# Patient Record
Sex: Female | Born: 1935
Health system: Southern US, Community
[De-identification: ages and names within clinical notes are randomized; demographics above are authoritative.]

## PROBLEM LIST (undated history)

## (undated) DIAGNOSIS — K589 Irritable bowel syndrome without diarrhea: Secondary | ICD-10-CM

## (undated) DIAGNOSIS — D126 Benign neoplasm of colon, unspecified: Secondary | ICD-10-CM

## (undated) DIAGNOSIS — M1990 Unspecified osteoarthritis, unspecified site: Secondary | ICD-10-CM

## (undated) DIAGNOSIS — E785 Hyperlipidemia, unspecified: Secondary | ICD-10-CM

## (undated) DIAGNOSIS — K219 Gastro-esophageal reflux disease without esophagitis: Secondary | ICD-10-CM

## (undated) DIAGNOSIS — Z860100 Personal history of colon polyps, unspecified: Secondary | ICD-10-CM

## (undated) DIAGNOSIS — K5790 Diverticulosis of intestine, part unspecified, without perforation or abscess without bleeding: Secondary | ICD-10-CM

## (undated) DIAGNOSIS — K649 Unspecified hemorrhoids: Secondary | ICD-10-CM

## (undated) DIAGNOSIS — K7689 Other specified diseases of liver: Secondary | ICD-10-CM

## (undated) DIAGNOSIS — I10 Essential (primary) hypertension: Secondary | ICD-10-CM

## (undated) HISTORY — DX: Personal history of colonic polyps: Z86.010

## (undated) HISTORY — DX: Irritable bowel syndrome, unspecified: K58.9

## (undated) HISTORY — DX: Hyperlipidemia, unspecified: E78.5

## (undated) HISTORY — PX: LUMBAR FUSION: SHX111

## (undated) HISTORY — PX: OTHER SURGICAL HISTORY: SHX169

## (undated) HISTORY — DX: Essential (primary) hypertension: I10

## (undated) HISTORY — DX: Other specified diseases of liver: K76.89

## (undated) HISTORY — DX: Benign neoplasm of colon, unspecified: D12.6

## (undated) HISTORY — DX: Gastro-esophageal reflux disease without esophagitis: K21.9

## (undated) HISTORY — DX: Unspecified hemorrhoids: K64.9

## (undated) HISTORY — DX: Personal history of colon polyps, unspecified: Z86.0100

## (undated) HISTORY — PX: TONSILLECTOMY: SUR1361

## (undated) HISTORY — PX: VAGINAL HYSTERECTOMY: SUR661

## (undated) HISTORY — DX: Diverticulosis of intestine, part unspecified, without perforation or abscess without bleeding: K57.90

## (undated) HISTORY — DX: Unspecified osteoarthritis, unspecified site: M19.90

---

## 1998-01-12 ENCOUNTER — Encounter: Admission: RE | Admit: 1998-01-12 | Discharge: 1998-02-07 | Payer: Self-pay | Admitting: Anesthesiology

## 1998-05-06 ENCOUNTER — Other Ambulatory Visit: Admission: RE | Admit: 1998-05-06 | Discharge: 1998-05-06 | Payer: Self-pay | Admitting: Obstetrics and Gynecology

## 1999-04-05 ENCOUNTER — Encounter: Payer: Self-pay | Admitting: Neurosurgery

## 1999-04-05 ENCOUNTER — Ambulatory Visit (HOSPITAL_COMMUNITY): Admission: RE | Admit: 1999-04-05 | Discharge: 1999-04-05 | Payer: Self-pay | Admitting: Neurosurgery

## 1999-06-26 ENCOUNTER — Encounter: Admission: RE | Admit: 1999-06-26 | Discharge: 1999-06-26 | Payer: Self-pay | Admitting: *Deleted

## 2000-02-23 ENCOUNTER — Encounter: Admission: RE | Admit: 2000-02-23 | Discharge: 2000-02-23 | Payer: Self-pay | Admitting: *Deleted

## 2000-05-01 ENCOUNTER — Encounter: Admission: RE | Admit: 2000-05-01 | Discharge: 2000-05-01 | Payer: Self-pay | Admitting: *Deleted

## 2000-06-01 ENCOUNTER — Encounter: Payer: Self-pay | Admitting: Neurosurgery

## 2000-06-01 ENCOUNTER — Ambulatory Visit (HOSPITAL_COMMUNITY): Admission: RE | Admit: 2000-06-01 | Discharge: 2000-06-01 | Payer: Self-pay | Admitting: Neurosurgery

## 2000-07-01 ENCOUNTER — Encounter: Payer: Self-pay | Admitting: Neurosurgery

## 2000-07-03 ENCOUNTER — Encounter: Payer: Self-pay | Admitting: Neurosurgery

## 2000-07-03 ENCOUNTER — Inpatient Hospital Stay (HOSPITAL_COMMUNITY): Admission: RE | Admit: 2000-07-03 | Discharge: 2000-07-07 | Payer: Self-pay | Admitting: Neurosurgery

## 2000-07-04 ENCOUNTER — Encounter: Payer: Self-pay | Admitting: Neurosurgery

## 2001-05-30 ENCOUNTER — Encounter (INDEPENDENT_AMBULATORY_CARE_PROVIDER_SITE_OTHER): Payer: Self-pay | Admitting: Specialist

## 2001-05-30 ENCOUNTER — Ambulatory Visit (HOSPITAL_COMMUNITY): Admission: RE | Admit: 2001-05-30 | Discharge: 2001-05-30 | Payer: Self-pay | Admitting: Gastroenterology

## 2004-07-12 ENCOUNTER — Ambulatory Visit: Payer: Self-pay | Admitting: Family Medicine

## 2004-07-19 ENCOUNTER — Ambulatory Visit: Payer: Self-pay | Admitting: Family Medicine

## 2004-12-02 ENCOUNTER — Ambulatory Visit: Payer: Self-pay | Admitting: Family Medicine

## 2004-12-18 ENCOUNTER — Ambulatory Visit: Payer: Self-pay | Admitting: Family Medicine

## 2005-01-11 ENCOUNTER — Ambulatory Visit: Payer: Self-pay | Admitting: Family Medicine

## 2005-04-23 ENCOUNTER — Ambulatory Visit: Payer: Self-pay | Admitting: Family Medicine

## 2006-06-04 ENCOUNTER — Ambulatory Visit: Payer: Self-pay | Admitting: Family Medicine

## 2006-06-04 LAB — CONVERTED CEMR LAB
Alkaline Phosphatase: 86 units/L (ref 39–117)
BUN: 16 mg/dL (ref 6–23)
Bilirubin, Direct: 0.1 mg/dL (ref 0.0–0.3)
CO2: 34 meq/L — ABNORMAL HIGH (ref 19–32)
Cholesterol: 178 mg/dL (ref 0–200)
GFR calc Af Amer: 62 mL/min
GFR calc non Af Amer: 51 mL/min
HCT: 41 % (ref 36.0–46.0)
HDL: 46.6 mg/dL (ref >39.0)
LDL Cholesterol: 98 mg/dL (ref 0–99)
MCHC: 34.1 g/dL (ref 30.0–36.0)
Platelets: 263 10*3/uL (ref 150–400)
Potassium: 3.1 meq/L — ABNORMAL LOW (ref 3.5–5.1)
RBC: 4.22 M/uL (ref 3.87–5.11)
Total CHOL/HDL Ratio: 3.8
Total Protein: 7.5 g/dL (ref 6.0–8.3)
Triglycerides: 167 mg/dL — ABNORMAL HIGH (ref 0–149)

## 2006-07-15 ENCOUNTER — Ambulatory Visit: Payer: Self-pay | Admitting: Family Medicine

## 2006-11-11 ENCOUNTER — Ambulatory Visit: Payer: Self-pay | Admitting: Family Medicine

## 2007-01-18 ENCOUNTER — Encounter: Payer: Self-pay | Admitting: Family Medicine

## 2007-01-27 DIAGNOSIS — E785 Hyperlipidemia, unspecified: Secondary | ICD-10-CM | POA: Insufficient documentation

## 2007-01-27 DIAGNOSIS — I10 Essential (primary) hypertension: Secondary | ICD-10-CM | POA: Insufficient documentation

## 2007-01-27 DIAGNOSIS — J309 Allergic rhinitis, unspecified: Secondary | ICD-10-CM | POA: Insufficient documentation

## 2007-03-19 ENCOUNTER — Telehealth: Payer: Self-pay | Admitting: Family Medicine

## 2007-04-16 ENCOUNTER — Ambulatory Visit: Payer: Self-pay | Admitting: Family Medicine

## 2007-04-16 DIAGNOSIS — K219 Gastro-esophageal reflux disease without esophagitis: Secondary | ICD-10-CM | POA: Insufficient documentation

## 2007-04-16 DIAGNOSIS — M1990 Unspecified osteoarthritis, unspecified site: Secondary | ICD-10-CM | POA: Insufficient documentation

## 2007-04-16 DIAGNOSIS — Z860100 Personal history of colon polyps, unspecified: Secondary | ICD-10-CM | POA: Insufficient documentation

## 2007-04-16 DIAGNOSIS — M545 Low back pain, unspecified: Secondary | ICD-10-CM | POA: Insufficient documentation

## 2007-04-16 DIAGNOSIS — K589 Irritable bowel syndrome without diarrhea: Secondary | ICD-10-CM | POA: Insufficient documentation

## 2007-08-27 ENCOUNTER — Encounter: Payer: Self-pay | Admitting: Family Medicine

## 2007-09-03 ENCOUNTER — Encounter: Payer: Self-pay | Admitting: Family Medicine

## 2007-11-25 ENCOUNTER — Ambulatory Visit: Payer: Self-pay | Admitting: Family Medicine

## 2007-11-26 LAB — CONVERTED CEMR LAB
Basophils Absolute: 0.1 10*3/uL (ref 0.0–0.1)
Bilirubin, Direct: 0.1 mg/dL (ref 0.0–0.3)
Calcium: 9.8 mg/dL (ref 8.4–10.5)
Cholesterol: 195 mg/dL (ref 0–200)
GFR calc Af Amer: 77 mL/min
GFR calc non Af Amer: 64 mL/min
HCT: 42.3 % (ref 36.0–46.0)
HDL: 53.3 mg/dL (ref >39.0)
Hemoglobin: 14.3 g/dL (ref 12.0–15.0)
LDL Cholesterol: 109 mg/dL — ABNORMAL HIGH (ref 0–99)
MCHC: 33.7 g/dL (ref 30.0–36.0)
Monocytes Absolute: 0.6 10*3/uL (ref 0.1–1.0)
Neutro Abs: 3.6 10*3/uL (ref 1.4–7.7)
RDW: 12.2 % (ref 11.5–14.6)
Sodium: 136 meq/L (ref 135–145)
Total Bilirubin: 0.8 mg/dL (ref 0.3–1.2)
Total Protein: 7.4 g/dL (ref 6.0–8.3)
Triglycerides: 166 mg/dL — ABNORMAL HIGH (ref 0–149)

## 2008-02-16 ENCOUNTER — Ambulatory Visit: Payer: Self-pay | Admitting: Family Medicine

## 2008-08-13 ENCOUNTER — Telehealth: Payer: Self-pay | Admitting: Family Medicine

## 2008-10-01 ENCOUNTER — Encounter: Payer: Self-pay | Admitting: Family Medicine

## 2009-01-17 ENCOUNTER — Ambulatory Visit: Payer: Self-pay | Admitting: Family Medicine

## 2009-01-17 LAB — CONVERTED CEMR LAB
Bilirubin Urine: NEGATIVE
Blood in Urine, dipstick: NEGATIVE
Glucose, Urine, Semiquant: NEGATIVE
Protein, U semiquant: NEGATIVE
pH: 7

## 2009-01-18 LAB — CONVERTED CEMR LAB
BUN: 21 mg/dL (ref 6–23)
Basophils Absolute: 0 10*3/uL (ref 0.0–0.1)
Cholesterol: 180 mg/dL (ref 0–200)
Eosinophils Absolute: 0.3 10*3/uL (ref 0.0–0.7)
GFR calc non Af Amer: 56.5 mL/min (ref >60)
Glucose, Bld: 106 mg/dL — ABNORMAL HIGH (ref 70–99)
HCT: 40.3 % (ref 36.0–46.0)
Lymphs Abs: 2 10*3/uL (ref 0.7–4.0)
MCHC: 35.3 g/dL (ref 30.0–36.0)
MCV: 95.4 fL (ref 78.0–100.0)
Monocytes Absolute: 0.4 10*3/uL (ref 0.1–1.0)
Monocytes Relative: 6.6 % (ref 3.0–12.0)
Platelets: 199 10*3/uL (ref 150.0–400.0)
Potassium: 3.7 meq/L (ref 3.5–5.1)
RDW: 12 % (ref 11.5–14.6)
TSH: 2.9 microintl units/mL (ref 0.35–5.50)
Total Bilirubin: 0.8 mg/dL (ref 0.3–1.2)
VLDL: 32 mg/dL (ref 0.0–40.0)

## 2009-02-17 ENCOUNTER — Telehealth: Payer: Self-pay | Admitting: Family Medicine

## 2009-02-23 ENCOUNTER — Telehealth: Payer: Self-pay | Admitting: Family Medicine

## 2009-10-31 ENCOUNTER — Encounter: Payer: Self-pay | Admitting: Family Medicine

## 2009-10-31 HISTORY — PX: COLONOSCOPY: SHX174

## 2009-11-11 ENCOUNTER — Encounter: Payer: Self-pay | Admitting: Family Medicine

## 2010-01-25 ENCOUNTER — Ambulatory Visit: Payer: Self-pay | Admitting: Family Medicine

## 2010-01-26 ENCOUNTER — Telehealth: Payer: Self-pay | Admitting: Family Medicine

## 2010-01-26 LAB — CONVERTED CEMR LAB
AST: 26 units/L (ref 0–37)
BUN: 18 mg/dL (ref 6–23)
Basophils Absolute: 0 10*3/uL (ref 0.0–0.1)
Calcium: 9.9 mg/dL (ref 8.4–10.5)
Cholesterol: 180 mg/dL (ref 0–200)
Eosinophils Absolute: 0.4 10*3/uL (ref 0.0–0.7)
GFR calc non Af Amer: 63.65 mL/min (ref >60)
Glucose, Bld: 92 mg/dL (ref 70–99)
HDL: 53.5 mg/dL (ref >39.00)
Lymphocytes Relative: 36 % (ref 12.0–46.0)
Lymphs Abs: 2.5 10*3/uL (ref 0.7–4.0)
MCHC: 34 g/dL (ref 30.0–36.0)
Monocytes Relative: 8.1 % (ref 3.0–12.0)
Platelets: 204 10*3/uL (ref 150.0–400.0)
RDW: 12.6 % (ref 11.5–14.6)
TSH: 3.39 microintl units/mL (ref 0.35–5.50)
Total Bilirubin: 0.6 mg/dL (ref 0.3–1.2)
Triglycerides: 189 mg/dL — ABNORMAL HIGH (ref 0.0–149.0)
VLDL: 37.8 mg/dL (ref 0.0–40.0)

## 2010-01-27 ENCOUNTER — Ambulatory Visit: Payer: Self-pay | Admitting: Family Medicine

## 2010-06-11 ENCOUNTER — Encounter: Payer: Self-pay | Admitting: Family Medicine

## 2010-06-22 NOTE — Procedures (Signed)
Summary: Colonoscopy Report/Dr. Sharrell Ku  Colonoscopy Report/Dr. Sharrell Ku   Imported By: Maryln Gottron 11/07/2009 13:10:47  _____________________________________________________________________  External Attachment:    Type:   Image     Comment:   External Document

## 2010-06-22 NOTE — Progress Notes (Signed)
Summary: needs additional test  Phone Note From Other Clinic   Caller: terri [facility]   Call For: DR Clent Ridges Summary of Call:  Converted from flag ---- ---- 01/26/2010 10:14 AM, Corky Mull wrote: Pt has an Appt on 9/23 with a Neurosurgeon.  They are needing a L-Spine MRI with and without contrast  and a L-Spine X-ray series before her appt.  If Dr. Clent Ridges approves send me an order for the MRI and I will set that up. And you can set up the X-ray.      Thanks. ---------- Initial call taken by: Pura Spice, RN,  January 26, 2010 10:32 AM  Follow-up for Phone Call        please order these for low back pain Follow-up by: Nelwyn Salisbury MD,  January 26, 2010 12:12 PM  Additional Follow-up for Phone Call Additional follow up Details #1::        referral sent to Terri Additional Follow-up by: Pura Spice, RN,  January 26, 2010 12:32 PM     Appended Document: L spine xr at [place]    Clinical Lists Changes  Orders: Added new Test order of T-Lumbar Spine Complete, 5 Views [phone]) - Signed

## 2010-06-22 NOTE — Assessment & Plan Note (Signed)
Summary: pt will come in fasting/njr   Vital Signs:  Patient profile:   77 year old female Height:      61 inches Weight:      159 pounds BMI:     30.15 O2 Sat:      95 % Temp:     97.7 degrees F Pulse rate:   58 / minute BP sitting:   130 / 82  (left arm) Cuff size:   regular  Vitals Entered By: Pura Spice, RN (January 25, 2010 8:59 AM) CC: cpx, fasting c/o tingling rt leg and left hip painful refills to [facility] Is Patient Diabetic? No   History of Present Illness: 77 yr old female for a cpx. She is doing well in general except for pain in the left lower back which radiates down the leg. No recent trauma. She had lumbar spine surgery 10 years ago, and her current pain remoinds her of what it felt like prior to her surgery. No numbness or weakness.   Allergies: 1)  ! Penicillin V Potassium (Penicillin V Potassium) 2)  ! Diphtheria-Tetanus Toxoids (Diphtheria-Tetanus Toxoids Dt) 3)  ! Pneumovax 23 (Pneumococcal Vac Polyvalent)  Past History:  Past Medical History: Reviewed history from 11/25/2007 and no changes required. Allergic rhinitis Hyperlipidemia Hypertension Colonic polyps, hx of GERD Osteoarthritis IBS  Past Surgical History: ganglion cyst removed left thumb  vaginal Hysterectomy with ovaries intact Lumbar fusion L3-4 and L4-5 per Dr. Channing Mutters 2002 Tonsillectomy Benign breast biopsy colonoscopy 10-31-09 per Dr. Kinnie Scales, benign polyp, no repeats recommended  Family History: Reviewed history from 04/16/2007 and no changes required. Family History of Arthritis Family History Diabetes 1st degree relative Family History Hypertension Family History Lung cancer Family History of Heart Disease  Social History: Reviewed history from 04/16/2007 and no changes required. Married Never Smoked Alcohol use-yes (wine) Drug use-no  Review of Systems  The patient denies anorexia, fever, weight loss, weight gain, vision loss, decreased hearing, hoarseness, chest  pain, syncope, dyspnea on exertion, peripheral edema, prolonged cough, headaches, hemoptysis, abdominal pain, melena, hematochezia, severe indigestion/heartburn, hematuria, incontinence, genital sores, muscle weakness, suspicious skin lesions, transient blindness, difficulty walking, depression, unusual weight change, abnormal bleeding, enlarged lymph nodes, angioedema, breast masses, and testicular masses.         Flu Vaccine Consent Questions     Do you have a history of severe allergic reactions to this vaccine? no    Any prior history of allergic reactions to egg and/or gelatin? no    Do you have a sensitivity to the preservative Thimersol? no    Do you have a past history of Guillan-Barre Syndrome? no    Do you currently have an acute febrile illness? no    Have you ever had a severe reaction to latex? no    Vaccine information given and explained to patient? yes    Are you currently pregnant? no    Lot Number:[id number]   Exp Date:11/17/2009   Site Given  Left Deltoid IM Pura Spice, RN  January 25, 2010 9:01 AM    Physical Exam  General:  overweight-appearing.   Head:  Normocephalic and atraumatic without obvious abnormalities. No apparent alopecia or balding. Eyes:  No corneal or conjunctival inflammation noted. EOMI. Perrla. Funduscopic exam benign, without hemorrhages, exudates or papilledema. Vision grossly normal. Ears:  External ear exam shows no significant lesions or deformities.  Otoscopic examination reveals clear canals, tympanic membranes are intact bilaterally without bulging, retraction, inflammation or discharge. Hearing is  grossly normal bilaterally. Nose:  External nasal examination shows no deformity or inflammation. Nasal mucosa are pink and moist without lesions or exudates. Mouth:  Oral mucosa and oropharynx without lesions or exudates.  Teeth in good repair. Neck:  No deformities, masses, or tenderness noted. Chest Wall:  No deformities, masses, or  tenderness noted. Breasts:  No mass, nodules, thickening, tenderness, bulging, retraction, inflamation, nipple discharge or skin changes noted.   Lungs:  Normal respiratory effort, chest expands symmetrically. Lungs are clear to auscultation, no crackles or wheezes. Heart:  Normal rate and regular rhythm. S1 and S2 normal without gallop, murmur, click, rub or other extra sounds. EKG normal Abdomen:  Bowel sounds positive,abdomen soft and non-tender without masses, organomegaly or hernias noted. Msk:  No deformity or scoliosis noted of thoracic or lumbar spine.   Pulses:  R and L carotid,radial,femoral,dorsalis pedis and posterior tibial pulses are full and equal bilaterally Extremities:  No clubbing, cyanosis, edema, or deformity noted with normal full range of motion of all joints.   Neurologic:  No cranial nerve deficits noted. Station and gait are normal. Plantar reflexes are down-going bilaterally. DTRs are symmetrical throughout. Sensory, motor and coordinative functions appear intact. Skin:  Intact without suspicious lesions or rashes Cervical Nodes:  No lymphadenopathy noted Axillary Nodes:  No palpable lymphadenopathy Inguinal Nodes:  No significant adenopathy Psych:  Cognition and judgment appear intact. Alert and cooperative with normal attention span and concentration. No apparent delusions, illusions, hallucinations   Impression & Recommendations:  Problem # 1:  BACK PAIN, LUMBAR (ICD-724.2)  Her updated medication list for this problem includes:    Aspir-low 81 Mg Tbec (Aspirin) .Marland Kitchen... 1 by mouth once daily    Vicodin 5-500 Mg Tabs (Hydrocodone-acetaminophen) .Marland Kitchen... 1 every 6 hours as needed pain    Butalbital-apap-caffeine 50-325-40 Mg Tabs (Butalbital-apap-caffeine) .Marland Kitchen... 1 every 6 hours as needed  Orders: Neurosurgeon Referral (Neurosurgeon)  Problem # 2:  IRRITABLE BOWEL SYNDROME (ICD-564.1)  Problem # 3:  OSTEOARTHRITIS (ICD-715.90)  Her updated medication list for  this problem includes:    Aspir-low 81 Mg Tbec (Aspirin) .Marland Kitchen... 1 by mouth once daily    Vicodin 5-500 Mg Tabs (Hydrocodone-acetaminophen) .Marland Kitchen... 1 every 6 hours as needed pain    Butalbital-apap-caffeine 50-325-40 Mg Tabs (Butalbital-apap-caffeine) .Marland Kitchen... 1 every 6 hours as needed  Problem # 4:  GERD (ICD-530.81)  Problem # 5:  HYPERTENSION (ICD-401.9)  Her updated medication list for this problem includes:    Isradipine 2.5 Mg Caps (Isradipine) .Marland Kitchen... Take 1 by mouth once daily    Metoprolol Tartrate 50 Mg Tabs (Metoprolol tartrate) .Marland Kitchen... 1 by mouth once daily    Diovan Hct 320-25 Mg Tabs (Valsartan-hydrochlorothiazide) .Marland Kitchen... Take 1 tablet by mouth once a day    Furosemide 20 Mg Tabs (Furosemide) ..... Once daily  Orders: EKG w/ Interpretation (93000) UA Dipstick w/o Micro (automated)  (81003) Venipuncture (16109) TLB-Lipid Panel (80061-LIPID) TLB-BMP (Basic Metabolic Panel-BMET) (80048-METABOL) TLB-CBC Platelet - w/Differential (85025-CBCD) TLB-Hepatic/Liver Function Pnl (80076-HEPATIC) TLB-TSH (Thyroid Stimulating Hormone) (84443-TSH) Specimen Handling (60454)  Problem # 6:  HYPERLIPIDEMIA (ICD-272.4)  Her updated medication list for this problem includes:    Zocor 40 Mg Tabs (Simvastatin) .Marland Kitchen... Take 1 tablet by mouth once a day  Complete Medication List: 1)  Isradipine 2.5 Mg Caps (Isradipine) .... Take 1 by mouth once daily 2)  Bl Calcium 500 500 Mg Tabs (Oyster shell) .Marland Kitchen.. 1 by mouth once daily 3)  Multivitamins Tabs (Multiple vitamin) .Marland Kitchen.. 1 by mouth once daily 4)  Metoprolol Tartrate 50 Mg Tabs (Metoprolol tartrate) .Marland Kitchen.. 1 by mouth once daily 5)  Diovan Hct 320-25 Mg Tabs (Valsartan-hydrochlorothiazide) .... Take 1 tablet by mouth once a day 6)  Zocor 40 Mg Tabs (Simvastatin) .... Take 1 tablet by mouth once a day 7)  Klor-con M20 20 Meq Tbcr (Potassium chloride crys cr) .Marland Kitchen.. 1 by mouth once daily 8)  Fish Oil Oil (Fish oil) .Marland Kitchen.. 1 by mouth once daily 9)  Aspir-low 81 Mg  Tbec (Aspirin) .Marland Kitchen.. 1 by mouth once daily 10)  Furosemide 20 Mg Tabs (Furosemide) .... Once daily 11)  Vicodin 5-500 Mg Tabs (Hydrocodone-acetaminophen) .Marland Kitchen.. 1 every 6 hours as needed pain 12)  Butalbital-apap-caffeine 50-325-40 Mg Tabs (Butalbital-apap-caffeine) .Marland Kitchen.. 1 every 6 hours as needed 13)  Allegra Otc  .... Once daily  Other Orders: Flu Vaccine 40yrs + (16109) Administration Flu vaccine - MCR (U0454)  Patient Instructions: 1)  It is important that you exercise reguarly at least 20 minutes 5 times a week. If you develop chest pain, have severe difficulty breathing, or feel very tired, stop exercising immediately and seek medical attention.  2)  You need to lose weight. Consider a lower calorie diet and regular exercise.  3)  Get fasting labs today.  4)  Refer to neurosurgery Prescriptions: BUTALBITAL-APAP-CAFFEINE 50-325-40 MG  TABS (BUTALBITAL-APAP-CAFFEINE) 1 every 6 hours as needed  #100 x 1   Entered and Authorized by:   Nelwyn Salisbury MD   Signed by:   Nelwyn Salisbury MD on 01/25/2010   Method used:   Print then Give to Patient   RxID:   [id number] VICODIN 5-500 MG  TABS (HYDROCODONE-ACETAMINOPHEN) 1 every 6 hours as needed pain  #360 x 1   Entered and Authorized by:   Nelwyn Salisbury MD   Signed by:   Nelwyn Salisbury MD on 01/25/2010   Method used:   Print then Give to Patient   RxID:   [id number] FUROSEMIDE 20 MG TABS (FUROSEMIDE) once daily  #90 x 3   Entered and Authorized by:   Nelwyn Salisbury MD   Signed by:   Nelwyn Salisbury MD on 01/25/2010   Method used:   Print then Give to Patient   RxID:   [id number] KLOR-CON M20 20 MEQ  TBCR (POTASSIUM CHLORIDE CRYS CR) 1 by mouth once daily  #90 x 3   Entered and Authorized by:   Nelwyn Salisbury MD   Signed by:   Nelwyn Salisbury MD on 01/25/2010   Method used:   Print then Give to Patient   RxID:   [id number] ZOCOR 40 MG  TABS (SIMVASTATIN) Take 1 tablet by mouth once a day  #90 x 3   Entered and Authorized by:    Nelwyn Salisbury MD   Signed by:   Nelwyn Salisbury MD on 01/25/2010   Method used:   Print then Give to Patient   RxID:   [id number] DIOVAN HCT 320-25 MG  TABS (VALSARTAN-HYDROCHLOROTHIAZIDE) Take 1 tablet by mouth once a day  #90 x 3   Entered and Authorized by:   Nelwyn Salisbury MD   Signed by:   Nelwyn Salisbury MD on 01/25/2010   Method used:   Print then Give to Patient   RxID:   [id number] METOPROLOL TARTRATE 50 MG  TABS (METOPROLOL TARTRATE) 1 by mouth once daily  #90 x 3   Entered and Authorized by:   Nelwyn Salisbury MD   Signed  by:   Nelwyn Salisbury MD on 01/25/2010   Method used:   Print then Give to Patient   RxID:   [id number] ISRADIPINE 2.5 MG  CAPS (ISRADIPINE) take 1 by mouth once daily  #90 x 3   Entered and Authorized by:   Nelwyn Salisbury MD   Signed by:   Nelwyn Salisbury MD on 01/25/2010   Method used:   Print then Give to Patient   RxID:   [id number]   Appended Document: Orders Update    Clinical Lists Changes  Observations: Added new observation of COMMENTS: Wynona Canes, CMA  January 25, 2010 12:13 PM  (01/25/2010 12:12) Added new observation of PH URINE: 7.5  (01/25/2010 12:12) Added new observation of SPEC GR URIN: 1.015  (01/25/2010 12:12) Added new observation of APPEARANCE U: Clear  (01/25/2010 12:12) Added new observation of UA COLOR: yellow  (01/25/2010 12:12) Added new observation of WBC DIPSTK U: negative  (01/25/2010 12:12) Added new observation of NITRITE URN: negative  (01/25/2010 12:12) Added new observation of UROBILINOGEN: 0.2  (01/25/2010 12:12) Added new observation of PROTEIN, URN: negative  (01/25/2010 12:12) Added new observation of BLOOD UR DIP: negative  (01/25/2010 12:12) Added new observation of KETONES URN: negative  (01/25/2010 12:12) Added new observation of BILIRUBIN UR: negative  (01/25/2010 12:12) Added new observation of GLUCOSE, URN: negative  (01/25/2010 12:12)      Laboratory Results   Urine  Tests  Date/Time Recieved: January 25, 2010 12:13 PM  Date/Time Reported: January 25, 2010 12:13 PM   Routine Urinalysis   Color: yellow Appearance: Clear Glucose: negative   (Normal Range: Negative) Bilirubin: negative   (Normal Range: Negative) Ketone: negative   (Normal Range: Negative) Spec. Gravity: 1.015   (Normal Range: 1.003-1.035) Blood: negative   (Normal Range: Negative) pH: 7.5   (Normal Range: 5.0-8.0) Protein: negative   (Normal Range: Negative) Urobilinogen: 0.2   (Normal Range: 0-1) Nitrite: negative   (Normal Range: Negative) Leukocyte Esterace: negative   (Normal Range: Negative)    Comments: Wynona Canes, CMA  January 25, 2010 12:13 PM

## 2010-10-06 NOTE — H&P (Signed)
[place]. [facility]  Patient:    Shirley Wright, Shirley Wright                    MRN: [id number] Adm. Date:  [id number] Attending:  Emeterio Reeve                         History and Physical  ADMITTING DIAGNOSIS:  Spondylosis and disk at L4-5.  SERVICE:  Neurosurgery.  HISTORY OF PRESENT ILLNESS:  This is a very nice 77 year old right-handed white lady that I have been following for a couple of years.  She has had back pain since 1998 radiating down her right leg.  We saw her in 1998, she underwent epidural steroids, did well, and saw her back in December with increasing pain in her back and her herniated disk.  She was restudied with an MRI that shows a foraminal disk at 4-5 and spondylolithesis grade 1 of a degenerative nature and she is now admitted for fusion.  MEDICAL HISTORY:  Unremarkable.  She has no significant diseases other than mild hypertension.  MEDICATIONS: 1. Multivitamin. 2. Calcium replacement. 3. Celebrex 200 mg a day. 4. Atarax 10 mg t.i.d. 5. Estradiol 0.5 mg a day. 6. Hydrochlorothiazide 12.5 mg twice a day. 7. Vicodin p.r.n.  ALLERGIES:  PENICILLIN, TETANUS, and PNEUMONIA VACCINE.  PAST SURGICAL HISTORY:  Unremarkable.  She had a tonsillectomy and a hysterectomy numerous years ago but nothing recent.  SOCIAL HISTORY:  She does not smoke or drink and she is a bookkeeper.  FAMILY HISTORY:  Mother died at 58, dad died at 35.  Dad had diabetes and mother had hypertension.  REVIEW OF SYSTEMS:  Remarkable for glasses, losing balance, sinus problems, hypercholesterolemia, leg pain, difficulty stopping her stream and occasional stress incontinence, leg weakness, back pain, joint pain, arthritis, difficulty with her memory.  PHYSICAL EXAMINATION:  HEENT:  Within normal limits.  NECK:  She has reasonable range of motion in her neck.  CHEST:  Clear.  CARDIAC:  Regular rate and rhythm with a 1/6 systolic murmur.  ABDOMEN:   Nontender, no hepatosplenomegaly.  EXTREMITIES:  Without clubbing or cyanosis.  GENITOURINARY:  Deferred.  PERIPHERAL PULSES:  Good.  NEUROLOGIC:  She is awake, alert, and oriented.  Her cranial nerves are intact.  Motor exam shows 5/5 strength throughout the upper and lower extremities.  Straight leg raise is positive.  Forward bending causes her leg pain.  There is no sensory deficit.  Reflexes are 2 at knees, absent at the ankles.  LABORATORY DATA:  MRI and plain film results have been reviewed above. Basically, she has degenerative spondylolithesis at 4-5 with a disk.  CLINICAL IMPRESSION:  Lumbar spondylosis with degenerative spondylolithesis.  PLAN:  Lumbar laminectomy, diskectomy, posterior lumbar interbody fusion at L4-5.  The risks and benefits of this approach have been discussed with her and she wishes to proceed. DD:  07/03/00 TD:  07/03/00 Job: [id number]

## 2010-10-06 NOTE — Discharge Summary (Signed)
[place]. [facility]  Patient:    Shirley Wright, Shirley Wright                    MRN: [id number] Adm. Date:  [id number] Disc. Date: [id number] Attending:  Emeterio Reeve                           Discharge Summary  ADMISSION DIAGNOSIS:  Spondylosis, L4-5.  DISCHARGE DIAGNOSIS:  Spondylosis, L4-5.  OPERATIVE PROCEDURES:  L4-5 laminectomy and diskectomy and posterior lumbar interbody fusion with Ray fusion cage.  COMPLICATIONS:  None.  DISCHARGE STATUS:  Alive and well.  HISTORY OF PRESENT ILLNESS:  This is a 77 year old, right-handed, white lady whose history and physical is recounted in the chart.  She had undergone conservative management for lumbar spondylosis to no avail.  She had instability and was admitted for a fusion.  PAST MEDICAL HISTORY:  Her medical history is unremarkable.  MEDICATIONS:  She uses Vioxx, Allegra, Estrace, calcium, Librax, and ibuprofen and talbutal on a p.r.n. basis for headache.  PAST SURGICAL HISTORY:  Tonsillectomy, hysterectomy, a thumb operation, and breast biopsy.  PHYSICAL EXAMINATION:  The general exam was unremarkable.  The neurologic exam had some pain and weakness in the hip flexures on the right and an L4 and S1 sensory deficit, although she did not have one on exam.  Reflexes were intact.  HOSPITAL COURSE:  She was admitted after ascertainment of normal laboratory values and underwent a PLIF at L4-5.  Postoperatively, she did extremely well. She was found to have a disk at L4-5 as well.  Her Foley was removed the first postoperative day.  Her nausea and vomiting, which was present one day, resolved the next.  She was up, eating and voiding normally.  Her strength is full.  Her incision is dry.  FOLLOW-UP:  Will be in the [facility] office in a week for sutures. DD:  08/30/00 TD:  08/30/00 Job: [id number]

## 2010-10-06 NOTE — Discharge Summary (Signed)
[place]. [facility]  Patient:    Shirley Wright, Shirley Wright                    MRN: [id number] Adm. Date:  [id number] Disc. Date: [id number] Attending:  Emeterio Reeve                           Discharge Summary  NO DICTATION DD:  09/18/00 TD:  09/19/00 Job: [id number]

## 2010-10-06 NOTE — Assessment & Plan Note (Signed)
[facility]                            [facility] OFFICE NOTE   Shirley Wright, Shirley Wright                    MRN:          [id number]  DATE:06/04/2006                            DOB:          [birth year]    This is a 77 year old woman here for a complete physical examination.  She has a couple of things to speak of.  First off, 3 weeks ago, a  tender knot came up on her left buttock.  It actually opened and drained  some bloody fluid for a few a days, then that stopped.  Then it seemed  to go away and no longer bothers her.  Also, over the last year, she has  had episodes where she has more swelling in her hands and feet than  usual.  She also has times when she gets dizzy and lightheaded when she  bends over, or when she stands up suddenly.  She does monitor her blood  pressure from time to time and it seems to have been doing well.  Otherwise, she had normal colonoscopy in January of 2006, she had normal  mammogram in February of 2007.  She does set up her own mammograms  yearly.  For other details of her past medical history, family history,  social history, habits, etc., refer to our last physical note dated  January 12, 2005.   ALLERGIES:  PENICILLIN, PNEUMONIA VACCINE AND TETANUS VACCINE.   CURRENT MEDICATIONS:  1. Calcium and vitamin D with multivitamins daily.  2. Metoprolol 50 mg once a day.  3. Aspirin 81 mg per day.  4. Diovan HCT 160/25 once a day.  5. Zocor 40 mg per day.  6. Lasix 20 mg per day.  7. DynaCirc 2.5 mg per day.  8. Allegra 180 mg once a day as needed.  9. Fiorinal as needed for headaches.   OBJECTIVE:  Height 5 foot 1 inch, weight 159, BP 142/82, pulse 69 and  regular.  GENERAL:  She remains at her baseline.  SKIN:  Clear except for large number of benign nevi and seborrheic  keratoses scattered over her body.  I see a small erythematous spot on  the left buttock which appears to be consistent with a resolving  sebaceous cyst.  EYES:  Clear, she wears glasses.  EARS:  Clear.  OROPHARYNX:  Clear.  NECK:  Supple without lymphadenopathy or masses.  LUNGS:  Clear.  Cardiac rate and rhythm regular without  gallops, murmurs, or rubs.  Distal pulses full. EKG is  within normal limits.  BREASTS AND AXILLAE:  Clear.  ABDOMEN:  Soft, normal bowel sounds, nontender, no masses.  RECTAL EXAM:  No masses or tenderness, stool hemoccult negative.  EXTREMITIES:  No  clubbing, cyanosis, or edema.  NEUROLOGIC EXAM:  Grossly intact.   ASSESSMENT/PLAN:  1. Complete physical.  She is fasting so will get the usual      laboratories today.  2. Hypertension, adequately controlled, but she probably has some      orthostasis, most likely from her DynaCirc.  Will stop DynaCirc and      increase Diovan HCT  to 320/25 to take once a day.  3. Allergies, stable.  4. Sebaceous cyst, apparently resolved.  5. Hyperlipidemia, will check a fasting lipid panel today.     Tera Mater. Clent Ridges, MD  Electronically Signed    SAF/MedQ  DD: 06/04/2006  DT: 06/04/2006  Job #: [id number]

## 2010-10-06 NOTE — Op Note (Signed)
[facility]  Patient:    Shirley Wright, Shirley Wright                    MRN: [id number] Proc. Date: 07/03/00 Adm. Date:  [id number] Attending:  Emeterio Reeve                           Operative Report  PREOPERATIVE DIAGNOSIS:  Spondylolisthesis at disk at L4-5.  PROCEDURE:  L4-5 laminectomy and diskectomy, posterior lumbar interbody fusion.  ANESTHESIA:  General endotracheal.  PREPARATION:  Sterile Betadine scrub and prep with alcohol wipe.  COMPLICATIONS:  None.  DESCRIPTION OF PROCEDURE:  This is a 77 year old right-handed lady with degenerative disk disease and spondylolisthesis at L4-5.  She was taken to the operating room, smoothly anesthetized and intubated and placed prone on the operating table.  Following shave, prep, and drape in the usual sterile fashion, skin was infiltrated with 1% lidocaine and 1:400,000 epinephrine. Skin was incised from the top of mid-L5 to the bottom of L3, and the laminae of L3, L4, and L5 were exposed bilaterally in the subperiosteal plane. Intraoperative x-ray showed the marker to be under L3.  The pars interarticularis, lamina, and inferior facet of L4 and the superior facet of L5 were removed bilaterally.  The L4 root and the L5 root were dissected free as they rounded their respective pedicles.  On both sides, there was significant disk material with a few remaining annular fibers in the anterior epidural space.  Diskectomy was carried out without difficulty, resulting in decompression of the anterior epidural space and relief of compression on the nerve roots.  Following this, 14 x 20 mm Ray Threaded Fusion Cages were placed bilaterally.  Intraoperative x-ray showed good placement of the cages.  They were packed with bone graft harvested from the facet joint and capped.  The wound was irrigated and hemostasis assured.  The fascia was reapproximated with 0 Vicryl in interrupted fashion, subcutaneous tissue was  reapproximated with 0 Vicryl in interrupted fashion, subcuticular tissue was reapproximated with 3-0 Vicryl in interrupted fashion.  The skin was closed with 3-0 nylon in a running locked fashion.  Betadine and Telfa dressing was applied and made occlusive with OpSite.  The patient then returned to the recovery room in good condition. DD:  07/03/00 TD:  07/03/00 Job: [id number]

## 2010-12-07 ENCOUNTER — Encounter: Payer: Self-pay | Admitting: Family Medicine

## 2010-12-29 ENCOUNTER — Telehealth: Payer: Self-pay | Admitting: Family Medicine

## 2010-12-29 NOTE — Telephone Encounter (Signed)
Pt is sick, has congestion and cough, hard time sleeping. Pt wants to have a prescription called in. Pt didn't want to come in to see the doctor.

## 2010-12-29 NOTE — Telephone Encounter (Signed)
She would need to be seen for this. I could see her Monday or else she could see the Saturday clinic

## 2011-01-01 ENCOUNTER — Ambulatory Visit (INDEPENDENT_AMBULATORY_CARE_PROVIDER_SITE_OTHER): Payer: Medicare Other

## 2011-01-01 ENCOUNTER — Inpatient Hospital Stay (INDEPENDENT_AMBULATORY_CARE_PROVIDER_SITE_OTHER)
Admission: RE | Admit: 2011-01-01 | Discharge: 2011-01-01 | Disposition: A | Discharge status: Home / Self Care | Payer: Medicare Other | Source: Ambulatory Visit | Attending: Family Medicine | Admitting: Family Medicine

## 2011-01-01 DIAGNOSIS — J189 Pneumonia, unspecified organism: Secondary | ICD-10-CM

## 2011-01-01 NOTE — Telephone Encounter (Signed)
Left voice message with below info.

## 2011-01-30 ENCOUNTER — Other Ambulatory Visit: Payer: Self-pay | Admitting: Family Medicine

## 2011-02-01 NOTE — Telephone Encounter (Signed)
Scripts approved and sent e-scribe.

## 2011-02-12 ENCOUNTER — Other Ambulatory Visit: Payer: Self-pay | Admitting: Family Medicine

## 2011-02-12 NOTE — Telephone Encounter (Signed)
Refill request for Vicodin 5-500 mg take 1 po q6hrs prn and pt last here on 06/11/10.

## 2011-02-12 NOTE — Telephone Encounter (Signed)
Pt called req refill for Hydrocodone(Vicodin) 5-500 mg 1 every 6hrs prn for pain to [facility].

## 2011-02-12 NOTE — Telephone Encounter (Signed)
Call in #[phone] with one rf 

## 2011-02-13 MED ORDER — HYDROCODONE-ACETAMINOPHEN 5-500 MG PO TABS: 1.0000 | ORAL_TABLET | Freq: Four times a day (QID) | ORAL | Status: DC | PRN

## 2011-02-13 NOTE — Telephone Encounter (Signed)
Script printed and faxed.

## 2011-02-21 ENCOUNTER — Encounter: Payer: Self-pay | Admitting: Family Medicine

## 2011-02-22 ENCOUNTER — Ambulatory Visit (INDEPENDENT_AMBULATORY_CARE_PROVIDER_SITE_OTHER): Payer: Medicare Other | Admitting: Family Medicine

## 2011-02-22 ENCOUNTER — Encounter: Payer: Self-pay | Admitting: Family Medicine

## 2011-02-22 VITALS — BP 120/80 | HR 71 | Temp 97.7°F | Ht 59.0 in | Wt 160.0 lb

## 2011-02-22 DIAGNOSIS — E785 Hyperlipidemia, unspecified: Secondary | ICD-10-CM

## 2011-02-22 DIAGNOSIS — K589 Irritable bowel syndrome without diarrhea: Secondary | ICD-10-CM

## 2011-02-22 DIAGNOSIS — M1990 Unspecified osteoarthritis, unspecified site: Secondary | ICD-10-CM

## 2011-02-22 DIAGNOSIS — Z136 Encounter for screening for cardiovascular disorders: Secondary | ICD-10-CM

## 2011-02-22 DIAGNOSIS — K219 Gastro-esophageal reflux disease without esophagitis: Secondary | ICD-10-CM

## 2011-02-22 DIAGNOSIS — Z0000 Encounter for general adult medical examination without abnormal findings: Secondary | ICD-10-CM

## 2011-02-22 DIAGNOSIS — Z23 Encounter for immunization: Secondary | ICD-10-CM

## 2011-02-22 DIAGNOSIS — I10 Essential (primary) hypertension: Secondary | ICD-10-CM

## 2011-02-22 LAB — POCT URINALYSIS DIPSTICK
Bilirubin, UA: NEGATIVE
Blood, UA: NEGATIVE
Glucose, UA: NEGATIVE
Ketones, UA: NEGATIVE
Leukocytes, UA: NEGATIVE
Nitrite, UA: NEGATIVE

## 2011-02-22 LAB — CBC WITH DIFFERENTIAL/PLATELET
Basophils Relative: 0.4 % (ref 0.0–3.0)
Eosinophils Relative: 6.1 % — ABNORMAL HIGH (ref 0.0–5.0)
Hemoglobin: 14.6 g/dL (ref 12.0–15.0)
Lymphocytes Relative: 33.9 % (ref 12.0–46.0)
Neutro Abs: 3.6 10*3/uL (ref 1.4–7.7)
Neutrophils Relative %: 52 % (ref 43.0–77.0)
RBC: 4.51 Mil/uL (ref 3.87–5.11)
WBC: 6.9 10*3/uL (ref 4.5–10.5)

## 2011-02-22 LAB — BASIC METABOLIC PANEL
Calcium: 9.7 mg/dL (ref 8.4–10.5)
GFR: 61.12 mL/min (ref >60.00)
Glucose, Bld: 97 mg/dL (ref 70–99)
Potassium: 3.3 mEq/L — ABNORMAL LOW (ref 3.5–5.1)
Sodium: 140 mEq/L (ref 135–145)

## 2011-02-22 LAB — HEPATIC FUNCTION PANEL
AST: 24 U/L (ref 0–37)
Albumin: 4.1 g/dL (ref 3.5–5.2)
Alkaline Phosphatase: 78 U/L (ref 39–117)
Total Bilirubin: 0.7 mg/dL (ref 0.3–1.2)

## 2011-02-22 LAB — LIPID PANEL
HDL: 59.3 mg/dL (ref >39.00)
LDL Cholesterol: 77 mg/dL (ref 0–99)
VLDL: 25.6 mg/dL (ref 0.0–40.0)

## 2011-02-22 NOTE — Progress Notes (Signed)
  Subjective:    Patient ID: Shirley Wright, female    DOB: 08/25/1925, 77 y.o.   MRN: [id number]  HPI 77 yr old female for a cpx. She feels fine in general and has no concerns. Her BP is stable. No chest pains or SOB. No bowel or bladder problems.    Review of Systems  Constitutional: Negative.   HENT: Negative.   Eyes: Negative.   Respiratory: Negative.   Cardiovascular: Negative.   Gastrointestinal: Negative.   Genitourinary: Negative for dysuria, urgency, frequency, hematuria, flank pain, decreased urine volume, enuresis, difficulty urinating, pelvic pain and dyspareunia.  Musculoskeletal: Negative.   Skin: Negative.   Neurological: Negative.   Hematological: Negative.   Psychiatric/Behavioral: Negative.        Objective:   Physical Exam  Constitutional: She is oriented to person, place, and time. She appears well-developed and well-nourished. No distress.  HENT:  Head: Normocephalic and atraumatic.  Right Ear: External ear normal.  Left Ear: External ear normal.  Nose: Nose normal.  Mouth/Throat: Oropharynx is clear and moist. No oropharyngeal exudate.  Eyes: Conjunctivae and EOM are normal. Pupils are equal, round, and reactive to light. No scleral icterus.  Neck: Normal range of motion. Neck supple. No JVD present. No thyromegaly present.  Cardiovascular: Normal rate, regular rhythm, normal heart sounds and intact distal pulses.  Exam reveals no gallop and no friction rub.   No murmur heard.      EKG normal   Pulmonary/Chest: Effort normal and breath sounds normal. No respiratory distress. She has no wheezes. She has no rales. She exhibits no tenderness.  Abdominal: Soft. Bowel sounds are normal. She exhibits no distension and no mass. There is no tenderness. There is no rebound and no guarding.  Musculoskeletal: Normal range of motion. She exhibits no edema and no tenderness.  Lymphadenopathy:    She has no cervical adenopathy.  Neurological: She is alert and  oriented to person, place, and time. She has normal reflexes. No cranial nerve deficit. She exhibits normal muscle tone. Coordination normal.  Skin: Skin is warm and dry. No rash noted. No erythema.  Psychiatric: She has a normal mood and affect. Her behavior is normal. Judgment and thought content normal.          Assessment & Plan:  Well exam. Get fasting labs today

## 2011-03-01 ENCOUNTER — Telehealth: Payer: Self-pay | Admitting: Family Medicine

## 2011-03-01 NOTE — Telephone Encounter (Signed)
Message copied by Baldemar Friday on Thu Mar 01, 2011  3:36 PM ------      Message from: Gershon Crane A      Created: Sun Feb 25, 2011  5:47 PM       Normal except mildly low potassium. Drink plenty of orange juice and eat bananas and greens.

## 2011-03-01 NOTE — Telephone Encounter (Signed)
Spoke with pt and gave results. 

## 2011-04-09 ENCOUNTER — Telehealth: Payer: Self-pay | Admitting: Family Medicine

## 2011-04-09 NOTE — Telephone Encounter (Signed)
Refill ventolin hfa inhaler [facility]. Thanks.

## 2011-04-11 MED ORDER — ALBUTEROL SULFATE HFA 108 (90 BASE) MCG/ACT IN AERS: 2.0000 | INHALATION_SPRAY | Freq: Four times a day (QID) | RESPIRATORY_TRACT | Status: DC | PRN

## 2011-04-11 NOTE — Telephone Encounter (Signed)
rx sent to [facility] [address].  Will call [facility] to cancel order.

## 2011-07-06 ENCOUNTER — Telehealth: Payer: Self-pay | Admitting: Family Medicine

## 2011-07-06 MED ORDER — ALBUTEROL SULFATE HFA 108 (90 BASE) MCG/ACT IN AERS: 2.0000 | INHALATION_SPRAY | Freq: Four times a day (QID) | RESPIRATORY_TRACT | Status: DC | PRN

## 2011-07-06 MED ORDER — POTASSIUM CHLORIDE CRYS ER 20 MEQ PO TBCR: 20.0000 meq | EXTENDED_RELEASE_TABLET | Freq: Every day | ORAL | Status: DC

## 2011-07-06 NOTE — Telephone Encounter (Signed)
Scripts sent e-scribe to [facility]

## 2011-08-21 ENCOUNTER — Telehealth: Payer: Self-pay | Admitting: Family Medicine

## 2011-08-21 NOTE — Telephone Encounter (Signed)
Refill request for Vicodin 5-500 mg take 1 po q6hrs prn and pt last here on 02/22/11.

## 2011-08-21 NOTE — Telephone Encounter (Signed)
Dr Clent Ridges out of office  Can RX  60 pills   .  Further refills given by Dr Clent Ridges

## 2011-08-22 MED ORDER — HYDROCODONE-ACETAMINOPHEN 5-500 MG PO TABS: 1.0000 | ORAL_TABLET | Freq: Four times a day (QID) | ORAL | Status: DC | PRN

## 2011-08-22 NOTE — Telephone Encounter (Signed)
Script called in and spoke with pt. 

## 2011-08-24 ENCOUNTER — Telehealth: Payer: Self-pay | Admitting: Family Medicine

## 2011-08-24 NOTE — Telephone Encounter (Signed)
Refill request for Vicodin 5-500 mg take 1 po q6hrs prn and a 90 day supply to [facility]. I called in # [phone] locally per Dr. Fabian Sharp and pt is aware that we will place the mail order next week, if approved.

## 2011-08-26 NOTE — Telephone Encounter (Signed)
Call in #[phone] with one rf 

## 2011-08-27 ENCOUNTER — Telehealth: Payer: Self-pay | Admitting: Family Medicine

## 2011-08-27 MED ORDER — POTASSIUM CHLORIDE CRYS ER 20 MEQ PO TBCR: 20.0000 meq | EXTENDED_RELEASE_TABLET | Freq: Every day | ORAL | Status: DC

## 2011-08-27 MED ORDER — ALBUTEROL SULFATE HFA 108 (90 BASE) MCG/ACT IN AERS: 2.0000 | INHALATION_SPRAY | Freq: Four times a day (QID) | RESPIRATORY_TRACT | Status: DC | PRN

## 2011-08-27 NOTE — Telephone Encounter (Signed)
Scripts sent e-scribe for Inhaler & Klor-Con, per pt request and send to [facility].

## 2011-08-28 MED ORDER — HYDROCODONE-ACETAMINOPHEN 5-500 MG PO TABS: 1.0000 | ORAL_TABLET | Freq: Four times a day (QID) | ORAL | Status: DC | PRN

## 2011-09-18 ENCOUNTER — Ambulatory Visit (INDEPENDENT_AMBULATORY_CARE_PROVIDER_SITE_OTHER): Payer: Medicare Other | Admitting: Family Medicine

## 2011-09-18 ENCOUNTER — Encounter: Payer: Self-pay | Admitting: Family Medicine

## 2011-09-18 VITALS — BP 120/78 | HR 62 | Temp 98.4°F | Wt 152.0 lb

## 2011-09-18 DIAGNOSIS — R1032 Left lower quadrant pain: Secondary | ICD-10-CM

## 2011-09-18 DIAGNOSIS — K5732 Diverticulitis of large intestine without perforation or abscess without bleeding: Secondary | ICD-10-CM

## 2011-09-18 LAB — POCT URINALYSIS DIPSTICK
Blood, UA: NEGATIVE
Protein, UA: NEGATIVE
Spec Grav, UA: 1.015
Urobilinogen, UA: 0.2

## 2011-09-18 MED ORDER — CIPROFLOXACIN HCL 500 MG PO TABS: 500.0000 mg | ORAL_TABLET | Freq: Two times a day (BID) | ORAL | Status: DC

## 2011-09-18 NOTE — Progress Notes (Signed)
  Subjective:    Patient ID: Shirley Wright, female    DOB: 12/07/1925, 78 y.o.   MRN: [id number]  HPI Here for 3 months of intermittent frequent stools and urgency of BMs. She may have 3 or 4 BMs in the span of one hour. They can be loose but are mostly well formed. No bloody or dark stools. Also she may have some urinary urgency during this times. No fevers. She has slight nausea but never vomits.    Review of Systems  Constitutional: Negative.   Respiratory: Negative.   Cardiovascular: Negative.   Gastrointestinal: Positive for nausea and abdominal pain. Negative for vomiting, diarrhea, constipation, blood in stool, abdominal distention and rectal pain.  Genitourinary: Positive for urgency and frequency. Negative for hematuria, flank pain and pelvic pain.       Objective:   Physical Exam  Constitutional: She appears well-developed and well-nourished.  Abdominal: Soft. Bowel sounds are normal. She exhibits no distension and no mass. There is no rebound and no guarding.       Mildly tender in the LLQ          Assessment & Plan:  Drink fluids and get plenty of fiber.

## 2011-09-18 NOTE — Progress Notes (Signed)
Addended by: Aniceto Boss A on: 09/18/2011 05:37 PM   Modules accepted: Orders

## 2011-10-10 ENCOUNTER — Telehealth: Payer: Self-pay | Admitting: Family Medicine

## 2011-10-10 MED ORDER — METOPROLOL TARTRATE 50 MG PO TABS: 50.0000 mg | ORAL_TABLET | Freq: Every day | ORAL | Status: DC

## 2011-10-10 MED ORDER — VALSARTAN-HYDROCHLOROTHIAZIDE 320-25 MG PO TABS: 1.0000 | ORAL_TABLET | Freq: Every day | ORAL | Status: DC

## 2011-10-10 MED ORDER — ISRADIPINE 2.5 MG PO CAPS: 2.5000 mg | ORAL_CAPSULE | Freq: Every day | ORAL | Status: DC

## 2011-10-10 NOTE — Telephone Encounter (Signed)
Refill request for Metoprolol, Valsart/HCTZ, Isradipine and 90 day supply to [facility]. I sent in all 3 scripts e-scribe.

## 2011-10-11 ENCOUNTER — Other Ambulatory Visit: Payer: Self-pay | Admitting: Family Medicine

## 2011-11-15 ENCOUNTER — Telehealth: Payer: Self-pay | Admitting: Family Medicine

## 2011-11-15 MED ORDER — SIMVASTATIN 40 MG PO TABS: 40.0000 mg | ORAL_TABLET | Freq: Every day | ORAL | Status: DC

## 2011-11-15 NOTE — Telephone Encounter (Signed)
Refill request for Simvastatin and I sent e-scribe to [facility].

## 2011-11-30 ENCOUNTER — Other Ambulatory Visit: Payer: Self-pay | Admitting: Family Medicine

## 2011-12-10 ENCOUNTER — Encounter: Payer: Self-pay | Admitting: Family Medicine

## 2011-12-31 ENCOUNTER — Other Ambulatory Visit: Payer: Self-pay | Admitting: Family Medicine

## 2011-12-31 NOTE — Telephone Encounter (Signed)
Last seen 09/18/11 diveticulitis Last written 11/20/11 #20 0RF Please advise

## 2012-01-03 ENCOUNTER — Telehealth: Payer: Self-pay

## 2012-01-03 NOTE — Telephone Encounter (Signed)
Opened in error

## 2012-01-10 ENCOUNTER — Telehealth: Payer: Self-pay | Admitting: Family Medicine

## 2012-01-10 NOTE — Telephone Encounter (Signed)
Caller: Alexcia/Patient; Patient Name: Shirley Wright; PCP: Nelwyn Salisbury.; Best Callback Phone Number: [phone]; Reason for call: Other       Diagnosed with Diverticulosis 3-4 months ago.  Given Cipro with 5 refills.  Has used up all her refills taking the medication 7 days at a time, office denied refills this last request.  Has been having abdominal pain, cramping on random mornings after 2-3 stools.  Was bad yesterday 8/21 and this morning 8/22 had some pain relieved after one BM then pain stopped.  If pain free currently, afebrile.  Wanting refill of antibiotic or another one called in since it is not completely getting rid of her symptoms.  Triaged in Abdominal Pain Guideline - Disposition:  See Provider Within 72 Hours due to all other situations - repeated episodes of abdominal discomfort, frequent antibiotics.  Lives at distance from office.  Appointment with Dr Abran Cantor at 1 pm tomorrow.

## 2012-01-11 ENCOUNTER — Ambulatory Visit (INDEPENDENT_AMBULATORY_CARE_PROVIDER_SITE_OTHER): Payer: Medicare Other | Admitting: Family Medicine

## 2012-01-11 ENCOUNTER — Encounter: Payer: Self-pay | Admitting: Family Medicine

## 2012-01-11 VITALS — BP 120/76 | HR 96 | Temp 98.2°F | Wt 150.0 lb

## 2012-01-11 DIAGNOSIS — R109 Unspecified abdominal pain: Secondary | ICD-10-CM

## 2012-01-11 DIAGNOSIS — R1013 Epigastric pain: Secondary | ICD-10-CM

## 2012-01-11 LAB — POCT URINALYSIS DIPSTICK
Blood, UA: NEGATIVE
Ketones, UA: NEGATIVE
Protein, UA: NEGATIVE
Spec Grav, UA: 1.01
Urobilinogen, UA: 0.2
pH, UA: 7.5

## 2012-01-11 MED ORDER — BUTALBITAL-ASPIRIN-CAFFEINE 50-325-40 MG PO TABS: 1.0000 | ORAL_TABLET | Freq: Four times a day (QID) | ORAL | Status: DC | PRN

## 2012-01-11 NOTE — Addendum Note (Signed)
Addended by: Aniceto Boss A on: 01/11/2012 02:34 PM   Modules accepted: Orders

## 2012-01-11 NOTE — Progress Notes (Signed)
  Subjective:    Patient ID: Shirley Wright, female    DOB: June 13, 1925, 78 y.o.   MRN: [id number]  HPI Here for an episode of epigastric pain last night which started 15 minutes after eating peanut butter crackers and drinking milk. The pains were sharp and quite severe, and they came is waves lasting 2-3 minutes at a time. After 20 minutes they went away. She was very nauseated but did not vomit. No fever. She had a normal BM last night. No urinary symptoms. She has been having more heartburn than usual for the past month or so. Today she feels fine.   Review of Systems  Constitutional: Negative.   Respiratory: Negative.   Cardiovascular: Negative.   Gastrointestinal: Positive for nausea and abdominal pain. Negative for vomiting, diarrhea, constipation, blood in stool, abdominal distention and rectal pain.  Genitourinary: Negative.        Objective:   Physical Exam  Constitutional: She appears well-developed and well-nourished.  Cardiovascular: Normal rate, regular rhythm, normal heart sounds and intact distal pulses.   Pulmonary/Chest: Effort normal and breath sounds normal.  Abdominal: Soft. Bowel sounds are normal. She exhibits no distension and no mass. There is no tenderness. There is no rebound and no guarding.          Assessment & Plan:  This sounds like gall bladder disease so we will set up an abdominal US soon. Advised her to avoid fatty foods, meats, and dairy products for the time being.

## 2012-01-17 ENCOUNTER — Ambulatory Visit
Admission: RE | Admit: 2012-01-17 | Discharge: 2012-01-17 | Disposition: A | Discharge status: Home / Self Care | Payer: Medicare Other | Source: Ambulatory Visit | Attending: Family Medicine | Admitting: Family Medicine

## 2012-01-17 DIAGNOSIS — R1013 Epigastric pain: Secondary | ICD-10-CM

## 2012-01-18 NOTE — Progress Notes (Signed)
Quick Note:  I spoke with pt ______ 

## 2012-01-23 ENCOUNTER — Encounter: Payer: Self-pay | Admitting: Internal Medicine

## 2012-01-29 ENCOUNTER — Encounter: Payer: Self-pay | Admitting: Internal Medicine

## 2012-01-30 ENCOUNTER — Ambulatory Visit (INDEPENDENT_AMBULATORY_CARE_PROVIDER_SITE_OTHER): Payer: Medicare Other | Admitting: Internal Medicine

## 2012-01-30 ENCOUNTER — Encounter: Payer: Self-pay | Admitting: Internal Medicine

## 2012-01-30 VITALS — BP 100/60 | HR 58 | Ht 60.0 in | Wt 147.8 lb

## 2012-01-30 DIAGNOSIS — K5790 Diverticulosis of intestine, part unspecified, without perforation or abscess without bleeding: Secondary | ICD-10-CM

## 2012-01-30 DIAGNOSIS — K5730 Diverticulosis of large intestine without perforation or abscess without bleeding: Secondary | ICD-10-CM

## 2012-01-30 DIAGNOSIS — R109 Unspecified abdominal pain: Secondary | ICD-10-CM

## 2012-01-30 DIAGNOSIS — R634 Abnormal weight loss: Secondary | ICD-10-CM

## 2012-01-30 DIAGNOSIS — R1030 Lower abdominal pain, unspecified: Secondary | ICD-10-CM

## 2012-01-30 MED ORDER — POLYETHYLENE GLYCOL 3350 17 GM/SCOOP PO POWD: 17.0000 g | Freq: Every day | ORAL | Status: AC

## 2012-01-30 NOTE — Patient Instructions (Addendum)
Your physician has requested that you go to the basement for lab work before leaving today.  You have been scheduled for a CT scan of the abdomen and pelvis at [facility] ([address]---this is in the same building as [facility]).   You are scheduled on 02/04/2012 at 11:00am. You should arrive 15 minutes prior to your appointment time for registration. Please follow the written instructions below on the day of your exam:  WARNING: IF YOU ARE ALLERGIC TO IODINE/X-RAY DYE, PLEASE NOTIFY RADIOLOGY IMMEDIATELY AT [phone]! YOU WILL BE GIVEN A 13 HOUR PREMEDICATION PREP.  1) Do not eat or drink anything after 7:00am (4 hours prior to your test) 2) You have been given 2 bottles of oral contrast to drink. The solution may taste better if refrigerated, but do NOT add ice or any other liquid to this solution. Shake well before drinking.    Drink 1 bottle of contrast @ 9:00am (2 hours prior to your exam)  Drink 1 bottle of contrast @ 10:00am (1 hour prior to your exam)  You may take any medications as prescribed with a small amount of water except for the following: Metformin, Glucophage, Glucovance, Avandamet, Riomet, Fortamet, Actoplus Met, Janumet, Glumetza or Metaglip. The above medications must be held the day of the exam AND 48 hours after the exam.  The purpose of you drinking the oral contrast is to aid in the visualization of your intestinal tract. The contrast solution may cause some diarrhea. Before your exam is started, you will be given a small amount of fluid to drink. Depending on your individual set of symptoms, you may also receive an intravenous injection of x-ray contrast/dye. Plan on being at [facility] for 30 minutes or long, depending on the type of exam you are having performed.  If you have any questions regarding your exam or if you need to reschedule, you may call the CT department at [phone] between the hours of 8:00 am and 5:00 pm,  Monday-Friday.  ________________________________________________________________________

## 2012-01-30 NOTE — Progress Notes (Signed)
Patient ID: Shirley Wright, female   DOB: [birth year], 78 y.o.   MRN: [id number]  SUBJECTIVE: HPI Shirley Wright is an 78 yo female with PMH of hypertension, hyperlipidemia, GERD, osteoarthritis, IBS, diverticulosis and possible diverticulitis, who is seen in consultation at the request of Dr. Clent Ridges for evaluation of lower abdominal pain.  The patient is alone today. The patient reports her abdominal complaints date back to February 2013. She describes issues with lower abdominal pain and more frequent bowel movement. She reports when her symptoms are bothersome she has early morning lower abdominal cramping pain along with 2-3 stools in the morning. With this she overall has "felt bad" along with fatigue and feeling tired. She reports she will wake up in attempt to have a hard stool which is often very small, and then has 2-3 additional episodes of stool eventually ending with soft stool. Along with the stool she has lower abdominal cramping pain. She reports having cycles of this over the last several months and has been treated 3-4 times with courses of ciprofloxacin. She reports his antibiotic his help every time, and as long as she is taking them she is well. She reports resolution of her lower abdominal pain and less issues passing stool. She feels her last course of antibiotics was 3 weeks ago. She reports a good appetite, but reports having a hard time eating. She's noted some mild nausea but no vomiting. She denies fevers and chills. She does report a hard stool but denies diarrhea. No rectal bleeding or melena. She does report one day of epigastric abdominal pain and for that she saw Dr. Clent Ridges. A gallbladder ultrasound was ordered but she reports this was negative. She has not had any further epigastric abdominal pain. She denies fevers and chills and no night sweats. She has lost about 15 pounds since January.  Review of Systems  As per history of present illness, otherwise negative   Past Medical  History  Diagnosis Date  . Allergic rhinitis   . Hyperlipidemia   . Hypertension   . History of colonic polyps   . GERD (gastroesophageal reflux disease)   . Osteoarthritis   . IBS (irritable bowel syndrome)     Current Outpatient Prescriptions  Medication Sig Dispense Refill  . albuterol (PROVENTIL HFA;VENTOLIN HFA) 108 (90 BASE) MCG/ACT inhaler Inhale 2 puffs into the lungs every 6 (six) hours as needed.  3 Inhaler  3  . aspirin 81 MG tablet Take 81 mg by mouth daily.        . butalbital-aspirin-caffeine (FIORINAL) 50-325-40 MG per tablet Take 1 tablet by mouth every 6 (six) hours as needed for headache.  60 tablet  5  . Calcium Carbonate (CALCIUM 500 PO) Take by mouth.        . Fexofenadine HCl (ALLEGRA PO) Take by mouth.        . furosemide (LASIX) 20 MG tablet TAKE 1 TABLET DAILY  90 tablet  3  . HYDROcodone-acetaminophen (VICODIN) 5-500 MG per tablet Take 1 tablet by mouth every 6 (six) hours as needed for pain.  360 tablet  1  . isradipine (DYNACIRC) 2.5 MG capsule Take 1 capsule (2.5 mg total) by mouth daily.  90 capsule  3  . metoprolol (LOPRESSOR) 50 MG tablet Take 1 tablet (50 mg total) by mouth daily.  90 tablet  3  . potassium chloride SA (K-DUR,KLOR-CON) 20 MEQ tablet Take 1 tablet (20 mEq total) by mouth daily.  90 tablet  3  . simvastatin (  ZOCOR) 40 MG tablet Take 1 tablet (40 mg total) by mouth at bedtime.  90 tablet  3  . valsartan-hydrochlorothiazide (DIOVAN HCT) 320-25 MG per tablet Take 1 tablet by mouth daily.  90 tablet  3  . polyethylene glycol powder (GLYCOLAX/MIRALAX) powder Take 17 g by mouth daily.  255 g  3    Allergies  Allergen Reactions  . Penicillins     REACTION: unspecified  . Pneumococcal Vaccine Polyvalent     REACTION: unspecified  . Tetanus-Diphtheria Toxoids Td     REACTION: unspecified    Family History  Problem Relation Age of Onset  . Arthritis    . Diabetes    . Hypertension    . Lung cancer    . Heart disease      History    Substance Use Topics  . Smoking status: Never Smoker   . Smokeless tobacco: Never Used  . Alcohol Use: No     once every 2-3 months    OBJECTIVE: BP 100/60  Pulse 58  Ht 5' (1.524 m)  Wt 147 lb 12.8 oz (67.042 kg)  BMI 28.87 kg/m2 Constitutional: Well-developed and well-nourished. No distress. HEENT: Normocephalic and atraumatic. Oropharynx is clear and moist. No oropharyngeal exudate. Conjunctivae are normal. No scleral icterus. Neck: Neck supple. Trachea midline. Cardiovascular: Normal rate, regular rhythm and intact distal pulses. No M/R/G Pulmonary/chest: Effort normal and breath sounds normal. No wheezing, rales or rhonchi. Abdominal: Soft, mild lower and suprapubic abdominal pain without rebound or guarding, mild distention. Bowel sounds active throughout. There are no masses palpable. No hepatosplenomegaly. Extremities: no clubbing, cyanosis, or edema Lymphadenopathy: No cervical adenopathy noted. Neurological: Alert and oriented to person place and time. Skin: Skin is warm and dry. No rashes noted. Psychiatric: Normal mood and affect. Behavior is normal.  Labs and Imaging -- CBC    Component Value Date/Time   WBC 6.9 02/22/2011 1127   RBC 4.51 02/22/2011 1127   HGB 14.6 02/22/2011 1127   HCT 43.9 02/22/2011 1127   PLT 226.0 02/22/2011 1127   MCV 97.4 02/22/2011 1127   MCHC 33.2 02/22/2011 1127   RDW 13.4 02/22/2011 1127   LYMPHSABS 2.4 02/22/2011 1127   MONOABS 0.5 02/22/2011 1127   EOSABS 0.4 02/22/2011 1127   BASOSABS 0.0 02/22/2011 1127    CMP     Component Value Date/Time   NA 140 02/22/2011 1127   K 3.3* 02/22/2011 1127   CL 100 02/22/2011 1127   CO2 31 02/22/2011 1127   GLUCOSE 97 02/22/2011 1127   BUN 27* 02/22/2011 1127   CREATININE 0.9 02/22/2011 1127   CALCIUM 9.7 02/22/2011 1127   PROT 7.5 02/22/2011 1127   ALBUMIN 4.1 02/22/2011 1127   AST 24 02/22/2011 1127   ALT 18 02/22/2011 1127   ALKPHOS 78 02/22/2011 1127   BILITOT 0.7 02/22/2011 1127   GFRNONAA 63.65  01/25/2010 0931   GFRAA 77 11/25/2007 1119   COMPLETE ABDOMINAL ULTRASOUND -- 01/18/12   Comparison:  None.   Findings:   Gallbladder:  No gallstones, gallbladder wall thickening, or pericholecystic fluid.   Common bile duct:  Upper limits of normal at 7 mm.  This is within normal limits for patient's age.   Liver:  There is a cluster of adjacent anechoic cysts within the left hepatic lobe which have through transmission are likely benign simple hepatic cysts measuring up to 2 cm.   IVC:  Appears normal.   Pancreas:  No focal abnormality seen.   Spleen:  Normal in size and echogenicity.   Right Kidney:  9.5cm in length.   No hydronephrosis or mass.   Left Kidney:  9.7cm in length.  No evidence of hydronephrosis or stones. Small 7 mm cyst appears simple.   Abdominal aorta:  No aneurysm identified.   IMPRESSION:   1.  Normal gallbladder. 2.  Common bile duct is mildly dilated which likely relates to patient's age. 3.  Probable benign hepatic cysts. 4.  No significant renal abnormality.   Colonoscopy -- performed by Dr. Kinnie Scales, 10/31/2009 -- diminutive right colon polyp excised by biopsy, extensive rectosigmoid diverticulosis, internal hemorrhoids. Visualization of the cecum with good prep and "excellent visualization"throughout.  ASSESSMENT AND PLAN: 78 yo female with PMH of hypertension, hyperlipidemia, GERD, osteoarthritis, IBS, diverticulosis and possible diverticulitis, who is seen in consultation at the request of Dr. Clent Ridges for evaluation of lower abdominal pain.  1. Lower abd pain -- the patient has had off-and-on issues with lower abdominal pain and hard stool over the last several months. She is up-to-date on polyp surveillance standpoint with her last colonoscopy being 2 years ago. She did have a very tight segment in the rectosigmoid colon with extensive diverticulosis. It is possible that she has had low-grade diverticulitis, but also possible that she is having  constipation and difficult time passing stool through this narrowed segment of diverticulosis in the rectosigmoid colon. Her weight loss is significant, without a clear explanation. It is interesting that she has responded to ciprofloxacin each time. Given her overall clinical picture, I recommended cross-sectional imaging with CT scan. We will perform CBC and CMP first. I also think that softening her stools with the laxative may help prevent lower abdominal cramping pain as it relates to her diverticulosis. I recommended MiraLAX 17 g daily, if her stools are too loose she can use this every other day. We will contact her once results of the CT are available, and I will see her in one month to reassess her symptoms.  2. Epigastric pain -- this resolved and only lasted one day. She saw Dr. Clent Ridges on this day an ultrasound was performed. She will let us know if this recurs.  Most of her symptoms remain lower, see #1  3. Hx of colon polyps -- repeat surveillance colonoscopy will be deferred given her age as recommended by Dr. Kinnie Scales.

## 2012-02-01 ENCOUNTER — Other Ambulatory Visit (INDEPENDENT_AMBULATORY_CARE_PROVIDER_SITE_OTHER): Payer: Medicare Other

## 2012-02-01 DIAGNOSIS — R634 Abnormal weight loss: Secondary | ICD-10-CM

## 2012-02-01 DIAGNOSIS — R1030 Lower abdominal pain, unspecified: Secondary | ICD-10-CM

## 2012-02-01 DIAGNOSIS — R109 Unspecified abdominal pain: Secondary | ICD-10-CM

## 2012-02-01 LAB — COMPREHENSIVE METABOLIC PANEL
Albumin: 3.3 g/dL — ABNORMAL LOW (ref 3.5–5.2)
CO2: 32 mEq/L (ref 19–32)
GFR: 69.54 mL/min (ref >60.00)
Glucose, Bld: 95 mg/dL (ref 70–99)
Sodium: 134 mEq/L — ABNORMAL LOW (ref 135–145)
Total Bilirubin: 0.5 mg/dL (ref 0.3–1.2)
Total Protein: 7 g/dL (ref 6.0–8.3)

## 2012-02-01 LAB — CBC
HCT: 42 % (ref 36.0–46.0)
Platelets: 275 10*3/uL (ref 150.0–400.0)
RBC: 4.4 Mil/uL (ref 3.87–5.11)
WBC: 10.1 10*3/uL (ref 4.5–10.5)

## 2012-02-01 LAB — LIPASE: Lipase: 29 U/L (ref 11.0–59.0)

## 2012-02-04 ENCOUNTER — Ambulatory Visit (INDEPENDENT_AMBULATORY_CARE_PROVIDER_SITE_OTHER)
Admission: RE | Admit: 2012-02-04 | Discharge: 2012-02-04 | Disposition: A | Discharge status: Home / Self Care | Payer: Medicare Other | Source: Ambulatory Visit | Attending: Internal Medicine | Admitting: Internal Medicine

## 2012-02-04 DIAGNOSIS — R109 Unspecified abdominal pain: Secondary | ICD-10-CM

## 2012-02-04 DIAGNOSIS — R1030 Lower abdominal pain, unspecified: Secondary | ICD-10-CM

## 2012-02-04 DIAGNOSIS — R634 Abnormal weight loss: Secondary | ICD-10-CM

## 2012-02-04 MED ORDER — IOHEXOL 300 MG/ML  SOLN
100.0000 mL | Freq: Once | INTRAMUSCULAR | Status: AC | PRN
  Administered 2012-02-04: 100 mL via INTRAVENOUS

## 2012-02-25 ENCOUNTER — Encounter: Payer: Self-pay | Admitting: Internal Medicine

## 2012-02-26 ENCOUNTER — Ambulatory Visit (INDEPENDENT_AMBULATORY_CARE_PROVIDER_SITE_OTHER): Payer: Medicare Other | Admitting: Internal Medicine

## 2012-02-26 ENCOUNTER — Encounter: Payer: Self-pay | Admitting: Internal Medicine

## 2012-02-26 VITALS — BP 98/52 | HR 62 | Ht 60.0 in | Wt 147.0 lb

## 2012-02-26 DIAGNOSIS — K5900 Constipation, unspecified: Secondary | ICD-10-CM

## 2012-02-26 DIAGNOSIS — H6120 Impacted cerumen, unspecified ear: Secondary | ICD-10-CM

## 2012-02-26 DIAGNOSIS — R1032 Left lower quadrant pain: Secondary | ICD-10-CM

## 2012-02-26 DIAGNOSIS — K219 Gastro-esophageal reflux disease without esophagitis: Secondary | ICD-10-CM

## 2012-02-26 MED ORDER — PANTOPRAZOLE SODIUM 40 MG PO TBEC: 40.0000 mg | DELAYED_RELEASE_TABLET | Freq: Every day | ORAL | Status: DC

## 2012-02-26 MED ORDER — POLYETHYLENE GLYCOL 3350 17 GM/SCOOP PO POWD: 17.0000 g | Freq: Every day | ORAL | Status: DC

## 2012-02-26 NOTE — Progress Notes (Signed)
Subjective:    Patient ID: Shirley Wright, female    DOB: [birth year], 78 y.o.   MRN: [id number]  HPI 78 yo female with PMH of hypertension, hyperlipidemia, GERD, osteoarthritis, IBS, diverticulosis and possible diverticulitis, who is seen in followup. She was last seen in early September 2013.  She reports that she has started using MiraLAX on a regular basis in her left lower corner abdominal pain is better.  It is not completely gone, but significantly better and is no longer waking her from sleep. She is found her bowel movements to be "easier" and she denies constipation. She does report one additional episode of epigastric abdominal pain with nausea and the feeling of "hot water" coming up into the back of her throat. She has noted a good appetite, but at times she does not feel she can be off she wants. Occasionally she feels "sick on my stomach".  No fevers or chills. No dysphagia or odynophagia. She does report that her left ear feels stopped up and has decreased hearing. She also feels head congestion and sneezing but no cough or sore throat. She has not lost further weight, weight is still 147 today  Review of Systems As per history of present illness, otherwise negative    Objective:   Physical Exam BP 98/52  Pulse 62  Ht 5' (1.524 m)  Wt 147 lb (66.679 kg)  BMI 28.71 kg/m2 Constitutional: Well-developed and well-nourished. No distress. HEENT: Normocephalic and atraumatic. Oropharynx is clear and moist. No oropharyngeal exudate. Conjunctivae are normal.  No scleral icterus. Left ear cerumen impaction, TM not visible Cardiovascular: Normal rate, regular rhythm and intact distal pulses. No M/R/G Pulmonary/chest: Effort normal and breath sounds normal. No wheezing, rales or rhonchi. Abdominal: Soft, nontender, nondistended. Bowel sounds active throughout. Extremities: no clubbing, cyanosis, trace pretibial edema Neurological: Alert and oriented to person place and time. Psychiatric:  Normal mood and affect. Behavior is normal.  Imaging from 01/30/12 CT ABDOMEN AND PELVIS WITH CONTRAST   Technique:  Multidetector CT imaging of the abdomen and pelvis was performed following the standard protocol during bolus administration of intravenous contrast.   Contrast: OMNIPAQUE IOHEXOL 300 MG/ML  SOLN   Comparison: None.   Findings: Several small cysts are seen in both right and left hepatic lobes, but no liver masses are identified.  The gallbladder is unremarkable, and there is no evidence of biliary ductal dilatation.  The spleen, pancreas, adrenal glands, and kidneys are normal in appearance.  No evidence of hydronephrosis.   Prior hysterectomy noted.  Adnexa are unremarkable in appearance. No soft tissue masses or lymphadenopathy identified within the abdomen or pelvis.   No evidence of inflammatory process or abnormal fluid collections. No evidence of bowel wall thickening or dilatation.  Mild diverticulosis is seen involve the sigmoid colon, however there is no evidence of diverticulitis.  Normal appendix is visualized.  No evidence of hernia.   IMPRESSION:   1.  No acute findings. 2.  Mild sigmoid  diverticulosis.  No radiographic evidence of diverticulitis. .       Assessment & Plan:  78 yo female with PMH of hypertension, hyperlipidemia, GERD, osteoarthritis, IBS, diverticulosis and possible diverticulitis, who is seen in followup  1.  LLQ pain/constipation/hx of diverticulosis -- the patient is up-to-date on her colorectal cancer screening with her last exam being in 2011.  I think that her left lower corner discomfort is more related to constipation, perhaps with some mild narrowing due to diverticulosis. She has improved  with MiraLAX as a laxative and I recommend she continue with this. She found her stools to be too loose with daily use, and she is using this every other day. I recommended that she continue on this interval.  She is asked to call  us if her left-sided pain worsens. She voices understanding. The CT scan performed after her last visit is reviewed and reassuring.  2.  Intermittent epigastric pain/GERD -- her upper symptoms seem acid related and she's not currently on acid suppression therapy. We discussed an upper endoscopy, but she would prefer a medication approach first.  I will start her on pantoprazole 40 mg daily. She is instructed to take this 30 minutes to one hour before her first meal of the day. I will like to see her back in about 6 weeks to be sure that her symptoms have resolved with PPI therapy. If they persist, I will rediscuss endoscopy with her.  3.  Cerumen impaction -- she plans to call her care doctor today for an outpatient appointment.

## 2012-02-26 NOTE — Patient Instructions (Addendum)
Continue taking protonix 40 mg daily. And Miralax 17 g  Everyday to every other day.  Follow up with Dr. Rhea Belton in 6 weeks.

## 2012-03-04 ENCOUNTER — Ambulatory Visit (INDEPENDENT_AMBULATORY_CARE_PROVIDER_SITE_OTHER): Payer: Medicare Other

## 2012-03-04 DIAGNOSIS — Z23 Encounter for immunization: Secondary | ICD-10-CM

## 2012-03-12 ENCOUNTER — Telehealth: Payer: Self-pay | Admitting: Internal Medicine

## 2012-03-12 ENCOUNTER — Other Ambulatory Visit: Payer: Self-pay | Admitting: Gastroenterology

## 2012-04-07 ENCOUNTER — Encounter: Payer: Self-pay | Admitting: Internal Medicine

## 2012-04-08 ENCOUNTER — Encounter: Payer: Self-pay | Admitting: Internal Medicine

## 2012-04-08 ENCOUNTER — Ambulatory Visit (INDEPENDENT_AMBULATORY_CARE_PROVIDER_SITE_OTHER): Payer: Medicare Other | Admitting: Internal Medicine

## 2012-04-08 VITALS — BP 104/62 | HR 60 | Ht 60.0 in | Wt 145.0 lb

## 2012-04-08 DIAGNOSIS — K219 Gastro-esophageal reflux disease without esophagitis: Secondary | ICD-10-CM

## 2012-04-08 DIAGNOSIS — R1032 Left lower quadrant pain: Secondary | ICD-10-CM

## 2012-04-08 DIAGNOSIS — K5900 Constipation, unspecified: Secondary | ICD-10-CM

## 2012-04-08 MED ORDER — PANTOPRAZOLE SODIUM 40 MG PO TBEC: 40.0000 mg | DELAYED_RELEASE_TABLET | Freq: Every day | ORAL | Status: DC

## 2012-04-08 MED ORDER — POLYETHYLENE GLYCOL 3350 17 GM/SCOOP PO POWD: 17.0000 g | Freq: Every day | ORAL | Status: DC

## 2012-04-08 NOTE — Patient Instructions (Addendum)
Take Miralax 17g daily, and continue taking Protonix daily   You have a follow up appointment with Dr. Rhea Belton on 05/27/2012 @ 9am

## 2012-04-08 NOTE — Progress Notes (Signed)
Subjective:    Patient ID: Shirley Wright, female    DOB: 1925-07-04, 78 y.o.   MRN: [id number]  HPI Shirley Wright is an 78 yo female with PMH of hypertension, hyperlipidemia, GERD, osteoarthritis, IBS, diverticulosis and possible diverticulitis, who is seen in followup. She is accompanied today by her daughter in last seen about 6 weeks ago. Today she reports she feels very well without specific complaint. Her epigastric abdominal pain has resolved completely with the addition of pantoprazole therapy daily. She is also not having the water brash symptoms that she described before. It sounds that she is still having issues with intermittent left lower quadrant pain and constipation.  This tends to happen about once per week. This last happened 5 days ago. During this episode she reports hard stool followed by loose stool. She reports the pain is in her left lower quadrant and feels cramping in nature and lasts approximately 45 minutes. It is relieved when she has been able to have a bowel movement. Occasionally she has nausea associated with this left lower quadrant pain. She has not had a bowel movement since this episode, nor has she had any further left lower corner pain. She denies fever and chills. Her weight has been stable. In between these episodes she reports a good appetite. Occasionally she is using Vicodin for lower back pain.  Review of Systems As per history of present illness, otherwise negative  Current Medications, Allergies, Past Medical History, Past Surgical History, Family History and Social History were reviewed in [employer] record.     Objective:   Physical Exam BP 104/62  Pulse 60  Ht 5' (1.524 m)  Wt 145 lb (65.772 kg)  BMI 28.32 kg/m2 Constitutional: Pleasant elderly female in no acute distress HEENT: Normocephalic and atraumatic. Oropharynx is clear and moist. No oropharyngeal exudate. Conjunctivae are normal.  No scleral icterus. Neck:  Neck supple. Trachea midline. Cardiovascular: Normal rate, regular rhythm and intact distal pulses.  Pulmonary/chest: Effort normal and breath sounds normal. No wheezing, rales or rhonchi. Abdominal: Soft, very mild left lower tenderness without rebound or guarding, nondistended. Bowel sounds active throughout. There are no masses palpable.  Extremities: no clubbing, cyanosis, or edema Neurological: Alert and oriented to person place and time. Skin: Skin is warm and dry. No rashes noted. Psychiatric: Normal mood and affect. Behavior is normal.  Colonoscopy -- performed by Dr. Kinnie Scales, 10/31/2009 -- diminutive right colon polyp excised by biopsy, extensive rectosigmoid diverticulosis, internal hemorrhoids. Visualization of the cecum with good prep and "excellent visualization"throughout.   CT ABDOMEN AND PELVIS WITH CONTRAST -- 02/05/2012   Technique:  Multidetector CT imaging of the abdomen and pelvis was performed following the standard protocol during bolus administration of intravenous contrast.   Contrast: OMNIPAQUE IOHEXOL 300 MG/ML  SOLN   Comparison: None.   Findings: Several small cysts are seen in both right and left hepatic lobes, but no liver masses are identified.  The gallbladder is unremarkable, and there is no evidence of biliary ductal dilatation.  The spleen, pancreas, adrenal glands, and kidneys are normal in appearance.  No evidence of hydronephrosis.   Prior hysterectomy noted.  Adnexa are unremarkable in appearance. No soft tissue masses or lymphadenopathy identified within the abdomen or pelvis.   No evidence of inflammatory process or abnormal fluid collections. No evidence of bowel wall thickening or dilatation.  Mild diverticulosis is seen involve the sigmoid colon, however there is no evidence of diverticulitis.  Normal appendix is visualized.  No evidence of hernia.   IMPRESSION:   1.  No acute findings. 2.  Mild sigmoid  diverticulosis.  No  radiographic evidence of diverticulitis. .      Assessment & Plan:   78 yo female with PMH of hypertension, hyperlipidemia, GERD, osteoarthritis, IBS, diverticulosis and possible diverticulitis, who is seen in followup.  1.  Intermittent LLQ pain/constipation -- the patient's painful episodes seem to be related to constipation, and occur after she has gone several days without bowel movement. Initially her symptoms seem to improve with MiraLAX, but after questioning she is using this sporadically and on no certain schedule. She's had several good days of late, but no bowel movement. My suspicion for diverticulitis is very low given her recent imaging and the fact that her symptoms are episodic and resolve in less than 1 day. She's also had no associated fever or chills.  She had a colonoscopy in June of 2011 which revealed extensive rectosigmoid diverticulosis.  I think that most of her issues related to constipation and possibly some rectosigmoid narrowing associated with her known diverticulosis. I would like for her to schedule MiraLAX 17 g every day to every other day to try to keep her bowels moving. I feel that her pain is coming from constipation and colonic spasm associated with this. We have also discussed how this may be exacerbated by her use of hydrocodone. I expect if she is able to have daily or every other day stools (soft but formed) her episodic pain may resolve. I will see her back in 6 weeks and ensure that her pain is better with more frequent bowel movements. She and her daughter understand and are in agreement with this plan.  2.  Dyspepsia/GERD -- improved and resolved on pantoprazole. She will continue this medication at 40 mg daily for now.

## 2012-04-14 ENCOUNTER — Ambulatory Visit (INDEPENDENT_AMBULATORY_CARE_PROVIDER_SITE_OTHER): Payer: Medicare Other | Admitting: Family Medicine

## 2012-04-14 ENCOUNTER — Encounter: Payer: Self-pay | Admitting: Family Medicine

## 2012-04-14 VITALS — BP 106/80 | HR 60 | Temp 97.9°F | Ht 61.5 in | Wt 144.0 lb

## 2012-04-14 DIAGNOSIS — K589 Irritable bowel syndrome without diarrhea: Secondary | ICD-10-CM

## 2012-04-14 DIAGNOSIS — Z0000 Encounter for general adult medical examination without abnormal findings: Secondary | ICD-10-CM

## 2012-04-14 DIAGNOSIS — M1990 Unspecified osteoarthritis, unspecified site: Secondary | ICD-10-CM

## 2012-04-14 DIAGNOSIS — M545 Low back pain, unspecified: Secondary | ICD-10-CM

## 2012-04-14 DIAGNOSIS — I10 Essential (primary) hypertension: Secondary | ICD-10-CM

## 2012-04-14 DIAGNOSIS — E785 Hyperlipidemia, unspecified: Secondary | ICD-10-CM

## 2012-04-14 LAB — CBC WITH DIFFERENTIAL/PLATELET
Basophils Relative: 0.4 % (ref 0.0–3.0)
Eosinophils Absolute: 0.3 10*3/uL (ref 0.0–0.7)
Lymphs Abs: 2.2 10*3/uL (ref 0.7–4.0)
MCHC: 31.9 g/dL (ref 30.0–36.0)
MCV: 95.3 fl (ref 78.0–100.0)
Monocytes Absolute: 0.5 10*3/uL (ref 0.1–1.0)
Neutrophils Relative %: 52.7 % (ref 43.0–77.0)
Platelets: 271 10*3/uL (ref 150.0–400.0)
RBC: 4.57 Mil/uL (ref 3.87–5.11)

## 2012-04-14 LAB — HEPATIC FUNCTION PANEL
AST: 21 U/L (ref 0–37)
Albumin: 3.5 g/dL (ref 3.5–5.2)
Alkaline Phosphatase: 88 U/L (ref 39–117)
Total Protein: 7.4 g/dL (ref 6.0–8.3)

## 2012-04-14 LAB — BASIC METABOLIC PANEL
CO2: 30 mEq/L (ref 19–32)
Glucose, Bld: 96 mg/dL (ref 70–99)
Potassium: 5 mEq/L (ref 3.5–5.1)
Sodium: 135 mEq/L (ref 135–145)

## 2012-04-14 LAB — TSH: TSH: 1.82 u[IU]/mL (ref 0.35–5.50)

## 2012-04-14 LAB — POCT URINALYSIS DIPSTICK
Bilirubin, UA: NEGATIVE
Blood, UA: NEGATIVE
Glucose, UA: NEGATIVE
Ketones, UA: NEGATIVE
Leukocytes, UA: NEGATIVE
Nitrite, UA: NEGATIVE
Protein, UA: NEGATIVE
Spec Grav, UA: 1.015
Urobilinogen, UA: 0.2
pH, UA: 7

## 2012-04-14 MED ORDER — OXYBUTYNIN CHLORIDE ER 5 MG PO TB24: 5.0000 mg | ORAL_TABLET | Freq: Every day | ORAL | Status: DC

## 2012-04-14 NOTE — Progress Notes (Signed)
  Subjective:    Patient ID: Shirley Wright, female    DOB: 1925-10-12, 78 y.o.   MRN: [id number]  HPI 78 yr old female for a cpx. She feels well except for urinary urgency and occasional incontinence. No discomfort.    Review of Systems  Constitutional: Negative.   HENT: Negative.   Eyes: Negative.   Respiratory: Negative.   Cardiovascular: Negative.   Gastrointestinal: Negative.   Genitourinary: Positive for urgency. Negative for dysuria, frequency, hematuria, flank pain, decreased urine volume, enuresis, difficulty urinating, pelvic pain and dyspareunia.  Musculoskeletal: Negative.   Skin: Negative.   Neurological: Negative.   Hematological: Negative.   Psychiatric/Behavioral: Negative.        Objective:   Physical Exam  Constitutional: She is oriented to person, place, and time. She appears well-developed and well-nourished. No distress.  HENT:  Head: Normocephalic and atraumatic.  Right Ear: External ear normal.  Left Ear: External ear normal.  Nose: Nose normal.  Mouth/Throat: Oropharynx is clear and moist. No oropharyngeal exudate.  Eyes: Conjunctivae normal and EOM are normal. Pupils are equal, round, and reactive to light. No scleral icterus.  Neck: Normal range of motion. Neck supple. No JVD present. No thyromegaly present.  Cardiovascular: Normal rate, regular rhythm, normal heart sounds and intact distal pulses.  Exam reveals no gallop and no friction rub.   No murmur heard.      EKG normal   Pulmonary/Chest: Effort normal and breath sounds normal. No respiratory distress. She has no wheezes. She has no rales. She exhibits no tenderness.  Abdominal: Soft. Bowel sounds are normal. She exhibits no distension and no mass. There is no tenderness. There is no rebound and no guarding.  Musculoskeletal: Normal range of motion. She exhibits no edema and no tenderness.  Lymphadenopathy:    She has no cervical adenopathy.  Neurological: She is alert and oriented to  person, place, and time. She has normal reflexes. No cranial nerve deficit. She exhibits normal muscle tone. Coordination normal.  Skin: Skin is warm and dry. No rash noted. No erythema.  Psychiatric: She has a normal mood and affect. Her behavior is normal. Judgment and thought content normal.          Assessment & Plan:  Well exam. Get fasting labs. Try Oxybutynin ER.

## 2012-04-15 NOTE — Progress Notes (Signed)
Quick Note:  I spoke with pt ______ 

## 2012-04-21 NOTE — Telephone Encounter (Signed)
Per stacy completed

## 2012-04-30 ENCOUNTER — Telehealth: Payer: Self-pay | Admitting: Family Medicine

## 2012-04-30 MED ORDER — ISRADIPINE 2.5 MG PO CAPS: 2.5000 mg | ORAL_CAPSULE | Freq: Every day | ORAL | Status: DC

## 2012-04-30 NOTE — Telephone Encounter (Signed)
Refill request for Isradipine 2.5 mg and I did send to [facility] ( 90 day supply )

## 2012-05-22 ENCOUNTER — Encounter: Payer: Self-pay | Admitting: Internal Medicine

## 2012-05-27 ENCOUNTER — Encounter: Payer: Self-pay | Admitting: Internal Medicine

## 2012-05-27 ENCOUNTER — Ambulatory Visit: Payer: Medicare Other | Admitting: Internal Medicine

## 2012-05-27 ENCOUNTER — Ambulatory Visit (INDEPENDENT_AMBULATORY_CARE_PROVIDER_SITE_OTHER): Payer: Medicare Other | Admitting: Internal Medicine

## 2012-05-27 VITALS — BP 124/70 | HR 60 | Ht 61.5 in | Wt 147.8 lb

## 2012-05-27 DIAGNOSIS — R55 Syncope and collapse: Secondary | ICD-10-CM

## 2012-05-27 DIAGNOSIS — R1032 Left lower quadrant pain: Secondary | ICD-10-CM

## 2012-05-27 DIAGNOSIS — K3189 Other diseases of stomach and duodenum: Secondary | ICD-10-CM

## 2012-05-27 DIAGNOSIS — R1013 Epigastric pain: Secondary | ICD-10-CM

## 2012-05-27 DIAGNOSIS — K5900 Constipation, unspecified: Secondary | ICD-10-CM

## 2012-05-27 NOTE — Progress Notes (Signed)
Subjective:    Patient ID: Shirley Wright, female    DOB: 1926-05-03, 79 y.o.   MRN: [id number]  HPI Mrs. Telfair is an 79 yo female with PMH of hypertension, hyperlipidemia, GERD, osteoarthritis, IBS, diverticulosis and possible diverticulitis, who is seen in followup.  She was last seen in late November, at which time she still having issues with intermittent left lower corner abdominal pain. The decision was made to initiate MiraLAX therapy on a more scheduled basis. Returns today stating that she is much improved from a left lower quadrant pain standpoint and is having regular bowel movements. She is taking MiraLAX 17 g every other day. When she uses it daily her stools are too loose. She is very happy with the result and has not had any other concerning symptoms such as blood in her stool or melena. No fever or chills. No upper abdominal pain or heartburn remains well controlled on her current PPI.  She does report approximately 2 weeks ago having a syncopal episode that occurred after standing from her bed. She reports she was getting up to use the bathroom.  The last she remembers is standing up sliding on her bedroom slippers, and then waking up to her husband standing over her. She denies any presyncopal symptoms including no palpitations, chest pain or dyspnea. She did later find a bruise on her right upper extremity. She has not had headaches or changes in vision.  She does not recall having previous episodes of syncope and has not had another since.  Review of Systems As per history of present illness, otherwise negative  Current Medications, Allergies, Past Medical History, Past Surgical History, Family History and Social History were reviewed in [employer] record.     Objective:   Physical Exam BP 124/70  Pulse 60  Ht 5' 1.5" (1.562 m)  Wt 147 lb 12.8 oz (67.042 kg)  BMI 27.47 kg/m2 Constitutional: Well-developed and well-nourished. No distress. HEENT:  Normocephalic and atraumatic.  No scleral icterus. Cardiovascular: Normal rate, regular rhythm and intact distal pulses. No M/R/G Pulmonary/chest: Effort normal and breath sounds normal. No wheezing, rales or rhonchi. Abdominal: Soft, nontender, nondistended. Bowel sounds active throughout.  Extremities: no clubbing, cyanosis, or edema Neurological: Alert and oriented to person place and time. Moving all 4 extremities  Skin: Skin is warm and dry. No rashes noted. Healing ecchymosis right upper extremity 3-4 cm above the elbow lateral side Psychiatric: Normal mood and affect. Behavior is normal.     Assessment & Plan:  79 yo female with PMH of hypertension, hyperlipidemia, GERD, osteoarthritis, IBS, diverticulosis and possible diverticulitis, who is seen in followup.   1. Intermittent LLQ pain/constipation -- symptoms have greatly improved since she began schedule MiraLAX therapy. This is very reassuring, and the plan for now will be to continue MiraLAX 17 g every other day. I've asked that she notify us should she develop return of her left lower corner pain or any other concerning symptoms such as fever, nausea vomiting, or any evidence of bleeding. She voices understanding    2. Dyspepsia/GERD -- improved and resolved on pantoprazole. She will continue this medication at 40 mg daily for now.  3.  Syncope -- the patient had an episode of syncope without any presyncopal or prodrome symptoms. I feel that she should have this evaluated promptly with her primary care provider or be seen by cardiology. She would like to start Dr. Clent Ridges, and I will message him to arrange followup for her.  She understands the plan, and I've asked her to notify me or Dr. Clent Ridges should she develop presyncopal symptoms, chest pain, dyspnea, palpitations, or any further episodes of syncope.  She voices understanding

## 2012-05-27 NOTE — Patient Instructions (Addendum)
Continue taking miralax every other day.  You will hear from Dr. Claris Che office regarding your passing out.  Follow up with Dr. Rhea Belton as needed

## 2012-08-06 ENCOUNTER — Telehealth: Payer: Self-pay | Admitting: Family Medicine

## 2012-08-06 MED ORDER — HYDROCODONE-ACETAMINOPHEN 5-325 MG PO TABS: 1.0000 | ORAL_TABLET | Freq: Four times a day (QID) | ORAL | Status: DC | PRN

## 2012-08-06 NOTE — Telephone Encounter (Signed)
done

## 2012-08-06 NOTE — Telephone Encounter (Signed)
Refill request for Vicodin and change dose ( 90 day supply ) to [facility].

## 2012-08-07 NOTE — Telephone Encounter (Signed)
Script was faxed.

## 2012-09-22 ENCOUNTER — Other Ambulatory Visit: Payer: Self-pay | Admitting: Internal Medicine

## 2012-10-03 ENCOUNTER — Other Ambulatory Visit: Payer: Self-pay | Admitting: Internal Medicine

## 2012-10-03 ENCOUNTER — Telehealth: Payer: Self-pay | Admitting: Family Medicine

## 2012-10-03 NOTE — Telephone Encounter (Signed)
Refill request for Klor-Con 20 meq and a 90 day supply.

## 2012-10-06 MED ORDER — POTASSIUM CHLORIDE CRYS ER 20 MEQ PO TBCR: 20.0000 meq | EXTENDED_RELEASE_TABLET | Freq: Every day | ORAL | Status: DC

## 2012-10-06 NOTE — Telephone Encounter (Signed)
Okay for one year  

## 2012-10-06 NOTE — Telephone Encounter (Signed)
I sent script e-scribe. 

## 2012-11-03 ENCOUNTER — Telehealth: Payer: Self-pay | Admitting: Family Medicine

## 2012-11-03 MED ORDER — SIMVASTATIN 40 MG PO TABS: 40.0000 mg | ORAL_TABLET | Freq: Every day | ORAL | Status: DC

## 2012-11-03 NOTE — Telephone Encounter (Signed)
Refill request for Zocor 40 mg and send to [facility], which I did send e-scribe.

## 2012-11-04 ENCOUNTER — Other Ambulatory Visit: Payer: Self-pay | Admitting: Gastroenterology

## 2012-11-04 ENCOUNTER — Telehealth: Payer: Self-pay | Admitting: Family Medicine

## 2012-11-04 MED ORDER — ESOMEPRAZOLE MAGNESIUM 40 MG PO PACK: 40.0000 mg | PACK | Freq: Every day | ORAL | Status: DC

## 2012-11-04 MED ORDER — METOPROLOL TARTRATE 50 MG PO TABS: 50.0000 mg | ORAL_TABLET | Freq: Every day | ORAL | Status: DC

## 2012-11-04 MED ORDER — VALSARTAN-HYDROCHLOROTHIAZIDE 320-25 MG PO TABS: 1.0000 | ORAL_TABLET | Freq: Every day | ORAL | Status: DC

## 2012-11-04 NOTE — Telephone Encounter (Signed)
Refill request for Diovan 320/25 mg & Metoprolol 50 mg and send to [facility]. I did send scripts e-scribe.

## 2012-11-14 ENCOUNTER — Other Ambulatory Visit: Payer: Self-pay | Admitting: Gastroenterology

## 2012-12-26 ENCOUNTER — Encounter: Payer: Self-pay | Admitting: Family Medicine

## 2013-03-13 ENCOUNTER — Ambulatory Visit (INDEPENDENT_AMBULATORY_CARE_PROVIDER_SITE_OTHER): Payer: Medicare Other

## 2013-03-13 DIAGNOSIS — Z23 Encounter for immunization: Secondary | ICD-10-CM

## 2013-03-16 ENCOUNTER — Telehealth: Payer: Self-pay | Admitting: Family Medicine

## 2013-03-16 MED ORDER — METOPROLOL TARTRATE 50 MG PO TABS: 50.0000 mg | ORAL_TABLET | Freq: Every day | ORAL | Status: DC

## 2013-03-16 MED ORDER — VALSARTAN-HYDROCHLOROTHIAZIDE 320-25 MG PO TABS: 1.0000 | ORAL_TABLET | Freq: Every day | ORAL | Status: DC

## 2013-03-16 MED ORDER — ISRADIPINE 2.5 MG PO CAPS: 2.5000 mg | ORAL_CAPSULE | Freq: Every day | ORAL | Status: DC

## 2013-03-16 MED ORDER — SIMVASTATIN 40 MG PO TABS: 40.0000 mg | ORAL_TABLET | Freq: Every day | ORAL | Status: DC

## 2013-03-16 NOTE — Telephone Encounter (Signed)
Pt has appointment in November 2014 here with Dr. Clent Ridges. I send in all 4 scripts e-scribe for a 90 day supply only.

## 2013-03-16 NOTE — Telephone Encounter (Signed)
Refill request for Valsartan/HCTZ 320-25 mg qd, Metoprolol 50 mg qd, Isradipine 2.5 mg qd, Simvastatin 40 mg qd and send a 90 day supply to [facility].

## 2013-04-07 ENCOUNTER — Encounter: Payer: Self-pay | Admitting: Family Medicine

## 2013-04-07 ENCOUNTER — Ambulatory Visit (INDEPENDENT_AMBULATORY_CARE_PROVIDER_SITE_OTHER): Payer: Medicare Other | Admitting: Family Medicine

## 2013-04-07 VITALS — BP 140/70 | HR 72 | Temp 98.1°F | Ht 60.5 in | Wt 141.0 lb

## 2013-04-07 DIAGNOSIS — I10 Essential (primary) hypertension: Secondary | ICD-10-CM

## 2013-04-07 DIAGNOSIS — Z0000 Encounter for general adult medical examination without abnormal findings: Secondary | ICD-10-CM

## 2013-04-07 LAB — POCT URINALYSIS DIPSTICK
Bilirubin, UA: NEGATIVE
Glucose, UA: NEGATIVE
Ketones, UA: NEGATIVE
Leukocytes, UA: NEGATIVE
Spec Grav, UA: 1.015

## 2013-04-07 LAB — LIPID PANEL
Total CHOL/HDL Ratio: 3
Triglycerides: 120 mg/dL (ref 0.0–149.0)

## 2013-04-07 LAB — HEPATIC FUNCTION PANEL
AST: 22 U/L (ref 0–37)
Alkaline Phosphatase: 84 U/L (ref 39–117)
Bilirubin, Direct: 0.1 mg/dL (ref 0.0–0.3)
Total Bilirubin: 0.8 mg/dL (ref 0.3–1.2)

## 2013-04-07 LAB — CBC WITH DIFFERENTIAL/PLATELET
Basophils Absolute: 0 10*3/uL (ref 0.0–0.1)
HCT: 42.9 % (ref 36.0–46.0)
Hemoglobin: 14.2 g/dL (ref 12.0–15.0)
Lymphs Abs: 2.5 10*3/uL (ref 0.7–4.0)
MCV: 93.6 fl (ref 78.0–100.0)
Monocytes Absolute: 0.7 10*3/uL (ref 0.1–1.0)
Neutro Abs: 5.6 10*3/uL (ref 1.4–7.7)
Platelets: 298 10*3/uL (ref 150.0–400.0)
RDW: 13.3 % (ref 11.5–14.6)

## 2013-04-07 LAB — BASIC METABOLIC PANEL
CO2: 31 mEq/L (ref 19–32)
Chloride: 94 mEq/L — ABNORMAL LOW (ref 96–112)
Creatinine, Ser: 0.9 mg/dL (ref 0.4–1.2)
Potassium: 4.1 mEq/L (ref 3.5–5.1)

## 2013-04-07 LAB — TSH: TSH: 2.16 u[IU]/mL (ref 0.35–5.50)

## 2013-04-07 MED ORDER — FUROSEMIDE 20 MG PO TABS: 20.0000 mg | ORAL_TABLET | Freq: Every day | ORAL | Status: DC

## 2013-04-07 NOTE — Progress Notes (Signed)
  Subjective:    Patient ID: Shirley Wright, female    DOB: [birth year], 79 y.o.   MRN: [id number]  HPI 79 yr old female for a cpx. She is doing well in general, although she does mention some swelling around the ankles for a few weeks. No hand swelling. No chest pain or SOB.    Review of Systems  Constitutional: Negative.   HENT: Negative.   Eyes: Negative.   Respiratory: Negative.   Cardiovascular: Negative.   Gastrointestinal: Negative.   Genitourinary: Negative for dysuria, urgency, frequency, hematuria, flank pain, decreased urine volume, enuresis, difficulty urinating, pelvic pain and dyspareunia.  Musculoskeletal: Negative.   Skin: Negative.   Neurological: Negative.   Psychiatric/Behavioral: Negative.        Objective:   Physical Exam  Constitutional: She is oriented to person, place, and time. She appears well-developed and well-nourished. No distress.  HENT:  Head: Normocephalic and atraumatic.  Right Ear: External ear normal.  Left Ear: External ear normal.  Nose: Nose normal.  Mouth/Throat: Oropharynx is clear and moist. No oropharyngeal exudate.  Eyes: Conjunctivae and EOM are normal. Pupils are equal, round, and reactive to light. No scleral icterus.  Neck: Normal range of motion. Neck supple. No JVD present. No thyromegaly present.  Cardiovascular: Normal rate, regular rhythm, normal heart sounds and intact distal pulses.  Exam reveals no gallop and no friction rub.   No murmur heard. EKG normal   Pulmonary/Chest: Effort normal and breath sounds normal. No respiratory distress. She has no wheezes. She has no rales. She exhibits no tenderness.  Abdominal: Soft. Bowel sounds are normal. She exhibits no distension and no mass. There is no tenderness. There is no rebound and no guarding.  Musculoskeletal: Normal range of motion. She exhibits no edema and no tenderness.  Lymphadenopathy:    She has no cervical adenopathy.  Neurological: She is alert and oriented to  person, place, and time. She has normal reflexes. No cranial nerve deficit. She exhibits normal muscle tone. Coordination normal.  Skin: Skin is warm and dry. No rash noted. No erythema.  Psychiatric: She has a normal mood and affect. Her behavior is normal. Judgment and thought content normal.          Assessment & Plan:  Well exam. Try Lasix daily. Get fasting labs

## 2013-04-07 NOTE — Progress Notes (Signed)
Pre visit review using our clinic review tool, if applicable. No additional management support is needed unless otherwise documented below in the visit note. 

## 2013-07-04 ENCOUNTER — Other Ambulatory Visit: Payer: Self-pay | Admitting: Family Medicine

## 2013-08-10 ENCOUNTER — Telehealth: Payer: Self-pay

## 2013-08-10 NOTE — Telephone Encounter (Signed)
Drew from  [facility] req rx refill on  HYDROcodone-acetaminophen (NORCO/VICODIN) 5-325 MG per tablet

## 2013-08-10 NOTE — Telephone Encounter (Signed)
Please let her know her doctor returns on Thursday and we will give him this refill request. It appears it has been over 1 year since he last prescribed this and he may wish to see her prior to refills. Can give her # 20 with no refills. 1 tab q 8 hours prn.

## 2013-08-10 NOTE — Telephone Encounter (Signed)
Rx last filled 3.19.14 #360 with 1 rf.  Pt last seen 11.18.14. Pls advise.

## 2013-08-11 ENCOUNTER — Other Ambulatory Visit: Payer: Self-pay | Admitting: Family Medicine

## 2013-08-11 ENCOUNTER — Telehealth: Payer: Self-pay | Admitting: Family Medicine

## 2013-08-11 MED ORDER — METOPROLOL TARTRATE 50 MG PO TABS: 50.0000 mg | ORAL_TABLET | Freq: Every day | ORAL | Status: DC

## 2013-08-11 MED ORDER — ISRADIPINE 2.5 MG PO CAPS: 2.5000 mg | ORAL_CAPSULE | Freq: Every day | ORAL | Status: DC

## 2013-08-11 MED ORDER — SIMVASTATIN 40 MG PO TABS: 40.0000 mg | ORAL_TABLET | Freq: Every day | ORAL | Status: DC

## 2013-08-11 NOTE — Telephone Encounter (Signed)
Called and spoke with pt and she has enough to cover her until Dr. Sarajane Jews returns Thursday.  Pt would rather wait.

## 2013-08-11 NOTE — Telephone Encounter (Signed)
Rx sent to pharmacy   

## 2013-08-11 NOTE — Addendum Note (Signed)
Addended by: Colleen Can on: 08/11/2013 04:48 PM   Modules accepted: Orders

## 2013-08-11 NOTE — Telephone Encounter (Signed)
[facility] [place], [place] is requesting re-fills on the following:   simvastatin (ZOCOR) 40 MG tablet metoprolol (LOPRESSOR) 50 MG tablet isradipine (DYNACIRC) 2.5 MG capsule

## 2013-08-11 NOTE — Addendum Note (Signed)
Addended by: Colleen Can on: 08/11/2013 03:45 PM   Modules accepted: Orders

## 2013-08-12 ENCOUNTER — Telehealth: Payer: Self-pay | Admitting: Family Medicine

## 2013-08-12 MED ORDER — VALSARTAN-HYDROCHLOROTHIAZIDE 320-25 MG PO TABS: 1.0000 | ORAL_TABLET | Freq: Every day | ORAL | Status: DC

## 2013-08-12 NOTE — Telephone Encounter (Signed)
I sent script e-scribe. 

## 2013-08-12 NOTE — Telephone Encounter (Signed)
[facility] [place], [place] is requesting a re-fill valsartan-hydrochlorothiazide (DIOVAN HCT) 320-25 MG per tablet

## 2013-08-14 MED ORDER — HYDROCODONE-ACETAMINOPHEN 5-325 MG PO TABS: 1.0000 | ORAL_TABLET | Freq: Four times a day (QID) | ORAL | Status: DC | PRN

## 2013-08-14 NOTE — Telephone Encounter (Signed)
Done for one month, she needs testing and a contract

## 2013-08-14 NOTE — Telephone Encounter (Signed)
Pt aware Rx ready for pick and that she will need to see Chanel in the lab for a UDS. Pt states will come in on Monday

## 2013-08-17 ENCOUNTER — Encounter: Payer: Self-pay | Admitting: *Deleted

## 2013-08-18 ENCOUNTER — Other Ambulatory Visit: Payer: Self-pay | Admitting: Family Medicine

## 2013-09-22 ENCOUNTER — Encounter: Payer: Self-pay | Admitting: Family Medicine

## 2013-11-10 ENCOUNTER — Telehealth: Payer: Self-pay | Admitting: Family Medicine

## 2013-11-10 NOTE — Telephone Encounter (Signed)
Can  Disp 40 # until dr fry gets back in office. Further refills via dr Sarajane Jews.

## 2013-11-10 NOTE — Telephone Encounter (Signed)
Pt needs new rx hydrocodone °

## 2013-11-11 MED ORDER — HYDROCODONE-ACETAMINOPHEN 5-325 MG PO TABS: 1.0000 | ORAL_TABLET | Freq: Four times a day (QID) | ORAL | Status: DC | PRN

## 2013-11-11 NOTE — Telephone Encounter (Signed)
Script is ready for pick up and I spoke with pt.  

## 2013-11-29 ENCOUNTER — Other Ambulatory Visit: Payer: Self-pay | Admitting: Family Medicine

## 2013-12-01 ENCOUNTER — Telehealth: Payer: Self-pay | Admitting: Family Medicine

## 2013-12-01 ENCOUNTER — Other Ambulatory Visit: Payer: Self-pay | Admitting: Family Medicine

## 2013-12-01 NOTE — Telephone Encounter (Signed)
Pt req rx on HYDROcodone-acetaminophen (NORCO/VICODIN) 5-325 MG per tablet

## 2013-12-02 MED ORDER — HYDROCODONE-ACETAMINOPHEN 5-325 MG PO TABS: 1.0000 | ORAL_TABLET | Freq: Four times a day (QID) | ORAL | Status: DC | PRN

## 2013-12-02 NOTE — Telephone Encounter (Signed)
done

## 2013-12-02 NOTE — Telephone Encounter (Signed)
Script is ready for pick up and I spoke with pt.  

## 2013-12-28 ENCOUNTER — Telehealth: Payer: Self-pay | Admitting: Family Medicine

## 2013-12-28 NOTE — Telephone Encounter (Signed)
[facility] [place], [place] is requesting re-fills on the following:  simvastatin (ZOCOR) 40 MG tablet valsartan-hydrochlorothiazide (DIOVAN HCT) 320-25 MG per tablet isradipine (DYNACIRC) 2.5 MG capsule metoprolol (LOPRESSOR) 50 MG tablet

## 2013-12-29 ENCOUNTER — Encounter: Payer: Self-pay | Admitting: Family Medicine

## 2013-12-30 MED ORDER — ISRADIPINE 2.5 MG PO CAPS: 2.5000 mg | ORAL_CAPSULE | Freq: Every day | ORAL | Status: DC

## 2013-12-30 MED ORDER — VALSARTAN-HYDROCHLOROTHIAZIDE 320-25 MG PO TABS: 1.0000 | ORAL_TABLET | Freq: Every day | ORAL | Status: DC

## 2013-12-30 MED ORDER — SIMVASTATIN 40 MG PO TABS: 40.0000 mg | ORAL_TABLET | Freq: Every day | ORAL | Status: DC

## 2013-12-30 MED ORDER — METOPROLOL TARTRATE 50 MG PO TABS: 50.0000 mg | ORAL_TABLET | Freq: Every day | ORAL | Status: DC

## 2013-12-30 NOTE — Telephone Encounter (Signed)
Rxs done. 

## 2014-01-01 MED ORDER — ISRADIPINE 2.5 MG PO CAPS: 2.5000 mg | ORAL_CAPSULE | Freq: Every day | ORAL | Status: DC

## 2014-01-01 MED ORDER — SIMVASTATIN 40 MG PO TABS
40.0000 mg | ORAL_TABLET | Freq: Every day | ORAL | Status: DC
Start: 2014-01-01 — End: 2014-04-12

## 2014-01-01 MED ORDER — VALSARTAN-HYDROCHLOROTHIAZIDE 320-25 MG PO TABS: 1.0000 | ORAL_TABLET | Freq: Every day | ORAL | Status: DC

## 2014-01-01 MED ORDER — METOPROLOL TARTRATE 50 MG PO TABS: 50.0000 mg | ORAL_TABLET | Freq: Every day | ORAL | Status: DC

## 2014-01-01 NOTE — Telephone Encounter (Signed)
The below medications were sent to [facility]. Please re-send to [facility].

## 2014-01-01 NOTE — Telephone Encounter (Signed)
I sent all 4 scripts to [facility] and spoke with pt.

## 2014-01-01 NOTE — Addendum Note (Signed)
Addended by: Aggie Hacker A on: 01/01/2014 09:44 AM   Modules accepted: Orders

## 2014-02-24 ENCOUNTER — Ambulatory Visit (INDEPENDENT_AMBULATORY_CARE_PROVIDER_SITE_OTHER): Payer: Medicare Other

## 2014-02-24 DIAGNOSIS — Z23 Encounter for immunization: Secondary | ICD-10-CM

## 2014-04-05 ENCOUNTER — Other Ambulatory Visit (INDEPENDENT_AMBULATORY_CARE_PROVIDER_SITE_OTHER): Payer: Medicare Other

## 2014-04-05 DIAGNOSIS — Z0000 Encounter for general adult medical examination without abnormal findings: Secondary | ICD-10-CM

## 2014-04-05 DIAGNOSIS — I10 Essential (primary) hypertension: Secondary | ICD-10-CM

## 2014-04-05 LAB — CBC WITH DIFFERENTIAL/PLATELET
BASOS ABS: 0 10*3/uL (ref 0.0–0.1)
Basophils Relative: 0.3 % (ref 0.0–3.0)
EOS ABS: 0.4 10*3/uL (ref 0.0–0.7)
Eosinophils Relative: 5.8 % — ABNORMAL HIGH (ref 0.0–5.0)
HEMATOCRIT: 42.3 % (ref 36.0–46.0)
Hemoglobin: 13.9 g/dL (ref 12.0–15.0)
LYMPHS ABS: 2 10*3/uL (ref 0.7–4.0)
Lymphocytes Relative: 28.8 % (ref 12.0–46.0)
MCHC: 32.9 g/dL (ref 30.0–36.0)
MCV: 92.8 fl (ref 78.0–100.0)
MONO ABS: 0.5 10*3/uL (ref 0.1–1.0)
Monocytes Relative: 7.9 % (ref 3.0–12.0)
NEUTROS PCT: 57.2 % (ref 43.0–77.0)
Neutro Abs: 4 10*3/uL (ref 1.4–7.7)
PLATELETS: 289 10*3/uL (ref 150.0–400.0)
RBC: 4.56 Mil/uL (ref 3.87–5.11)
RDW: 13.9 % (ref 11.5–15.5)
WBC: 6.9 10*3/uL (ref 4.0–10.5)

## 2014-04-05 LAB — LIPID PANEL
Cholesterol: 130 mg/dL (ref 0–200)
HDL: 37.6 mg/dL — ABNORMAL LOW (ref >39.00)
LDL Cholesterol: 68 mg/dL (ref 0–99)
NONHDL: 92.4
Total CHOL/HDL Ratio: 3
Triglycerides: 123 mg/dL (ref 0.0–149.0)
VLDL: 24.6 mg/dL (ref 0.0–40.0)

## 2014-04-05 LAB — BASIC METABOLIC PANEL
BUN: 17 mg/dL (ref 6–23)
CALCIUM: 10 mg/dL (ref 8.4–10.5)
CO2: 30 mEq/L (ref 19–32)
CREATININE: 0.9 mg/dL (ref 0.4–1.2)
Chloride: 102 mEq/L (ref 96–112)
GFR: 59.92 mL/min — ABNORMAL LOW (ref >60.00)
GLUCOSE: 96 mg/dL (ref 70–99)
Potassium: 6 mEq/L — ABNORMAL HIGH (ref 3.5–5.1)
Sodium: 140 mEq/L (ref 135–145)

## 2014-04-05 LAB — POCT URINALYSIS DIPSTICK
Bilirubin, UA: NEGATIVE
Blood, UA: NEGATIVE
Glucose, UA: NEGATIVE
Ketones, UA: NEGATIVE
Leukocytes, UA: NEGATIVE
Nitrite, UA: NEGATIVE
PH UA: 7.5
SPEC GRAV UA: 1.015
Urobilinogen, UA: 0.2

## 2014-04-05 LAB — HEPATIC FUNCTION PANEL
ALK PHOS: 85 U/L (ref 39–117)
ALT: 12 U/L (ref 0–35)
AST: 24 U/L (ref 0–37)
Albumin: 3.6 g/dL (ref 3.5–5.2)
BILIRUBIN DIRECT: 0.1 mg/dL (ref 0.0–0.3)
BILIRUBIN TOTAL: 0.6 mg/dL (ref 0.2–1.2)
Total Protein: 7.2 g/dL (ref 6.0–8.3)

## 2014-04-05 LAB — TSH: TSH: 2.99 u[IU]/mL (ref 0.35–4.50)

## 2014-04-12 ENCOUNTER — Ambulatory Visit (INDEPENDENT_AMBULATORY_CARE_PROVIDER_SITE_OTHER): Payer: Medicare Other | Admitting: Family Medicine

## 2014-04-12 ENCOUNTER — Encounter: Payer: Self-pay | Admitting: Family Medicine

## 2014-04-12 VITALS — BP 138/78 | HR 60 | Temp 97.6°F | Ht 60.5 in | Wt 141.0 lb

## 2014-04-12 DIAGNOSIS — I10 Essential (primary) hypertension: Secondary | ICD-10-CM

## 2014-04-12 DIAGNOSIS — Z0000 Encounter for general adult medical examination without abnormal findings: Secondary | ICD-10-CM

## 2014-04-12 MED ORDER — POTASSIUM CHLORIDE CRYS ER 20 MEQ PO TBCR: 20.0000 meq | EXTENDED_RELEASE_TABLET | Freq: Every day | ORAL | Status: DC

## 2014-04-12 MED ORDER — ISRADIPINE 2.5 MG PO CAPS: 2.5000 mg | ORAL_CAPSULE | Freq: Every day | ORAL | Status: DC

## 2014-04-12 MED ORDER — OXYBUTYNIN CHLORIDE ER 5 MG PO TB24: 5.0000 mg | ORAL_TABLET | Freq: Every day | ORAL | Status: DC

## 2014-04-12 MED ORDER — FUROSEMIDE 20 MG PO TABS: ORAL_TABLET | ORAL | Status: DC

## 2014-04-12 MED ORDER — ESOMEPRAZOLE MAGNESIUM 40 MG PO PACK: 40.0000 mg | PACK | Freq: Every day | ORAL | Status: DC

## 2014-04-12 MED ORDER — HYDROCODONE-ACETAMINOPHEN 5-325 MG PO TABS: 1.0000 | ORAL_TABLET | Freq: Four times a day (QID) | ORAL | Status: DC | PRN

## 2014-04-12 MED ORDER — VALSARTAN-HYDROCHLOROTHIAZIDE 320-25 MG PO TABS: 1.0000 | ORAL_TABLET | Freq: Every day | ORAL | Status: DC

## 2014-04-12 MED ORDER — SIMVASTATIN 40 MG PO TABS: 40.0000 mg | ORAL_TABLET | Freq: Every day | ORAL | Status: DC

## 2014-04-12 MED ORDER — METOPROLOL TARTRATE 50 MG PO TABS: 50.0000 mg | ORAL_TABLET | Freq: Every day | ORAL | Status: DC

## 2014-04-12 NOTE — Progress Notes (Signed)
   Subjective:    Patient ID: Shirley Wright, female    DOB: 1925/09/06, 80 y.o.   MRN: [id number]  HPI 80 yr old female for a cpx. She is doing well. She has no concerns.    Review of Systems  Constitutional: Negative.   HENT: Negative.   Eyes: Negative.   Respiratory: Negative.   Cardiovascular: Negative.   Gastrointestinal: Negative.   Genitourinary: Negative for dysuria, urgency, frequency, hematuria, flank pain, decreased urine volume, enuresis, difficulty urinating, pelvic pain and dyspareunia.  Musculoskeletal: Negative.   Skin: Negative.   Neurological: Negative.   Psychiatric/Behavioral: Negative.        Objective:   Physical Exam  Constitutional: She is oriented to person, place, and time. She appears well-developed and well-nourished. No distress.  HENT:  Head: Normocephalic and atraumatic.  Right Ear: External ear normal.  Left Ear: External ear normal.  Nose: Nose normal.  Mouth/Throat: Oropharynx is clear and moist. No oropharyngeal exudate.  Eyes: Conjunctivae and EOM are normal. Pupils are equal, round, and reactive to light. No scleral icterus.  Neck: Normal range of motion. Neck supple. No JVD present. No thyromegaly present.  Cardiovascular: Normal rate, regular rhythm, normal heart sounds and intact distal pulses.  Exam reveals no gallop and no friction rub.   No murmur heard. ekg normal   Pulmonary/Chest: Effort normal and breath sounds normal. No respiratory distress. She has no wheezes. She has no rales. She exhibits no tenderness.  Abdominal: Soft. Bowel sounds are normal. She exhibits no distension and no mass. There is no tenderness. There is no rebound and no guarding.  Musculoskeletal: Normal range of motion. She exhibits no edema or tenderness.  Lymphadenopathy:    She has no cervical adenopathy.  Neurological: She is alert and oriented to person, place, and time. She has normal reflexes. No cranial nerve deficit. She exhibits normal muscle  tone. Coordination normal.  Skin: Skin is warm and dry. No rash noted. No erythema.  Psychiatric: She has a normal mood and affect. Her behavior is normal. Judgment and thought content normal.          Assessment & Plan:  Well exam.

## 2014-04-12 NOTE — Progress Notes (Signed)
Pre visit review using our clinic review tool, if applicable. No additional management support is needed unless otherwise documented below in the visit note. 

## 2014-04-13 ENCOUNTER — Telehealth: Payer: Self-pay | Admitting: Family Medicine

## 2014-04-13 NOTE — Telephone Encounter (Signed)
emmi mailed  °

## 2014-05-17 ENCOUNTER — Other Ambulatory Visit: Payer: Self-pay | Admitting: Family Medicine

## 2014-06-04 ENCOUNTER — Encounter: Payer: Self-pay | Admitting: Gastroenterology

## 2014-06-04 ENCOUNTER — Telehealth: Payer: Self-pay | Admitting: Internal Medicine

## 2014-06-04 NOTE — Telephone Encounter (Signed)
Pts daughter states her mother is having problems with diarrhea and vomiting at night. Requesting sooner appt. Pt scheduled to see Amy Esterwood PA 06/09/14@1 :30pm. Pt aware of appt.

## 2014-06-09 ENCOUNTER — Ambulatory Visit (INDEPENDENT_AMBULATORY_CARE_PROVIDER_SITE_OTHER): Payer: Medicare Other | Admitting: Physician Assistant

## 2014-06-09 ENCOUNTER — Encounter: Payer: Self-pay | Admitting: Physician Assistant

## 2014-06-09 ENCOUNTER — Other Ambulatory Visit (INDEPENDENT_AMBULATORY_CARE_PROVIDER_SITE_OTHER): Payer: Medicare Other

## 2014-06-09 VITALS — BP 148/68 | HR 60 | Ht 60.05 in | Wt 143.0 lb

## 2014-06-09 DIAGNOSIS — G8929 Other chronic pain: Secondary | ICD-10-CM

## 2014-06-09 DIAGNOSIS — R110 Nausea: Secondary | ICD-10-CM

## 2014-06-09 DIAGNOSIS — R1032 Left lower quadrant pain: Secondary | ICD-10-CM

## 2014-06-09 DIAGNOSIS — R197 Diarrhea, unspecified: Secondary | ICD-10-CM

## 2014-06-09 DIAGNOSIS — R1031 Right lower quadrant pain: Secondary | ICD-10-CM

## 2014-06-09 LAB — CBC WITH DIFFERENTIAL/PLATELET
BASOS ABS: 0 10*3/uL (ref 0.0–0.1)
Basophils Relative: 0.3 % (ref 0.0–3.0)
Eosinophils Absolute: 0.3 10*3/uL (ref 0.0–0.7)
Eosinophils Relative: 4.1 % (ref 0.0–5.0)
HEMATOCRIT: 39.5 % (ref 36.0–46.0)
HEMOGLOBIN: 13.2 g/dL (ref 12.0–15.0)
LYMPHS PCT: 21.6 % (ref 12.0–46.0)
Lymphs Abs: 1.6 10*3/uL (ref 0.7–4.0)
MCHC: 33.5 g/dL (ref 30.0–36.0)
MCV: 90.8 fl (ref 78.0–100.0)
MONOS PCT: 7.8 % (ref 3.0–12.0)
Monocytes Absolute: 0.6 10*3/uL (ref 0.1–1.0)
Neutro Abs: 5 10*3/uL (ref 1.4–7.7)
Neutrophils Relative %: 66.2 % (ref 43.0–77.0)
PLATELETS: 287 10*3/uL (ref 150.0–400.0)
RBC: 4.35 Mil/uL (ref 3.87–5.11)
RDW: 13.4 % (ref 11.5–15.5)
WBC: 7.6 10*3/uL (ref 4.0–10.5)

## 2014-06-09 LAB — COMPREHENSIVE METABOLIC PANEL
ALT: 10 U/L (ref 0–35)
AST: 17 U/L (ref 0–37)
Albumin: 3.6 g/dL (ref 3.5–5.2)
Alkaline Phosphatase: 83 U/L (ref 39–117)
BUN: 19 mg/dL (ref 6–23)
CALCIUM: 9.4 mg/dL (ref 8.4–10.5)
CHLORIDE: 97 meq/L (ref 96–112)
CO2: 33 meq/L — AB (ref 19–32)
Creatinine, Ser: 0.82 mg/dL (ref 0.40–1.20)
GFR: 70.12 mL/min (ref >60.00)
GLUCOSE: 112 mg/dL — AB (ref 70–99)
Potassium: 3.8 mEq/L (ref 3.5–5.1)
Sodium: 132 mEq/L — ABNORMAL LOW (ref 135–145)
Total Bilirubin: 0.4 mg/dL (ref 0.2–1.2)
Total Protein: 6.9 g/dL (ref 6.0–8.3)

## 2014-06-09 LAB — SEDIMENTATION RATE: Sed Rate: 36 mm/hr — ABNORMAL HIGH (ref 0–22)

## 2014-06-09 LAB — C-REACTIVE PROTEIN: CRP: 1.1 mg/dL (ref 0.5–20.0)

## 2014-06-09 MED ORDER — GLYCOPYRROLATE 2 MG PO TABS: ORAL_TABLET | ORAL | Status: DC

## 2014-06-09 MED ORDER — ONDANSETRON 4 MG PO TBDP: ORAL_TABLET | ORAL | Status: DC

## 2014-06-09 NOTE — Patient Instructions (Addendum)
We sent prescriptions to [facility]. 1. Zofran 4 mg 2. Robinul Forte 2 mg  We have given you a sample of Align- a proboitic. Take 1 capsule daily. You can get this at [facility].  We made you an appointment with Dr. Zenovia Jarred on 08-02-2014 at 2:45 AM.   Avoid artificial sweetners.   You have been scheduled for a CT scan of the abdomen and pelvis at [facility] ([address]---this is in the same building as [facility]).   You are scheduled on Wednesday 06-16-2014 at 11:30 am. You should arrive at 11:15 am  prior to your appointment time for registration. Please follow the written instructions below on the day of your exam:  WARNING: IF YOU ARE ALLERGIC TO IODINE/X-RAY DYE, PLEASE NOTIFY RADIOLOGY IMMEDIATELY AT [phone]! YOU WILL BE GIVEN A 13 HOUR PREMEDICATION PREP.  1) Do not eat or drink anything after 9:30 am  (4 hours prior to your test) 2) You have been given 2 bottles of oral contrast to drink. The solution may taste  better if refrigerated, but do NOT add ice or any other liquid to this solution. Shake  well before drinking.    Drink 1 bottle of contrast @ 9:30 am  (2 hours prior to your exam)  Drink 1 bottle of contrast @ 10:30 am  (1 hour prior to your exam)  You may take any medications as prescribed with a small amount of water except for the following: Metformin, Glucophage, Glucovance, Avandamet, Riomet, Fortamet, Actoplus Met, Janumet, Glumetza or Metaglip. The above medications must be held the day of the exam AND 48 hours after the exam.  The purpose of you drinking the oral contrast is to aid in the visualization of your intestinal tract. The contrast solution may cause some diarrhea. Before your exam is started, you will be given a small amount of fluid to drink. Depending on your individual set of symptoms, you may also receive an intravenous injection of x-ray contrast/dye. Plan on being at [facility] for 30 minutes or  long, depending on the type of exam you are having performed.  If you have any questions regarding your exam or if you need to reschedule, you may call the CT department at [phone] between the hours of 8:00 am and 5:00 pm, Monday-Friday.  ________________________________________________________________________

## 2014-06-09 NOTE — Progress Notes (Addendum)
Patient ID: Shirley Wright, female   DOB: Jun 16, 1925, 81 y.o.   MRN: [id number]   Subjective:    Patient ID: Shirley Wright, female    DOB: 1925-07-09, 81 y.o.   MRN: [id number]  HPI Shirley Wright is a pleasant 81 year old white female known to Dr.Pyrtle. She has history of hypertension, hyperlipidemia, GERD, diverticulosis, and IBS. She status post remote hysterectomy and had a lumbar laminectomy. She was last seen in 2013 and generally has been seen for problems with constipation. She last had colonoscopy in June 2011 was noted to have extensive rectosigmoid diverticulosis at that time. Patient comes in now with complaints of episodic diarrhea. He says she's been having problems over the past year. She says she will go for 2 or 3 days and have fairly normal bowel movements and then have a day of frequent loose stools. She has not noticed any blood. She does have some discomfort across her lower abdomen with these episodes. Over the past month or so she has had 3 "attacks" of nausea followed by eventual vomiting and diarrhea. These episodes have all occurred in the evenings. This is been more severe than her frequent episodes of loose stools. Is not on any new medications has not had any recent antibiotics, generally does not use any artificial sweeteners, appetite has been fine and weight has been stable. She's not aware of any specific triggers ,or  food triggers.  Review of Systems Pertinent positive and negative review of systems were noted in the above HPI section.  All other review of systems was otherwise negative.  Outpatient Encounter Prescriptions as of 06/09/2014  Medication Sig  . aspirin 81 MG tablet Take 81 mg by mouth daily.    Marland Kitchen esomeprazole (NEXIUM) 40 MG packet Take 40 mg by mouth daily before breakfast.  . HYDROcodone-acetaminophen (NORCO/VICODIN) 5-325 MG per tablet Take 1 tablet by mouth every 6 (six) hours as needed for moderate pain.  Marland Kitchen isradipine (DYNACIRC) 2.5 MG capsule Take 1  capsule (2.5 mg total) by mouth daily.  . metoprolol (LOPRESSOR) 50 MG tablet Take 1 tablet (50 mg total) by mouth daily.  Marland Kitchen oxybutynin (DITROPAN-XL) 5 MG 24 hr tablet Take 1 tablet (5 mg total) by mouth daily.  . polyethylene glycol powder (GLYCOLAX/MIRALAX) powder Take 17 g by mouth daily.  . potassium chloride SA (KLOR-CON M20) 20 MEQ tablet Take 1 tablet (20 mEq total) by mouth daily.  . simvastatin (ZOCOR) 40 MG tablet Take 1 tablet (40 mg total) by mouth at bedtime.  . valsartan-hydrochlorothiazide (DIOVAN HCT) 320-25 MG per tablet Take 1 tablet by mouth daily.  Marland Kitchen glycopyrrolate (ROBINUL-FORTE) 2 MG tablet Take 1 tab every morning.  . ondansetron (ZOFRAN ODT) 4 MG disintegrating tablet Place on the tongue to dissolve every 6 hours as needed for nausea.  . [DISCONTINUED] furosemide (LASIX) 20 MG tablet TAKE 1 TABLET (20 MG TOTAL) BY MOUTH DAILY.   Allergies  Allergen Reactions  . Penicillins     REACTION: unspecified  . Pneumococcal Vaccine Polyvalent     REACTION: unspecified  . Tetanus-Diphtheria Toxoids Td     REACTION: unspecified   Patient Active Problem List   Diagnosis Date Noted  . GERD 04/16/2007  . IRRITABLE BOWEL SYNDROME 04/16/2007  . OSTEOARTHRITIS 04/16/2007  . BACK PAIN, LUMBAR 04/16/2007  . COLONIC POLYPS, HX OF 04/16/2007  . HYPERLIPIDEMIA 01/27/2007  . HYPERTENSION 01/27/2007  . ALLERGIC RHINITIS 01/27/2007   History   Social History  . Marital Status: Married  Spouse Name: N/A    Number of Children: N/A  . Years of Education: N/A   Occupational History  . Not on file.   Social History Main Topics  . Smoking status: Never Smoker   . Smokeless tobacco: Never Used  . Alcohol Use: No     Comment: once every 2-3 months  . Drug Use: No  . Sexual Activity: Not on file   Other Topics Concern  . Not on file   Social History Narrative    Shirley Wright's family history includes Arthritis in an other family member; Diabetes in an other family  member; Heart disease in an other family member; Hypertension in an other family member; Lung cancer in an other family member.      Objective:    Filed Vitals:   06/09/14 1343  BP: 148/68  Pulse: 60    Physical Exam well developed elderly white female in no acute distress accompanied by her daughter blood pressure 148/68 pulse 60 height 5 foot weight 143. HEENT; nontraumatic normocephalic EOMI PERRLA sclera anicteric, Neck ;supple no JVD, Cardiovascular; regular rate and rhythm with S1-S2 no murmur or gallop, Pulmonary ;clear bilaterally, Abdomen; soft no focal tenderness no palpable mass or hepatosplenomegaly bowel sounds are active, Rectal; exam not done, Extremities; no clubbing cyanosis or edema skin warm and dry, Psych; mood and affect appropriate       Assessment & Plan:   #1 81 yo female with hx of IBS generally constipation predominant in the past now with frequent episodes of diarrhea occurring a couple of times per week, and 3 distinct episodes of nausea/vomiting followed by diarrhea. Etiology is not clear though suspect this is functional in nature. #2 extensive rectosigmoid diverticulosis #3 GERD #4 hypertension #5 hyperlipidemia  Plan; add a daily probiotic-patient was given samples of align Trial of Robinul Forte 2 mg every morning to see if an anti-spasmodic will avert these episodes Trial of Zofran 4 mg ODT at onset of nausea Schedule for CT scan of the abdomen and pelvis CBC, CMET ESR Bland diet no artificial sweeteners Follow-up with Dr.Pyrtle in 2-3 weeks or or myself sooner if needed.   Shirley Whiteman S Merick Kelleher PA-C 06/09/2014  Addendum: Reviewed and agree with initial management. Jerene Bears, MD

## 2014-06-16 ENCOUNTER — Inpatient Hospital Stay: Admission: RE | Admit: 2014-06-16 | Payer: Medicare Other | Source: Ambulatory Visit

## 2014-06-24 ENCOUNTER — Ambulatory Visit (INDEPENDENT_AMBULATORY_CARE_PROVIDER_SITE_OTHER)
Admission: RE | Admit: 2014-06-24 | Discharge: 2014-06-24 | Disposition: A | Discharge status: Home / Self Care | Payer: Medicare Other | Source: Ambulatory Visit | Attending: Physician Assistant | Admitting: Physician Assistant

## 2014-06-24 DIAGNOSIS — R1031 Right lower quadrant pain: Secondary | ICD-10-CM

## 2014-06-24 DIAGNOSIS — R1032 Left lower quadrant pain: Secondary | ICD-10-CM

## 2014-06-24 DIAGNOSIS — G8929 Other chronic pain: Secondary | ICD-10-CM

## 2014-06-24 DIAGNOSIS — R110 Nausea: Secondary | ICD-10-CM

## 2014-06-24 DIAGNOSIS — R197 Diarrhea, unspecified: Secondary | ICD-10-CM

## 2014-06-24 MED ORDER — IOHEXOL 300 MG/ML  SOLN
100.0000 mL | Freq: Once | INTRAMUSCULAR | Status: AC | PRN
  Administered 2014-06-24: 100 mL via INTRAVENOUS

## 2014-06-25 ENCOUNTER — Other Ambulatory Visit: Payer: Self-pay

## 2014-06-25 MED ORDER — MOVIPREP 100 G PO SOLR: ORAL | Status: DC

## 2014-07-14 ENCOUNTER — Encounter: Payer: Self-pay | Admitting: *Deleted

## 2014-08-02 ENCOUNTER — Ambulatory Visit (INDEPENDENT_AMBULATORY_CARE_PROVIDER_SITE_OTHER): Payer: Medicare Other | Admitting: Internal Medicine

## 2014-08-02 ENCOUNTER — Encounter: Payer: Self-pay | Admitting: Internal Medicine

## 2014-08-02 VITALS — BP 126/58 | HR 64 | Ht 60.0 in | Wt 137.2 lb

## 2014-08-02 DIAGNOSIS — R1032 Left lower quadrant pain: Secondary | ICD-10-CM

## 2014-08-02 DIAGNOSIS — K5900 Constipation, unspecified: Secondary | ICD-10-CM

## 2014-08-02 DIAGNOSIS — K5730 Diverticulosis of large intestine without perforation or abscess without bleeding: Secondary | ICD-10-CM

## 2014-08-02 DIAGNOSIS — R1031 Right lower quadrant pain: Secondary | ICD-10-CM

## 2014-08-02 MED ORDER — ONDANSETRON 4 MG PO TBDP: ORAL_TABLET | ORAL | Status: DC

## 2014-08-02 NOTE — Progress Notes (Signed)
Subjective:    Patient ID: Shirley Wright, female    DOB: [birth year], 81 y.o.   MRN: [id number]  HPI Shirley Wright is an 81 yo female with PMH of colon polyps, left-sided diverticulosis, IBS, GERD, hypertension and hyperlipidemia, lumbar disc disease, who is seen for follow-up. She saw Nicoletta Ba, PA-C on 06/09/2014 to evaluate episodic diarrhea associated with nausea vomiting along with constipation. Before bowel movements she was having considerable cramping in the lower abdomen particularly on the left. After this visit it was expected that her symptoms were functional in nature worsened by extensive rectosigmoid diverticulosis. Zofran was given to be used as needed for nausea, Robinul forte was given as an anti-spasmodic, and CT scan was ordered of the abdomen and pelvis.  Today overall she reports she is feeling better. She has used Zofran on an as-needed basis and this has completely alleviated the nausea. She has used approximate 10 tablets in one month. She's had no further vomiting. She feels like the constipation is better and she is having one to 3 bowel movements a day. Stools are somewhat thin in nature but she denies blood and melena. She does continue to have lower abdominal cramping preceding bowel movement. She is not using MiraLAX as often as before. She tried Robinul Forte but had an extremely dry mouth and even though it helped the lower cramping pain she could not tolerate the dry mouth. She reports a good appetite without early satiety. She feels she is eating normally. Weight today is down 6 pounds from January which surprises both her and her daughter  CT scan of the abdomen and pelvis with contrast was performed on 06/24/2014 and was extensively reviewed with the patient today. It showed considerable sigmoid diverticulosis without active diverticulitis. There was prominence of stool throughout the colon proximal to the sigmoid favoring constipation. There was wall  thickening in the sigmoid felt secondary to advanced diverticulosis. There was chronic prominence of the central mesenteric lymph nodes favored to be chronic and reactive. Degenerative arthropathy of the hips right greater than left with postoperative findings in the lumbar spine. Several benign appearing hepatic cysts which were stable. Nodularity in the left adrenal gland probably tiny adenoma. --After this test Amy Esterwood, PA-C recommended MiraLAX purge for constipation but the patient did not do this.  Review of Systems As per history of present illness, otherwise negative  Current Medications, Allergies, Past Medical History, Past Surgical History, Family History and Social History were reviewed in [facility] record.     Objective:   Physical Exam BP 126/58 mmHg  Pulse 64  Ht 5' (1.524 m)  Wt 137 lb 4 oz (62.256 kg)  BMI 26.80 kg/m2 Constitutional: Well-developed and well-nourished. No distress. HEENT: Normocephalic and atraumatic. Oropharynx is clear and moist. No oropharyngeal exudate. Conjunctivae are normal.  No scleral icterus. Neck: Neck supple. Trachea midline. Cardiovascular: Normal rate, regular rhythm and intact distal pulses. No M/R/G Pulmonary/chest: Effort normal and breath sounds normal. No wheezing, rales or rhonchi. Abdominal: Soft, nontender, nondistended. Bowel sounds active throughout. There are no masses palpable. No hepatosplenomegaly. Extremities: no clubbing, cyanosis, or edema Lymphadenopathy: No cervical adenopathy noted. Neurological: Alert and oriented to person place and time. Skin: Skin is warm and dry. No rashes noted. Psychiatric: Normal mood and affect. Behavior is normal.  CT abd/pelvis -- see hpi  CBC    Component Value Date/Time   WBC 7.6 06/09/2014 1451   RBC 4.35 06/09/2014 1451   HGB 13.2 06/09/2014  1451   HCT 39.5 06/09/2014 1451   PLT 287.0 06/09/2014 1451   MCV 90.8 06/09/2014 1451   MCHC 33.5 06/09/2014  1451   RDW 13.4 06/09/2014 1451   LYMPHSABS 1.6 06/09/2014 1451   MONOABS 0.6 06/09/2014 1451   EOSABS 0.3 06/09/2014 1451   BASOSABS 0.0 06/09/2014 1451    CMP     Component Value Date/Time   NA 132* 06/09/2014 1451   K 3.8 06/09/2014 1451   CL 97 06/09/2014 1451   CO2 33* 06/09/2014 1451   GLUCOSE 112* 06/09/2014 1451   BUN 19 06/09/2014 1451   CREATININE 0.82 06/09/2014 1451   CALCIUM 9.4 06/09/2014 1451   PROT 6.9 06/09/2014 1451   ALBUMIN 3.6 06/09/2014 1451   AST 17 06/09/2014 1451   ALT 10 06/09/2014 1451   ALKPHOS 83 06/09/2014 1451   BILITOT 0.4 06/09/2014 1451   GFRNONAA 63.65 01/25/2010 0931   GFRAA 77 11/25/2007 1119    Erythrocyte Sedimentation Rate     Component Value Date/Time   ESRSEDRATE 36* 06/09/2014 1451   Colonoscopy reviewed perform a Dr. Earlean Shawl 2011 -- severe rectosigmoid diverticulosis which was very hard traverse with the colonoscope though after change in patient position the scope was advanced to the cecum. Adenoma removed from the ascending colon, otherwise normal exam    Assessment & Plan:  81 yo female with PMH of colon polyps, left-sided diverticulosis, IBS, GERD, hypertension and hyperlipidemia, lumbar disc disease, who is seen for follow-up. She saw Nicoletta Ba, PA-C on 06/09/2014 to evaluate episodic diarrhea associated with nausea vomiting along with constipation.  1. Lower abd pain/constipation/extensive recto-sigmoid diverticulosis -- after reviewing the CT it is my impression that she likely has constipation leading to colonic spasm as the colon is trying to empty. It is likely hard for her to pass hard stool through the extensive rectosigmoid diverticulosis. To this regard I have recommended that she begin MiraLAX 17 g on a daily and regular basis in an attempt to prevent constipation and lower abdominal cramping. The nausea, vomiting and diarrhea episodes were likely secondary to overflow diarrhea and severe abdominal cramping in the  setting of constipation and diverticulosis. These episodes have resolved. We have discussed that I cannot exclude a mass lesion in the left colon, but this is felt to be unlikely given prior colonoscopy findings and imaging. She understands that repeat colonoscopy is the only way to exclude a mass lesion, but she does not want to pursue repeat colonoscopy which is understandable. I will see her back in 3 months time, sooner if necessary. She can continue Zofran on an as-needed basis for nausea. If cramping pain does not improve with more regular bowel movements using MiraLAX, I asked that she notify me. She and her daughter voice understanding. Of note there is no evidence for anemia with CBC at last office visit, this is reassuring.

## 2014-08-02 NOTE — Patient Instructions (Signed)
Please purchase the following medications over the counter and take as directed: Miralax 17 grams (1 capful) dissolved in at least 8 ounces water/juice daily  We have sent the following medications to your pharmacy for you to pick up at your convenience: Zofran  Please follow up with Dr Hilarie Fredrickson in 3 months.

## 2014-08-03 ENCOUNTER — Other Ambulatory Visit: Payer: Self-pay | Admitting: Physician Assistant

## 2014-08-03 ENCOUNTER — Telehealth: Payer: Self-pay | Admitting: *Deleted

## 2014-08-03 NOTE — Telephone Encounter (Signed)
When this patient saw Nicoletta Ba PA in our office in January 2016 ,she was prescribed the Robinul Forte.  The patient saw Dr. Hilarie Fredrickson on 08-02-2014.  She told him the Robinul Forte helped but gives her a dry mouth.  I asked if she wants me to have this prescription filled for her and she said yes. She said she will rinse her mouth with the Biotin mouthwash that helps with that.

## 2014-09-01 ENCOUNTER — Telehealth: Payer: Self-pay | Admitting: Family Medicine

## 2014-09-01 NOTE — Telephone Encounter (Signed)
PA for Oxybutynin ER was denied. Patient must try and fail 2 of the formulary alternatives:  Tolterodine SR capsule, Toviaz, Vesicare, Myrbetriq, Oxybutynin Chloride immediate release 5 mg tablet.

## 2014-09-01 NOTE — Telephone Encounter (Signed)
Switch to Vesicare 5 mg to use daily, call in one year supply

## 2014-09-02 MED ORDER — SOLIFENACIN SUCCINATE 5 MG PO TABS: 5.0000 mg | ORAL_TABLET | Freq: Every day | ORAL | Status: DC

## 2014-09-02 NOTE — Telephone Encounter (Signed)
I spoke with pt, sent script e-scribe and updated pt's medication list.

## 2014-09-07 ENCOUNTER — Telehealth: Payer: Self-pay | Admitting: Family Medicine

## 2014-09-07 ENCOUNTER — Other Ambulatory Visit: Payer: Self-pay | Admitting: Family Medicine

## 2014-09-07 NOTE — Telephone Encounter (Signed)
Pt request refill  simvastatin (ZOCOR) 40 MG table  [facility]

## 2014-09-07 NOTE — Telephone Encounter (Signed)
Pt received  Notice her plan will no longer cover these 3 meds. May get tier exception.   isradipine (DYNACIRC) 2.5 MG capsule  valsartan-hydrochlorothiazide (DIOVAN HCT) 320-25 MG per tablet   oxybutynin (DITROPAN-XL) 5 MG 24 hr tablet

## 2014-09-08 NOTE — Telephone Encounter (Signed)
PA's submitted for isradipine and diovan.  Oxybutynin was already changed to Vesicare.

## 2014-09-09 MED ORDER — SIMVASTATIN 40 MG PO TABS: 40.0000 mg | ORAL_TABLET | Freq: Every day | ORAL | Status: DC

## 2014-09-09 NOTE — Telephone Encounter (Signed)
I sent script e-scribe. 

## 2014-09-09 NOTE — Telephone Encounter (Signed)
Isradipine 2.5MG  capsules, PA was approved.

## 2014-09-09 NOTE — Telephone Encounter (Signed)
PA for isradipine was Approved.   PA for valsartan-hctz was denied.  Patient's plan will cover irbesartan hctz 150-12.5 or 300-12.5, telmisartan hctz 40-12.5,80-12.5, 80-25, or prescribe valsartan and hctz seperate.

## 2014-09-10 NOTE — Telephone Encounter (Signed)
Separate the Valsartan 320 mg and the HCTZ 25 mg to take one of each daily. Give a one year supply please

## 2014-09-13 MED ORDER — VALSARTAN 320 MG PO TABS: 320.0000 mg | ORAL_TABLET | Freq: Every day | ORAL | Status: DC

## 2014-09-13 MED ORDER — HYDROCHLOROTHIAZIDE 25 MG PO TABS: 25.0000 mg | ORAL_TABLET | Freq: Every day | ORAL | Status: DC

## 2014-09-13 NOTE — Telephone Encounter (Signed)
Left a message for pt to return call.  Prescriptions for medication sent to pharmacy.

## 2014-09-14 NOTE — Telephone Encounter (Signed)
I spoke with pt and she will pick up new scripts.

## 2014-09-26 ENCOUNTER — Other Ambulatory Visit: Payer: Self-pay | Admitting: Physician Assistant

## 2014-10-05 ENCOUNTER — Other Ambulatory Visit: Payer: Self-pay | Admitting: Family Medicine

## 2014-11-07 ENCOUNTER — Other Ambulatory Visit: Payer: Self-pay | Admitting: Physician Assistant

## 2014-11-16 ENCOUNTER — Telehealth: Payer: Self-pay | Admitting: Family Medicine

## 2014-11-16 NOTE — Telephone Encounter (Signed)
Pt request refill of the following: HYDROcodone-acetaminophen (NORCO/VICODIN) 5-325 MG per tablet ° ° °Phamacy: °

## 2014-11-17 MED ORDER — HYDROCODONE-ACETAMINOPHEN 5-325 MG PO TABS: 1.0000 | ORAL_TABLET | Freq: Four times a day (QID) | ORAL | Status: DC | PRN

## 2014-11-17 NOTE — Telephone Encounter (Signed)
Script is ready for pick up and I spoke with pt.  

## 2014-11-17 NOTE — Telephone Encounter (Signed)
done

## 2014-12-30 LAB — HM MAMMOGRAPHY

## 2015-01-03 ENCOUNTER — Encounter: Payer: Self-pay | Admitting: Family Medicine

## 2015-01-14 ENCOUNTER — Other Ambulatory Visit: Payer: Self-pay | Admitting: *Deleted

## 2015-01-14 MED ORDER — SIMVASTATIN 40 MG PO TABS: 40.0000 mg | ORAL_TABLET | Freq: Every day | ORAL | Status: DC

## 2015-01-14 NOTE — Telephone Encounter (Signed)
Simvastatin refilled. 

## 2015-01-17 ENCOUNTER — Other Ambulatory Visit: Payer: Self-pay | Admitting: Physician Assistant

## 2015-02-24 ENCOUNTER — Ambulatory Visit (INDEPENDENT_AMBULATORY_CARE_PROVIDER_SITE_OTHER): Payer: Medicare Other

## 2015-02-24 DIAGNOSIS — Z23 Encounter for immunization: Secondary | ICD-10-CM

## 2015-03-07 ENCOUNTER — Other Ambulatory Visit: Payer: Self-pay | Admitting: Physician Assistant

## 2015-03-23 ENCOUNTER — Other Ambulatory Visit: Payer: Self-pay | Admitting: Family Medicine

## 2015-03-23 MED ORDER — ISRADIPINE 2.5 MG PO CAPS: 2.5000 mg | ORAL_CAPSULE | Freq: Every day | ORAL | Status: DC

## 2015-04-12 ENCOUNTER — Other Ambulatory Visit (INDEPENDENT_AMBULATORY_CARE_PROVIDER_SITE_OTHER): Payer: Medicare Other

## 2015-04-12 ENCOUNTER — Other Ambulatory Visit: Payer: Medicare Other

## 2015-04-12 DIAGNOSIS — E785 Hyperlipidemia, unspecified: Secondary | ICD-10-CM | POA: Diagnosis not present

## 2015-04-12 DIAGNOSIS — R7989 Other specified abnormal findings of blood chemistry: Secondary | ICD-10-CM | POA: Diagnosis not present

## 2015-04-12 DIAGNOSIS — Z0000 Encounter for general adult medical examination without abnormal findings: Secondary | ICD-10-CM | POA: Diagnosis not present

## 2015-04-12 LAB — BASIC METABOLIC PANEL
BUN: 24 mg/dL — AB (ref 6–23)
CHLORIDE: 102 meq/L (ref 96–112)
CO2: 32 meq/L (ref 19–32)
CREATININE: 0.93 mg/dL (ref 0.40–1.20)
Calcium: 9.3 mg/dL (ref 8.4–10.5)
GFR: 60.53 mL/min (ref >60.00)
GLUCOSE: 94 mg/dL (ref 70–99)
POTASSIUM: 4.5 meq/L (ref 3.5–5.1)
Sodium: 140 mEq/L (ref 135–145)

## 2015-04-12 LAB — HEPATIC FUNCTION PANEL
ALT: 10 U/L (ref 0–35)
AST: 17 U/L (ref 0–37)
Albumin: 3.6 g/dL (ref 3.5–5.2)
Alkaline Phosphatase: 68 U/L (ref 39–117)
BILIRUBIN DIRECT: 0.1 mg/dL (ref 0.0–0.3)
BILIRUBIN TOTAL: 0.5 mg/dL (ref 0.2–1.2)
Total Protein: 6.7 g/dL (ref 6.0–8.3)

## 2015-04-12 LAB — CBC WITH DIFFERENTIAL/PLATELET
BASOS PCT: 0.4 % (ref 0.0–3.0)
Basophils Absolute: 0 10*3/uL (ref 0.0–0.1)
EOS ABS: 0.2 10*3/uL (ref 0.0–0.7)
EOS PCT: 3.1 % (ref 0.0–5.0)
HCT: 40.5 % (ref 36.0–46.0)
HEMOGLOBIN: 13.5 g/dL (ref 12.0–15.0)
LYMPHS ABS: 2.4 10*3/uL (ref 0.7–4.0)
Lymphocytes Relative: 29.3 % (ref 12.0–46.0)
MCHC: 33.3 g/dL (ref 30.0–36.0)
MCV: 95.6 fl (ref 78.0–100.0)
MONO ABS: 0.7 10*3/uL (ref 0.1–1.0)
Monocytes Relative: 8 % (ref 3.0–12.0)
NEUTROS ABS: 4.8 10*3/uL (ref 1.4–7.7)
Neutrophils Relative %: 59.2 % (ref 43.0–77.0)
PLATELETS: 217 10*3/uL (ref 150.0–400.0)
RBC: 4.24 Mil/uL (ref 3.87–5.11)
RDW: 13.6 % (ref 11.5–15.5)
WBC: 8.1 10*3/uL (ref 4.0–10.5)

## 2015-04-12 LAB — TSH: TSH: 3.33 u[IU]/mL (ref 0.35–4.50)

## 2015-04-12 LAB — LIPID PANEL
Cholesterol: 134 mg/dL (ref 0–200)
HDL: 37.9 mg/dL — AB (ref >39.00)
NonHDL: 96.32
Total CHOL/HDL Ratio: 4
Triglycerides: 205 mg/dL — ABNORMAL HIGH (ref 0.0–149.0)
VLDL: 41 mg/dL — ABNORMAL HIGH (ref 0.0–40.0)

## 2015-04-12 LAB — POCT URINALYSIS DIPSTICK
Bilirubin, UA: NEGATIVE
Blood, UA: NEGATIVE
Glucose, UA: NEGATIVE
KETONES UA: NEGATIVE
LEUKOCYTES UA: NEGATIVE
Nitrite, UA: NEGATIVE
PH UA: 7
PROTEIN UA: NEGATIVE
SPEC GRAV UA: 1.015
UROBILINOGEN UA: 0.2

## 2015-04-12 LAB — LDL CHOLESTEROL, DIRECT: LDL DIRECT: 62 mg/dL

## 2015-04-15 ENCOUNTER — Other Ambulatory Visit: Payer: Self-pay | Admitting: Family Medicine

## 2015-04-18 ENCOUNTER — Encounter: Payer: Self-pay | Admitting: Family Medicine

## 2015-04-18 ENCOUNTER — Ambulatory Visit (INDEPENDENT_AMBULATORY_CARE_PROVIDER_SITE_OTHER): Payer: Medicare Other | Admitting: Family Medicine

## 2015-04-18 VITALS — BP 120/68 | HR 53 | Temp 98.1°F | Ht 60.0 in | Wt 139.0 lb

## 2015-04-18 DIAGNOSIS — Z0000 Encounter for general adult medical examination without abnormal findings: Secondary | ICD-10-CM

## 2015-04-18 MED ORDER — POTASSIUM CHLORIDE CRYS ER 20 MEQ PO TBCR: 20.0000 meq | EXTENDED_RELEASE_TABLET | Freq: Every day | ORAL | Status: DC

## 2015-04-18 MED ORDER — VALSARTAN 320 MG PO TABS: 320.0000 mg | ORAL_TABLET | Freq: Every day | ORAL | Status: DC

## 2015-04-18 MED ORDER — ESOMEPRAZOLE MAGNESIUM 40 MG PO PACK: 40.0000 mg | PACK | Freq: Every day | ORAL | Status: DC

## 2015-04-18 MED ORDER — SIMVASTATIN 40 MG PO TABS: 40.0000 mg | ORAL_TABLET | Freq: Every day | ORAL | Status: DC

## 2015-04-18 MED ORDER — ISRADIPINE 2.5 MG PO CAPS: 2.5000 mg | ORAL_CAPSULE | Freq: Every day | ORAL | Status: DC

## 2015-04-18 MED ORDER — SOLIFENACIN SUCCINATE 5 MG PO TABS: 5.0000 mg | ORAL_TABLET | Freq: Every day | ORAL | Status: DC

## 2015-04-18 MED ORDER — METOPROLOL TARTRATE 50 MG PO TABS: ORAL_TABLET | ORAL | Status: DC

## 2015-04-18 NOTE — Progress Notes (Signed)
Pre visit review using our clinic review tool, if applicable. No additional management support is needed unless otherwise documented below in the visit note. 

## 2015-04-18 NOTE — Progress Notes (Signed)
   Subjective:    Patient ID: Shirley Wright, female    DOB: 1925-07-02, 81 y.o.   MRN: [id number]  HPI 81 yr old female for a cpx. She feels well.    Review of Systems  Constitutional: Negative.   HENT: Negative.   Eyes: Negative.   Respiratory: Negative.   Cardiovascular: Negative.   Gastrointestinal: Negative.   Genitourinary: Negative for dysuria, urgency, frequency, hematuria, flank pain, decreased urine volume, enuresis, difficulty urinating, pelvic pain and dyspareunia.  Musculoskeletal: Negative.   Skin: Negative.   Neurological: Negative.   Psychiatric/Behavioral: Negative.        Objective:   Physical Exam  Constitutional: She is oriented to person, place, and time. She appears well-developed and well-nourished. No distress.  HENT:  Head: Normocephalic and atraumatic.  Right Ear: External ear normal.  Left Ear: External ear normal.  Nose: Nose normal.  Mouth/Throat: Oropharynx is clear and moist. No oropharyngeal exudate.  Eyes: Conjunctivae and EOM are normal. Pupils are equal, round, and reactive to light. No scleral icterus.  Neck: Normal range of motion. Neck supple. No JVD present. No thyromegaly present.  Cardiovascular: Normal rate, regular rhythm, normal heart sounds and intact distal pulses.  Exam reveals no gallop and no friction rub.   No murmur heard. EKG normal   Pulmonary/Chest: Effort normal and breath sounds normal. No respiratory distress. She has no wheezes. She has no rales. She exhibits no tenderness.  Abdominal: Soft. Bowel sounds are normal. She exhibits no distension and no mass. There is no tenderness. There is no rebound and no guarding.  Musculoskeletal: Normal range of motion. She exhibits no edema or tenderness.  Lymphadenopathy:    She has no cervical adenopathy.  Neurological: She is alert and oriented to person, place, and time. She has normal reflexes. No cranial nerve deficit. She exhibits normal muscle tone. Coordination normal.    Skin: Skin is warm and dry. No rash noted. No erythema.  Psychiatric: She has a normal mood and affect. Her behavior is normal. Judgment and thought content normal.          Assessment & Plan:  Well exam/. We discussed diet and exercise. Stop the HCTZ.

## 2015-05-03 ENCOUNTER — Telehealth: Payer: Self-pay | Admitting: Family Medicine

## 2015-05-03 MED ORDER — HYDROCODONE-ACETAMINOPHEN 5-325 MG PO TABS: 1.0000 | ORAL_TABLET | Freq: Four times a day (QID) | ORAL | Status: DC | PRN

## 2015-05-03 NOTE — Telephone Encounter (Signed)
done

## 2015-05-03 NOTE — Telephone Encounter (Addendum)
Patient would like a refill on her Hydrocodone prescription.  Please send the prescription to [facility] on [address].

## 2015-05-04 NOTE — Telephone Encounter (Signed)
Script is ready for pick up and I spoke with pt.  

## 2015-07-08 ENCOUNTER — Other Ambulatory Visit: Payer: Self-pay | Admitting: Family Medicine

## 2015-08-18 ENCOUNTER — Other Ambulatory Visit: Payer: Self-pay | Admitting: Family Medicine

## 2015-09-13 ENCOUNTER — Other Ambulatory Visit: Payer: Self-pay | Admitting: *Deleted

## 2015-09-13 NOTE — Telephone Encounter (Signed)
Can we refill this? 

## 2015-09-13 NOTE — Telephone Encounter (Signed)
[facility] [phone] Refill Request glycopyrrolate (ROBINUL) 2 MG tablet

## 2015-09-13 NOTE — Telephone Encounter (Signed)
Call in #[phone] with 11 rf 

## 2015-09-14 MED ORDER — GLYCOPYRROLATE 2 MG PO TABS: ORAL_TABLET | ORAL | Status: DC

## 2015-09-14 NOTE — Telephone Encounter (Signed)
Rx has been sent  

## 2015-09-26 ENCOUNTER — Other Ambulatory Visit: Payer: Self-pay | Admitting: Family Medicine

## 2015-12-08 ENCOUNTER — Other Ambulatory Visit: Payer: Self-pay | Admitting: General Practice

## 2015-12-08 MED ORDER — HYDROCHLOROTHIAZIDE 25 MG PO TABS: ORAL_TABLET | ORAL | Status: DC

## 2016-02-06 ENCOUNTER — Telehealth: Payer: Self-pay | Admitting: Family Medicine

## 2016-02-06 MED ORDER — HYDROCODONE-ACETAMINOPHEN 5-325 MG PO TABS
1.0000 | ORAL_TABLET | Freq: Four times a day (QID) | ORAL | 0 refills | Status: DC | PRN
Start: 2016-02-06 — End: 2016-11-12

## 2016-02-06 NOTE — Telephone Encounter (Signed)
Pt needs new rx hydrocodone °

## 2016-02-06 NOTE — Telephone Encounter (Signed)
Done

## 2016-02-07 NOTE — Telephone Encounter (Signed)
Script is ready for pick up here at front office, tried to reach pt and no answer.  

## 2016-02-08 ENCOUNTER — Telehealth: Payer: Self-pay | Admitting: Family Medicine

## 2016-02-08 ENCOUNTER — Encounter: Payer: Self-pay | Admitting: Family Medicine

## 2016-02-08 DIAGNOSIS — Z1231 Encounter for screening mammogram for malignant neoplasm of breast: Secondary | ICD-10-CM | POA: Diagnosis not present

## 2016-02-08 NOTE — Telephone Encounter (Signed)
Script is ready for pick up and I spoke with pt.  

## 2016-02-08 NOTE — Telephone Encounter (Signed)
° ° ° ° ° °  Pt request refill of the following: ° °HYDROcodone-acetaminophen (NORCO/VICODIN) 5-325 MG tablet ° ° °Phamacy: °

## 2016-03-06 ENCOUNTER — Other Ambulatory Visit: Payer: Self-pay

## 2016-03-06 MED ORDER — SOLIFENACIN SUCCINATE 5 MG PO TABS: 5.0000 mg | ORAL_TABLET | Freq: Every day | ORAL | 0 refills | Status: DC

## 2016-03-29 ENCOUNTER — Other Ambulatory Visit: Payer: Self-pay | Admitting: Family Medicine

## 2016-04-02 ENCOUNTER — Other Ambulatory Visit: Payer: Self-pay | Admitting: Family Medicine

## 2016-04-05 ENCOUNTER — Other Ambulatory Visit: Payer: Self-pay | Admitting: Family Medicine

## 2016-04-11 ENCOUNTER — Other Ambulatory Visit (INDEPENDENT_AMBULATORY_CARE_PROVIDER_SITE_OTHER): Payer: Medicare Other

## 2016-04-11 DIAGNOSIS — Z0000 Encounter for general adult medical examination without abnormal findings: Secondary | ICD-10-CM

## 2016-04-11 LAB — BASIC METABOLIC PANEL
BUN: 18 mg/dL (ref 6–23)
CALCIUM: 9.5 mg/dL (ref 8.4–10.5)
CO2: 29 meq/L (ref 19–32)
CREATININE: 0.81 mg/dL (ref 0.40–1.20)
Chloride: 98 mEq/L (ref 96–112)
GFR: 70.82 mL/min (ref >60.00)
GLUCOSE: 90 mg/dL (ref 70–99)
Potassium: 3.6 mEq/L (ref 3.5–5.1)
SODIUM: 135 meq/L (ref 135–145)

## 2016-04-11 LAB — POC URINALSYSI DIPSTICK (AUTOMATED)
BILIRUBIN UA: NEGATIVE
Blood, UA: NEGATIVE
Glucose, UA: NEGATIVE
KETONES UA: NEGATIVE
LEUKOCYTES UA: NEGATIVE
Nitrite, UA: NEGATIVE
Protein, UA: NEGATIVE
Spec Grav, UA: 1.015
Urobilinogen, UA: 1
pH, UA: 7

## 2016-04-11 LAB — CBC WITH DIFFERENTIAL/PLATELET
BASOS ABS: 0 10*3/uL (ref 0.0–0.1)
Basophils Relative: 0.2 % (ref 0.0–3.0)
Eosinophils Absolute: 0.1 10*3/uL (ref 0.0–0.7)
Eosinophils Relative: 2.1 % (ref 0.0–5.0)
HCT: 41.6 % (ref 36.0–46.0)
Hemoglobin: 14.1 g/dL (ref 12.0–15.0)
LYMPHS ABS: 1.9 10*3/uL (ref 0.7–4.0)
Lymphocytes Relative: 28.8 % (ref 12.0–46.0)
MCHC: 33.8 g/dL (ref 30.0–36.0)
MCV: 93 fl (ref 78.0–100.0)
MONOS PCT: 7.8 % (ref 3.0–12.0)
Monocytes Absolute: 0.5 10*3/uL (ref 0.1–1.0)
NEUTROS ABS: 4.1 10*3/uL (ref 1.4–7.7)
NEUTROS PCT: 61.1 % (ref 43.0–77.0)
PLATELETS: 271 10*3/uL (ref 150.0–400.0)
RBC: 4.48 Mil/uL (ref 3.87–5.11)
RDW: 13.4 % (ref 11.5–15.5)
WBC: 6.6 10*3/uL (ref 4.0–10.5)

## 2016-04-11 LAB — HEPATIC FUNCTION PANEL
ALBUMIN: 3.7 g/dL (ref 3.5–5.2)
ALT: 10 U/L (ref 0–35)
AST: 16 U/L (ref 0–37)
Alkaline Phosphatase: 70 U/L (ref 39–117)
BILIRUBIN DIRECT: 0.1 mg/dL (ref 0.0–0.3)
TOTAL PROTEIN: 7 g/dL (ref 6.0–8.3)
Total Bilirubin: 0.6 mg/dL (ref 0.2–1.2)

## 2016-04-11 LAB — LIPID PANEL
CHOLESTEROL: 132 mg/dL (ref 0–200)
HDL: 49.8 mg/dL (ref >39.00)
LDL Cholesterol: 62 mg/dL (ref 0–99)
NonHDL: 82.33
TRIGLYCERIDES: 104 mg/dL (ref 0.0–149.0)
Total CHOL/HDL Ratio: 3
VLDL: 20.8 mg/dL (ref 0.0–40.0)

## 2016-04-11 LAB — HEMOGLOBIN A1C: Hgb A1c MFr Bld: 5.4 % (ref 4.6–6.5)

## 2016-04-11 LAB — TSH: TSH: 2.94 u[IU]/mL (ref 0.35–4.50)

## 2016-04-18 ENCOUNTER — Ambulatory Visit (INDEPENDENT_AMBULATORY_CARE_PROVIDER_SITE_OTHER): Payer: Medicare Other | Admitting: Family Medicine

## 2016-04-18 ENCOUNTER — Encounter: Payer: Self-pay | Admitting: Family Medicine

## 2016-04-18 VITALS — BP 124/60 | HR 52 | Temp 97.5°F | Ht 61.0 in | Wt 140.6 lb

## 2016-04-18 DIAGNOSIS — Z0000 Encounter for general adult medical examination without abnormal findings: Secondary | ICD-10-CM | POA: Diagnosis not present

## 2016-04-18 DIAGNOSIS — Z23 Encounter for immunization: Secondary | ICD-10-CM

## 2016-04-18 MED ORDER — VALSARTAN 320 MG PO TABS: 320.0000 mg | ORAL_TABLET | Freq: Every day | ORAL | 3 refills | Status: DC

## 2016-04-18 MED ORDER — METOPROLOL TARTRATE 50 MG PO TABS: ORAL_TABLET | ORAL | 3 refills | Status: DC

## 2016-04-18 MED ORDER — SIMVASTATIN 40 MG PO TABS: 40.0000 mg | ORAL_TABLET | Freq: Every day | ORAL | 3 refills | Status: DC

## 2016-04-18 MED ORDER — HYDROCHLOROTHIAZIDE 25 MG PO TABS: ORAL_TABLET | ORAL | 3 refills | Status: DC

## 2016-04-18 MED ORDER — ISRADIPINE 2.5 MG PO CAPS: 2.5000 mg | ORAL_CAPSULE | Freq: Every day | ORAL | 3 refills | Status: DC

## 2016-04-18 MED ORDER — POTASSIUM CHLORIDE CRYS ER 20 MEQ PO TBCR: 20.0000 meq | EXTENDED_RELEASE_TABLET | Freq: Every day | ORAL | 3 refills | Status: DC

## 2016-04-18 NOTE — Progress Notes (Signed)
   Subjective:    Patient ID: Shirley Wright, female    DOB: Jun 17, 1925, 82 y.o.   MRN: [id number]  HPI 82 yr old female with her daughter for a well exam. She is doing well and has no complaints today. She lost her husband during the past year and she she feels sad at times, but she feels like she has dealt with this in a positive way. She lives by herself but the family is in contact with her daily.    Review of Systems  Constitutional: Negative.   HENT: Negative.   Eyes: Negative.   Respiratory: Negative.   Cardiovascular: Negative.   Gastrointestinal: Negative.   Genitourinary: Negative for decreased urine volume, difficulty urinating, dyspareunia, dysuria, enuresis, flank pain, frequency, hematuria, pelvic pain and urgency.  Musculoskeletal: Negative.   Skin: Negative.   Neurological: Negative.   Psychiatric/Behavioral: Negative.        Objective:   Physical Exam  Constitutional: She is oriented to person, place, and time. She appears well-developed and well-nourished. No distress.  HENT:  Head: Normocephalic and atraumatic.  Right Ear: External ear normal.  Left Ear: External ear normal.  Nose: Nose normal.  Mouth/Throat: Oropharynx is clear and moist. No oropharyngeal exudate.  Eyes: Conjunctivae and EOM are normal. Pupils are equal, round, and reactive to light. No scleral icterus.  Neck: Normal range of motion. Neck supple. No JVD present. No thyromegaly present.  Cardiovascular: Normal rate, regular rhythm, normal heart sounds and intact distal pulses.  Exam reveals no gallop and no friction rub.   No murmur heard. Pulmonary/Chest: Effort normal and breath sounds normal. No respiratory distress. She has no wheezes. She has no rales. She exhibits no tenderness.  Abdominal: Soft. Bowel sounds are normal. She exhibits no distension and no mass. There is no tenderness. There is no rebound and no guarding.  Musculoskeletal: Normal range of motion. She exhibits no edema or  tenderness.  Lymphadenopathy:    She has no cervical adenopathy.  Neurological: She is alert and oriented to person, place, and time. She has normal reflexes. No cranial nerve deficit. She exhibits normal muscle tone. Coordination normal.  Skin: Skin is warm and dry. No rash noted. No erythema.  Psychiatric: She has a normal mood and affect. Her behavior is normal. Judgment and thought content normal.          Assessment & Plan:  Well exam. We discussed diet and exercise.  Laurey Morale, MD

## 2016-04-18 NOTE — Progress Notes (Signed)
Pre visit review using our clinic review tool, if applicable. No additional management support is needed unless otherwise documented below in the visit note. 

## 2016-04-26 ENCOUNTER — Ambulatory Visit: Payer: Medicare Other

## 2016-05-09 ENCOUNTER — Telehealth: Payer: Self-pay

## 2016-05-09 NOTE — Telephone Encounter (Signed)
[employer] states this is denies.  Pt needs to try one of 2 alternate drugs. [employer] is faxing over info now.

## 2016-05-09 NOTE — Telephone Encounter (Signed)
Received PA request from [facility] for Isradipine 2.5 mg capsules. PA submitted & is pending. Key: [id number]

## 2016-05-10 MED ORDER — AMLODIPINE BESYLATE 5 MG PO TABS: 5.0000 mg | ORAL_TABLET | Freq: Every day | ORAL | 3 refills | Status: DC

## 2016-05-10 NOTE — Telephone Encounter (Signed)
Stop Isradipine and switch to Amlodipine 5 mg daily. Call in one year supply

## 2016-05-10 NOTE — Telephone Encounter (Signed)
Alternatives are:  Amlodipine Nifedipine ER Verapamil

## 2016-05-10 NOTE — Telephone Encounter (Signed)
Rx sent 

## 2016-05-25 ENCOUNTER — Ambulatory Visit: Payer: Medicare Other | Admitting: Family Medicine

## 2016-07-13 ENCOUNTER — Encounter: Payer: Self-pay | Admitting: Family Medicine

## 2016-07-13 ENCOUNTER — Ambulatory Visit (INDEPENDENT_AMBULATORY_CARE_PROVIDER_SITE_OTHER): Payer: Medicare Other | Admitting: Family Medicine

## 2016-07-13 ENCOUNTER — Telehealth: Payer: Self-pay | Admitting: Family Medicine

## 2016-07-13 VITALS — BP 120/68 | HR 62 | Temp 97.6°F | Ht 61.0 in | Wt 142.9 lb

## 2016-07-13 DIAGNOSIS — R600 Localized edema: Secondary | ICD-10-CM

## 2016-07-13 MED ORDER — FUROSEMIDE 20 MG PO TABS: ORAL_TABLET | ORAL | 0 refills | Status: DC

## 2016-07-13 NOTE — Progress Notes (Signed)
HPI:  Acute visit for ankle edema: -bilat for 1-2 weeks -on ROC issues in th past, looks like was on lasix in the past for a number of years -no CP, SOB, DOE, erythema or pain in legs  ROS: See pertinent positives and negatives per HPI.  Past Medical History:  Diagnosis Date  . Adenomatous colon polyp    tubular  . Allergic rhinitis   . Diverticulosis   . GERD (gastroesophageal reflux disease)   . Hemorrhoids   . Hepatic cyst   . History of colonic polyps   . Hyperlipidemia   . Hypertension   . IBS (irritable bowel syndrome)   . Osteoarthritis     Past Surgical History:  Procedure Laterality Date  . benign breast biopsy    . COLONOSCOPY  06 50 11   Dr Earlean Shawl,  benign polyp, diverticulosis, no repeats recommended  . ganglion cyst removed     left thumba  . LUMBAR FUSION     L3-4 and L4-5 per Dr. Carloyn Manner 2002  . TONSILLECTOMY    . VAGINAL HYSTERECTOMY     with ovaries intact    Family History  Problem Relation Age of Onset  . Arthritis    . Diabetes    . Hypertension    . Lung cancer    . Heart disease      Social History   Social History  . Marital status: Married    Spouse name: N/A  . Number of children: N/A  . Years of education: N/A   Social History Main Topics  . Smoking status: Never Smoker  . Smokeless tobacco: Never Used  . Alcohol use No     Comment: once every 2-3 months  . Drug use: No  . Sexual activity: Not Asked   Other Topics Concern  . None   Social History Narrative  . None     Current Outpatient Prescriptions:  .  amLODipine (NORVASC) 5 MG tablet, Take 1 tablet (5 mg total) by mouth daily., Disp: 90 tablet, Rfl: 3 .  hydrochlorothiazide (HYDRODIURIL) 25 MG tablet, TAKE 1 TABLET (25 MG TOTAL) BY MOUTH DAILY., Disp: 90 tablet, Rfl: 3 .  HYDROcodone-acetaminophen (NORCO/VICODIN) 5-325 MG tablet, Take 1 tablet by mouth every 6 (six) hours as needed for moderate pain., Disp: 360 tablet, Rfl: 0 .  metoprolol (LOPRESSOR) 50 MG  tablet, TAKE 1 BY MOUTH DAILY, Disp: 90 tablet, Rfl: 3 .  polyethylene glycol powder (GLYCOLAX/MIRALAX) powder, Take 17 g by mouth daily., Disp: 255 g, Rfl: 3 .  potassium chloride SA (KLOR-CON M20) 20 MEQ tablet, Take 1 tablet (20 mEq total) by mouth daily., Disp: 90 tablet, Rfl: 3 .  simvastatin (ZOCOR) 40 MG tablet, Take 1 tablet (40 mg total) by mouth at bedtime., Disp: 90 tablet, Rfl: 3 .  valsartan (DIOVAN) 320 MG tablet, Take 1 tablet (320 mg total) by mouth daily., Disp: 90 tablet, Rfl: 3 .  furosemide (LASIX) 20 MG tablet, Take 20mg  once daily for 3 days as needed for swelling., Disp: 20 tablet, Rfl: 0  EXAM:  Vitals:   07/13/16 1530  BP: 120/68  Pulse: 62  Temp: 97.6 F (36.4 C)    Body mass index is 27 kg/m.  GENERAL: vitals reviewed and listed above, alert, oriented, appears well hydrated and in no acute distress  HEENT: atraumatic, conjunttiva clear, no obvious abnormalities on inspection of external nose and ears  NECK: no obvious masses on inspection, no JVD  LUNGS: clear to auscultation bilaterally, no  wheezes, rales or rhonchi, good air movement  CV: HRRR, 1+ edema bilat ankles and lower 1/3 lowe legs, no redness, rash or TTP, no DV TTP  MS: moves all extremities without noticeable abnormality  PSYCH: pleasant and cooperative, no obvious depression or anxiety  ASSESSMENT AND PLAN:  Discussed the following assessment and plan:  Lower extremity edema  -on roc looks like has had this complaint in the past and was on lasix per med list for several years -likely venous insufficiency -opted to tx with elevation, compression and if needed lasix instead of hctz for 3-7 days prn - from talking with the daughter lasix may have been contributing urinary frequency? -Patient advised to return or notify a doctor immediately if symptoms worsen or persist or new concerns arise.  Patient Instructions  BEFORE YOU LEAVE: -follow up: 1 month with PCP  Compression sock to  knees  Elevated legs for 30 minutes 1-2 times daily  Can use Lasix 20mg  once daily for 3 days (instead of the hydrochlorthiazide)  Seek care sooner if worsening or new concerns.    Colin Benton R., DO

## 2016-07-13 NOTE — Progress Notes (Signed)
Pre visit review using our clinic review tool, if applicable. No additional management support is needed unless otherwise documented below in the visit note. 

## 2016-07-13 NOTE — Patient Instructions (Signed)
BEFORE YOU LEAVE: -follow up: 1 month with PCP  Compression sock to knees  Elevated legs for 30 minutes 1-2 times daily  Can use Lasix 20mg  once daily for 3 days (instead of the hydrochlorthiazide)  Seek care sooner if worsening or new concerns.

## 2016-07-13 NOTE — Addendum Note (Signed)
Addended by: Agnes Lawrence on: 07/13/2016 04:11 PM   Modules accepted: Orders

## 2016-07-13 NOTE — Telephone Encounter (Signed)
Noted  

## 2016-07-13 NOTE — Telephone Encounter (Signed)
[facility]  Patient Name: Shirley Wright  DOB: [birth year]    Initial Comment caller states her mother has leg swelling   Nurse Assessment  Nurse: Harlow Mares, RN, Suanne Marker Date/Time (Eastern Time): 07/13/2016 1:02:34 PM  Confirm and document reason for call. If symptomatic, describe symptoms. ---caller states her mother has leg swelling, both lower legs from the knees to the ankles.  Does the patient have any new or worsening symptoms? ---Yes  Will a triage be completed? ---Yes  Related visit to physician within the last 2 weeks? ---No  Does the PT have any chronic conditions? (i.e. diabetes, asthma, etc.) ---Yes  List chronic conditions. ---HTN high chol,  Is this a behavioral health or substance abuse call? ---No     Guidelines    Guideline Title Affirmed Question Affirmed Notes  Leg Swelling and Edema [1] MODERATE leg swelling (e.g., swelling extends up to knees) AND [2] new onset or worsening    Final Disposition User   See Physician within 24 Hours Harlow Mares, RN, Suanne Marker    Comments  Appt scheduled for today at the [place] location with Dr. Maudie Mercury at 3:30p.   Referrals  REFERRED TO PCP OFFICE   Disagree/Comply: Comply

## 2016-07-16 ENCOUNTER — Ambulatory Visit: Payer: Medicare Other | Admitting: Family Medicine

## 2016-08-07 ENCOUNTER — Ambulatory Visit (INDEPENDENT_AMBULATORY_CARE_PROVIDER_SITE_OTHER): Payer: Medicare Other | Admitting: Family Medicine

## 2016-08-07 ENCOUNTER — Encounter: Payer: Self-pay | Admitting: Family Medicine

## 2016-08-07 VITALS — BP 102/58 | HR 64 | Temp 97.5°F | Ht 61.0 in | Wt 139.4 lb

## 2016-08-07 DIAGNOSIS — H6122 Impacted cerumen, left ear: Secondary | ICD-10-CM | POA: Diagnosis not present

## 2016-08-07 DIAGNOSIS — I10 Essential (primary) hypertension: Secondary | ICD-10-CM | POA: Diagnosis not present

## 2016-08-07 DIAGNOSIS — B029 Zoster without complications: Secondary | ICD-10-CM

## 2016-08-07 MED ORDER — VALACYCLOVIR HCL 1 G PO TABS: 1000.0000 mg | ORAL_TABLET | Freq: Three times a day (TID) | ORAL | 0 refills | Status: DC

## 2016-08-07 NOTE — Progress Notes (Signed)
HPI:  Acute visit for Rash: -X2-3 days. Painful.  -L back and breast only in dermatomal pattern. -no fevers, malaise, rash elsewhere  Discomfort L ear: -mild -hx mild allergies  HTN: -ran out of hctz some time ago and wonders if needs it -no swelling issues since prior visit   ROS: See pertinent positives and negatives per HPI.  Past Medical History:  Diagnosis Date  . Adenomatous colon polyp    tubular  . Allergic rhinitis   . Diverticulosis   . GERD (gastroesophageal reflux disease)   . Hemorrhoids   . Hepatic cyst   . History of colonic polyps   . Hyperlipidemia   . Hypertension   . IBS (irritable bowel syndrome)   . Osteoarthritis     Past Surgical History:  Procedure Laterality Date  . benign breast biopsy    . COLONOSCOPY  06 12 11   Dr Earlean Shawl,  benign polyp, diverticulosis, no repeats recommended  . ganglion cyst removed     left thumba  . LUMBAR FUSION     L3-4 and L4-5 per Dr. Carloyn Manner 2002  . TONSILLECTOMY    . VAGINAL HYSTERECTOMY     with ovaries intact    Family History  Problem Relation Age of Onset  . Arthritis    . Diabetes    . Hypertension    . Lung cancer    . Heart disease      Social History   Social History  . Marital status: Married    Spouse name: N/A  . Number of children: N/A  . Years of education: N/A   Social History Main Topics  . Smoking status: Never Smoker  . Smokeless tobacco: Never Used  . Alcohol use No     Comment: once every 2-3 months  . Drug use: No  . Sexual activity: Not Asked   Other Topics Concern  . None   Social History Narrative  . None     Current Outpatient Prescriptions:  .  amLODipine (NORVASC) 5 MG tablet, Take 1 tablet (5 mg total) by mouth daily., Disp: 90 tablet, Rfl: 3 .  furosemide (LASIX) 20 MG tablet, Take 20mg  once daily for 3 days as needed for swelling., Disp: 20 tablet, Rfl: 0 .  hydrochlorothiazide (HYDRODIURIL) 25 MG tablet, TAKE 1 TABLET (25 MG TOTAL) BY MOUTH DAILY.,  Disp: 90 tablet, Rfl: 3 .  HYDROcodone-acetaminophen (NORCO/VICODIN) 5-325 MG tablet, Take 1 tablet by mouth every 6 (six) hours as needed for moderate pain., Disp: 360 tablet, Rfl: 0 .  metoprolol (LOPRESSOR) 50 MG tablet, TAKE 1 BY MOUTH DAILY, Disp: 90 tablet, Rfl: 3 .  polyethylene glycol powder (GLYCOLAX/MIRALAX) powder, Take 17 g by mouth daily., Disp: 255 g, Rfl: 3 .  potassium chloride SA (KLOR-CON M20) 20 MEQ tablet, Take 1 tablet (20 mEq total) by mouth daily., Disp: 90 tablet, Rfl: 3 .  simvastatin (ZOCOR) 40 MG tablet, Take 1 tablet (40 mg total) by mouth at bedtime., Disp: 90 tablet, Rfl: 3 .  valsartan (DIOVAN) 320 MG tablet, Take 1 tablet (320 mg total) by mouth daily., Disp: 90 tablet, Rfl: 3 .  valACYclovir (VALTREX) 1000 MG tablet, Take 1 tablet (1,000 mg total) by mouth 3 (three) times daily., Disp: 21 tablet, Rfl: 0  EXAM:  Vitals:   08/07/16 1426  BP: (!) 102/58  Pulse: 64  Temp: 97.5 F (36.4 C)    Body mass index is 26.34 kg/m.  GENERAL: vitals reviewed and listed above, alert, oriented, appears  well hydrated and in no acute distress  HEENT: atraumatic, conjunttiva clear, no obvious abnormalities on inspection of external nose and ears, cerumen impaction L ear canal - normal appearance canal and TM after removal wax.  NECK: no obvious masses on inspection  LUNGS: clear to auscultation bilaterally, no wheezes, rales or rhonchi, good air movement  CV: HRRR, no peripheral edema  SKIN: papulovesicular rash on L upper back and breast in dermatomal pattern  MS: moves all extremities without noticeable abnormality  PSYCH: pleasant and cooperative, no obvious depression or anxiety  ASSESSMENT AND PLAN:  Discussed the following assessment and plan:  Herpes zoster without complication -valtrex -has pain med at home -discussed course, potential complications, side effect medication, return precuations  Essential hypertension -bp lower end today, hold hctz  for now and follow up with PCP in 1 month  Impacted cerumen of left ear -cerumen removed with soft currette - pt tol well -normal exam after removal wax  -Patient advised to return or notify a doctor immediately if symptoms worsen or persist or new concerns arise.  Patient Instructions  BEFORE YOU LEAVE: -follow up: 1 month with PCP  Start the valtrex.  Hold of on the hydrochlorothiazide for now.   Shingles Shingles is an infection that causes a painful skin rash and fluid-filled blisters. Shingles is caused by the same virus that causes chickenpox. Shingles only develops in people who:  Have had chickenpox.  Have gotten the chickenpox vaccine. (This is rare.) The first symptoms of shingles may be itching, tingling, or pain in an area on your skin. A rash will follow in a few days or weeks. The rash is usually on one side of the body in a bandlike or beltlike pattern. Over time, the rash turns into fluid-filled blisters that break open, scab over, and dry up. Medicines may:  Help you manage pain.  Help you recover more quickly.  Help to prevent long-term problems. Follow these instructions at home: Medicines   Take medicines only as told by your doctor.  Apply an anti-itch or numbing cream to the affected area as told by your doctor. Blister and Rash Care   Take a cool bath or put cool compresses on the area of the rash or blisters as told by your doctor. This may help with pain and itching.  Keep your rash covered with a loose bandage (dressing). Wear loose-fitting clothing.  Keep your rash and blisters clean with mild soap and cool water or as told by your doctor.  Check your rash every day for signs of infection. These include redness, swelling, and pain that lasts or gets worse.  Do not pick your blisters.  Do not scratch your rash. General instructions   Rest as told by your doctor.  Keep all follow-up visits as told by your doctor. This is  important.  Until your blisters scab over, your infection can cause chickenpox in people who have never had it or been vaccinated against it. To prevent this from happening, avoid touching other people or being around other people, especially:  Babies.  Pregnant women.  Children who have eczema.  Elderly people who have transplants.  People who have chronic illnesses, such as leukemia or AIDS. Contact a doctor if:  Your pain does not get better with medicine.  Your pain does not get better after the rash heals.  Your rash looks infected. Signs of infection include:  Redness.  Swelling.  Pain that lasts or gets worse. Get help right away  if:  The rash is on your face or nose.  You have pain in your face, pain around your eye area, or loss of feeling on one side of your face.  You have ear pain or you have ringing in your ear.  You have loss of taste.  Your condition gets worse. This information is not intended to replace advice given to you by your health care provider. Make sure you discuss any questions you have with your health care provider. Document Released: 10/24/2007 Document Revised: 01/01/2016 Document Reviewed: 02/16/2014 Elsevier Interactive Patient Education  2017 [place] DO

## 2016-08-07 NOTE — Progress Notes (Signed)
Pre visit review using our clinic review tool, if applicable. No additional management support is needed unless otherwise documented below in the visit note. 

## 2016-08-07 NOTE — Patient Instructions (Signed)
BEFORE YOU LEAVE: -follow up: 1 month with PCP  Start the valtrex.  Hold of on the hydrochlorothiazide for now.   Shingles Shingles is an infection that causes a painful skin rash and fluid-filled blisters. Shingles is caused by the same virus that causes chickenpox. Shingles only develops in people who:  Have had chickenpox.  Have gotten the chickenpox vaccine. (This is rare.) The first symptoms of shingles may be itching, tingling, or pain in an area on your skin. A rash will follow in a few days or weeks. The rash is usually on one side of the body in a bandlike or beltlike pattern. Over time, the rash turns into fluid-filled blisters that break open, scab over, and dry up. Medicines may:  Help you manage pain.  Help you recover more quickly.  Help to prevent long-term problems. Follow these instructions at home: Medicines   Take medicines only as told by your doctor.  Apply an anti-itch or numbing cream to the affected area as told by your doctor. Blister and Rash Care   Take a cool bath or put cool compresses on the area of the rash or blisters as told by your doctor. This may help with pain and itching.  Keep your rash covered with a loose bandage (dressing). Wear loose-fitting clothing.  Keep your rash and blisters clean with mild soap and cool water or as told by your doctor.  Check your rash every day for signs of infection. These include redness, swelling, and pain that lasts or gets worse.  Do not pick your blisters.  Do not scratch your rash. General instructions   Rest as told by your doctor.  Keep all follow-up visits as told by your doctor. This is important.  Until your blisters scab over, your infection can cause chickenpox in people who have never had it or been vaccinated against it. To prevent this from happening, avoid touching other people or being around other people, especially:  Babies.  Pregnant women.  Children who have  eczema.  Elderly people who have transplants.  People who have chronic illnesses, such as leukemia or AIDS. Contact a doctor if:  Your pain does not get better with medicine.  Your pain does not get better after the rash heals.  Your rash looks infected. Signs of infection include:  Redness.  Swelling.  Pain that lasts or gets worse. Get help right away if:  The rash is on your face or nose.  You have pain in your face, pain around your eye area, or loss of feeling on one side of your face.  You have ear pain or you have ringing in your ear.  You have loss of taste.  Your condition gets worse. This information is not intended to replace advice given to you by your health care provider. Make sure you discuss any questions you have with your health care provider. Document Released: 10/24/2007 Document Revised: 01/01/2016 Document Reviewed: 02/16/2014 Elsevier Interactive Patient Education  2017 [employer].

## 2016-08-08 ENCOUNTER — Ambulatory Visit: Payer: Medicare Other | Admitting: Family Medicine

## 2016-08-10 ENCOUNTER — Ambulatory Visit: Payer: Medicare Other | Admitting: Family Medicine

## 2016-08-15 ENCOUNTER — Telehealth: Payer: Self-pay | Admitting: Family Medicine

## 2016-08-15 MED ORDER — GABAPENTIN 100 MG PO CAPS: 100.0000 mg | ORAL_CAPSULE | Freq: Three times a day (TID) | ORAL | 0 refills | Status: DC

## 2016-08-15 MED ORDER — GABAPENTIN 100 MG PO CAPS
100.0000 mg | ORAL_CAPSULE | Freq: Three times a day (TID) | ORAL | 0 refills | Status: DC
Start: 2016-08-15 — End: 2016-08-15

## 2016-08-15 NOTE — Telephone Encounter (Signed)
Was recently seen for shingles and was given Valtrex. She still as a lot of pain, so we will give her Gabapentin 100 mg to take TID prn

## 2016-09-05 ENCOUNTER — Ambulatory Visit: Payer: Medicare Other | Admitting: Family Medicine

## 2016-11-12 ENCOUNTER — Telehealth: Payer: Self-pay | Admitting: Family Medicine

## 2016-11-12 MED ORDER — HYDROCODONE-ACETAMINOPHEN 5-325 MG PO TABS
1.0000 | ORAL_TABLET | Freq: Four times a day (QID) | ORAL | 0 refills | Status: DC | PRN
Start: 2016-11-12 — End: 2017-01-16

## 2016-11-12 NOTE — Telephone Encounter (Signed)
In Dr  Sarajane Jews Since can fill 14 days worth of medicine. Dispense 56 further refills by Dr. Sarajane Jews

## 2016-11-12 NOTE — Telephone Encounter (Signed)
Script is ready for pick up here at front office and I spoke with pt.  

## 2016-11-12 NOTE — Telephone Encounter (Signed)
Pt need new Rx for hydrocodone.  Pt is aware that is will be ready within 3 business days that someone will call when ready for pick-up.

## 2016-12-28 ENCOUNTER — Telehealth: Payer: Self-pay | Admitting: Family Medicine

## 2016-12-28 MED ORDER — LOSARTAN POTASSIUM 100 MG PO TABS: 100.0000 mg | ORAL_TABLET | Freq: Every day | ORAL | 3 refills | Status: DC

## 2016-12-28 NOTE — Telephone Encounter (Signed)
Change from Valsartan to Losartan 100 mg to take daily, call in #90 with 3 rf

## 2016-12-28 NOTE — Telephone Encounter (Signed)
Request from [facility] to change Valsartan due to recall and a 90 day supply .

## 2016-12-28 NOTE — Telephone Encounter (Signed)
I spoke with pt, sent new script e-scribe to [facility] and removed Valsartan from current medication list.

## 2017-01-03 ENCOUNTER — Ambulatory Visit (INDEPENDENT_AMBULATORY_CARE_PROVIDER_SITE_OTHER): Payer: Medicare Other

## 2017-01-03 ENCOUNTER — Telehealth: Payer: Self-pay

## 2017-01-03 VITALS — BP 100/50 | HR 52 | Ht 61.0 in | Wt 137.0 lb

## 2017-01-03 DIAGNOSIS — Z0000 Encounter for general adult medical examination without abnormal findings: Secondary | ICD-10-CM | POA: Diagnosis not present

## 2017-01-03 NOTE — Progress Notes (Signed)
Subjective:   Shirley Wright is a 83 y.o. female who presents for Medicare Annual (Subsequent) preventive examination.  The Patient was informed that the wellness visit is to identify future health risk and educate and initiate measures that can reduce risk for increased disease through the lifespan.    Annual Wellness Assessment  March - current resolution of shingles; Under her arm; back and shoulder Resolved this week.  Preventive Screening -Counseling & Management  Medicare Annual Preventive Care Visit - Subsequent Last OV 3/20/218 acute  Mammogram 01/2016 Dexa - no report-discussed repeat DEXA that the patient opted out. Her appetite is good she eats well and her mobility is very good for 83 years old. States she walks frequently in her home and likes to go out to lunch during the day.  Pneumonia vaccines due. States she is allergic to pneumonia vaccines as well as tetanus. Agreed to taking both of these out of Epic at this time and she is no longer a candidate.    Falls - fell going to the mailbox  No dizziness; no injury    08/07/16 07/13/16 04/18/16  BP 102/58 120/68 124/60   Does enjoy hot dogs on occasion. Will ask dr. Sarajane Jews about med changes   At times, has developed a red rash on both feet; from lower calf down Where the sock fit. Took 2 weeks to go away. This was not present today. She is welcome to come in and if this happens again to be evaluated.    Describes Health as poor, fair, good or great? Good   VS reviewed;   Diet  Breakfast; cereal; Rasin bran and peaches and bb May have doughnut Lunch big meal; goes out; children bring her food  Supper; bojangles    BMI 25   Exercise Walks to the mailbox; 5 times a week If she is sitting she will up and down multiple times. Her get up and go from the chair to the wall is 15 sec;   Pain - no pain    Cardiac Risk Factors Addressed Hyperlipidemia - chol/hdl 3; HDl 49; LDL 62; trig 104 Diabetes A1c  5.4     Advanced Directives completed   Patient Care Team: Laurey Morale, MD as PCP - General Assessed for additional providers  Immunization History  Administered Date(s) Administered  . Influenza Split 02/22/2011, 03/04/2012  . Influenza Whole 02/16/2008, 01/25/2010  . Influenza, High Dose Seasonal PF 04/18/2016  . Influenza,inj,Quad PF,36+ Mos 03/13/2013, 02/24/2014, 02/24/2015   Required Immunizations needed today  Screening test up to date or reviewed for plan of completion Health Maintenance Due  Topic Date Due  . INFLUENZA VACCINE  12/19/2016   Not a candidate for tetanus or pneumonia due to allergic reactions Declined dexa Will take her flu vaccine when available   Cardiac Risk Factors include: advanced age (>41men, >24 women);dyslipidemia;hypertension     Objective:     Vitals: BP (!) 100/50   Pulse (!) 52   Ht 5\' 1"  (1.549 m)   Wt 137 lb (62.1 kg)   SpO2 95%   BMI 25.89 kg/m   Body mass index is 25.89 kg/m.   Tobacco History  Smoking Status  . Never Smoker  Smokeless Tobacco  . Never Used     Counseling given: Yes   Past Medical History:  Diagnosis Date  . Adenomatous colon polyp    tubular  . Allergic rhinitis   . Diverticulosis   . GERD (gastroesophageal reflux disease)   .  Hemorrhoids   . Hepatic cyst   . History of colonic polyps   . Hyperlipidemia   . Hypertension   . IBS (irritable bowel syndrome)   . Osteoarthritis    Past Surgical History:  Procedure Laterality Date  . benign breast biopsy    . COLONOSCOPY  [phone]   Dr Earlean Shawl,  benign polyp, diverticulosis, no repeats recommended  . ganglion cyst removed     left thumba  . LUMBAR FUSION     L3-4 and L4-5 per Dr. Carloyn Manner 2002  . TONSILLECTOMY    . VAGINAL HYSTERECTOMY     with ovaries intact   Family History  Problem Relation Age of Onset  . Arthritis Unknown   . Diabetes Unknown   . Hypertension Unknown   . Lung cancer Unknown   . Heart disease Unknown     History  Sexual Activity  . Sexual activity: Not on file    Outpatient Encounter Prescriptions as of 01/03/2017  Medication Sig  . amLODipine (NORVASC) 5 MG tablet Take 1 tablet (5 mg total) by mouth daily.  . furosemide (LASIX) 20 MG tablet Take 20mg  once daily for 3 days as needed for swelling.  Marland Kitchen HYDROcodone-acetaminophen (NORCO/VICODIN) 5-325 MG tablet Take 1 tablet by mouth every 6 (six) hours as needed for moderate pain.  Marland Kitchen losartan (COZAAR) 100 MG tablet Take 1 tablet (100 mg total) by mouth daily.  . metoprolol (LOPRESSOR) 50 MG tablet TAKE 1 BY MOUTH DAILY  . polyethylene glycol powder (GLYCOLAX/MIRALAX) powder Take 17 g by mouth daily.  . simvastatin (ZOCOR) 40 MG tablet Take 1 tablet (40 mg total) by mouth at bedtime.  . gabapentin (NEURONTIN) 100 MG capsule Take 1 capsule (100 mg total) by mouth 3 (three) times daily. (Patient not taking: Reported on 01/03/2017)  . hydrochlorothiazide (HYDRODIURIL) 25 MG tablet TAKE 1 TABLET (25 MG TOTAL) BY MOUTH DAILY. (Patient not taking: Reported on 01/03/2017)  . potassium chloride SA (KLOR-CON M20) 20 MEQ tablet Take 1 tablet (20 mEq total) by mouth daily. (Patient not taking: Reported on 01/03/2017)  . valACYclovir (VALTREX) 1000 MG tablet Take 1 tablet (1,000 mg total) by mouth 3 (three) times daily. (Patient not taking: Reported on 01/03/2017)   No facility-administered encounter medications on file as of 01/03/2017.     Activities of Daily Living In your present state of health, do you have any difficulty performing the following activities: 01/03/2017  Hearing? N  Vision? N  Difficulty concentrating or making decisions? N  Walking or climbing stairs? N  Dressing or bathing? N  Comment takes a shower; no grab bars   Doing errands, shopping? N  Preparing Food and eating ? N  Using the Toilet? N  In the past six months, have you accidently leaked urine? Y  Comment she does have leaking   Do you have problems with loss of bowel  control? N  Managing your Medications? N  Managing your Finances? N  Housekeeping or managing your Housekeeping? N  Some recent data might be hidden    Patient Care Team: Laurey Morale, MD as PCP - General    Assessment:     Exercise Activities and Dietary recommendations Current Exercise Habits: Home exercise routine, Intensity: Mild  Goals    . patient          Continue ongoing plan as is       Fall Risk Fall Risk  01/03/2017 04/12/2014  Falls in the past year? Yes No  Number falls in past yr: 1 -  Injury with Fall? No -  Follow up Education provided -   Depression Screen PHQ 2/9 Scores 01/03/2017 04/12/2014  PHQ - 2 Score 0 0     Cognitive Function MMSE - Mini Mental State Exam 01/03/2017  Not completed: (No Data)  Very sharp and focused;  No issues presented with daily living. Dtr present today      Immunization History  Administered Date(s) Administered  . Influenza Split 02/22/2011, 03/04/2012  . Influenza Whole 02/16/2008, 01/25/2010  . Influenza, High Dose Seasonal PF 04/18/2016  . Influenza,inj,Quad PF,36+ Mos 03/13/2013, 02/24/2014, 02/24/2015   Screening Tests Health Maintenance  Topic Date Due  . INFLUENZA VACCINE  12/19/2016  . DEXA SCAN  01/18/2029 (Originally 08/27/1990)      Plan:     PCP Notes   Health Maintenance Pneumonia vaccinations and tetanus were taken out of epic due to her not being a candidate secondary to allergic reaction. Discussed DEXA scan with the patient but she declined at this time. Will take her flu vaccine this fall.  Abnormal Screens  Has mild has mild hearing loss. Discussed having a hearing screen and also given information regarding a free hearing aid from the division of hard of hearing. Given resources for [place] to assist her in pitting handrails are bars up in the shower. Also discussed other social resources in the community.   Referrals  No referrals at this time  Patient concerns; Patient  expressed no particular concerns. She is still living independently but her son lives nearby. Still maintaining independent living and is still driving.  Nurse Concerns; The daughter mentioned the patient's blood pressure has been running around 100/60. The daughter states the patient has not taken her hydrochlorothiazide and since March. The patient states she is taking her Lasix 20 mg only one time per day in the morning. The daughter asked if her blood pressure medicine and could be slightly reduced. Patient denies any dizziness and balance issues and had one fall at the mailbox with no incident. Will send a basket note to Dr. Sarajane Jews to review and recommendation. The patient is to see him in November.  The patient's blood pressure today was 102/58. Her get up and go was approximately 15 seconds with no issue getting out of a chair or sitting  back down in the chair.  Next PCP apt We will schedule appointment for November for annual labs.     I have personally reviewed and noted the following in the patient's chart:   . Medical and social history . Use of alcohol, tobacco or illicit drugs  . Current medications and supplements . Functional ability and status . Nutritional status . Physical activity . Advanced directives . List of other physicians . Hospitalizations, surgeries, and ER visits in previous 12 months . Vitals . Screenings to include cognitive, depression, and falls . Referrals and appointments  In addition, I have reviewed and discussed with patient certain preventive protocols, quality metrics, and best practice recommendations. A written personalized care plan for preventive services as well as general preventive health recommendations were provided to patient.     Wynetta Fines, RN  01/03/2017

## 2017-01-03 NOTE — Patient Instructions (Addendum)
Shirley Wright , Thank you for taking time to come for your Medicare Wellness Visit. I appreciate your ongoing commitment to your health goals. Please review the following plan we discussed and let me know if I can assist you in the future.    Hearing screen for free Deaf & Hard of Hearing Division Services - can assist with hearing aid x 1  No reviews  [facility]  [address]  [phone]   [place] [facility]; [phone] ask about community housing solutions to assist with grab bars  Sr. Line; 7733024019 Get resource to get information on any and all community programs for Seniors  [place]: 629-763-8234 Community Health Response Program -035-465-6812 Public Health Dept; Need to be a skilled visit but can assist with bathing as well; 705-032-5134  Adult center for Enrichment;  Call Senior Line; [phone]  Adult day services include Adult Day Care, [place], [facility] [place], Education and [place] support group and information regarding [place] is at the; [employer] Address: [address], [place], [place] [address]  Phone: [phone]     Deaf & Hard of [place] - can assist with hearing aid x 1  No reviews  [place]  [address]  [phone]  Keep in mind the flu shot is an inactivated vaccine and takes at least 2 weeks to build immunity. The flu virus can be dormant for 4 days prior to symptoms Taking the flu shot at the beginning of the season can reduce the risk for the entire community.       These are the goals we discussed: Goals    . patient          Continue ongoing plan as is        This is a list of the screening recommended for you and due dates:  Health Maintenance  Topic Date Due  . Tetanus Vaccine  08/28/1944  . DEXA scan (bone density measurement)  08/27/1992   . Pneumonia vaccines (1 of 2 - PCV13) 08/27/1992  . Flu Shot  12/19/2016     Tetanus and pneumonia; declined due to prior allergic reaction; agreed to removing these as she is no longer a candidate Declined dexa   Fall Prevention in the Home Falls can cause injuries. They can happen to people of all ages. There are many things you can do to make your home safe and to help prevent falls. What can I do on the outside of my home?  Regularly fix the edges of walkways and driveways and fix any cracks.  Remove anything that might make you trip as you walk through a door, such as a raised step or threshold.  Trim any bushes or trees on the path to your home.  Use bright outdoor lighting.  Clear any walking paths of anything that might make someone trip, such as rocks or tools.  Regularly check to see if handrails are loose or broken. Make sure that both sides of any steps have handrails.  Any raised decks and porches should have guardrails on the edges.  Have any leaves, snow, or ice cleared regularly.  Use sand or salt on walking paths during winter.  Clean up any spills in your garage right away. This includes oil or grease spills. What can I do in the bathroom?  Use night lights.  Install  grab bars by the toilet and in the tub and shower. Do not use towel bars as grab bars.  Use non-skid mats or decals in the tub or shower.  If you need to sit down in the shower, use a plastic, non-slip stool.  Keep the floor dry. Clean up any water that spills on the floor as soon as it happens.  Remove soap buildup in the tub or shower regularly.  Attach bath mats securely with double-sided non-slip rug tape.  Do not have throw rugs and other things on the floor that can make you trip. What can I do in the bedroom?  Use night lights.  Make sure that you have a light by your bed that is easy to reach.  Do not use any sheets or blankets that are too big for your bed. They should  not hang down onto the floor.  Have a firm chair that has side arms. You can use this for support while you get dressed.  Do not have throw rugs and other things on the floor that can make you trip. What can I do in the kitchen?  Clean up any spills right away.  Avoid walking on wet floors.  Keep items that you use a lot in easy-to-reach places.  If you need to reach something above you, use a strong step stool that has a grab bar.  Keep electrical cords out of the way.  Do not use floor polish or wax that makes floors slippery. If you must use wax, use non-skid floor wax.  Do not have throw rugs and other things on the floor that can make you trip. What can I do with my stairs?  Do not leave any items on the stairs.  Make sure that there are handrails on both sides of the stairs and use them. Fix handrails that are broken or loose. Make sure that handrails are as long as the stairways.  Check any carpeting to make sure that it is firmly attached to the stairs. Fix any carpet that is loose or worn.  Avoid having throw rugs at the top or bottom of the stairs. If you do have throw rugs, attach them to the floor with carpet tape.  Make sure that you have a light switch at the top of the stairs and the bottom of the stairs. If you do not have them, ask someone to add them for you. What else can I do to help prevent falls?  Wear shoes that: ? Do not have high heels. ? Have rubber bottoms. ? Are comfortable and fit you well. ? Are closed at the toe. Do not wear sandals.  If you use a stepladder: ? Make sure that it is fully opened. Do not climb a closed stepladder. ? Make sure that both sides of the stepladder are locked into place. ? Ask someone to hold it for you, if possible.  Clearly mark and make sure that you can see: ? Any grab bars or handrails. ? First and last steps. ? Where the edge of each step is.  Use tools that help you move around (mobility aids) if they are  needed. These include: ? Canes. ? Walkers. ? Scooters. ? Crutches.  Turn on the lights when you go into a dark area. Replace any light bulbs as soon as they burn out.  Set up your furniture so you have a clear path. Avoid moving your furniture around.  If any of your floors are uneven,  fix them.  If there are any pets around you, be aware of where they are.  Review your medicines with your doctor. Some medicines can make you feel dizzy. This can increase your chance of falling. Ask your doctor what other things that you can do to help prevent falls. This information is not intended to replace advice given to you by your health care provider. Make sure you discuss any questions you have with your health care provider. Document Released: 03/03/2009 Document Revised: 10/13/2015 Document Reviewed: 06/11/2014 Elsevier Interactive Patient Education  2018 [place] Maintenance, Female Adopting a healthy lifestyle and getting preventive care can go a long way to promote health and wellness. Talk with your health care provider about what schedule of regular examinations is right for you. This is a good chance for you to check in with your provider about disease prevention and staying healthy. In between checkups, there are plenty of things you can do on your own. Experts have done a lot of research about which lifestyle changes and preventive measures are most likely to keep you healthy. Ask your health care provider for more information. Weight and diet Eat a healthy diet  Be sure to include plenty of vegetables, fruits, low-fat dairy products, and lean protein.  Do not eat a lot of foods high in solid fats, added sugars, or salt.  Get regular exercise. This is one of the most important things you can do for your health. ? Most adults should exercise for at least 150 minutes each week. The exercise should increase your heart rate and make you sweat (moderate-intensity  exercise). ? Most adults should also do strengthening exercises at least twice a week. This is in addition to the moderate-intensity exercise.  Maintain a healthy weight  Body mass index (BMI) is a measurement that can be used to identify possible weight problems. It estimates body fat based on height and weight. Your health care provider can help determine your BMI and help you achieve or maintain a healthy weight.  For females 94 years of age and older: ? A BMI below 18.5 is considered underweight. ? A BMI of 18.5 to 24.9 is normal. ? A BMI of 25 to 29.9 is considered overweight. ? A BMI of 30 and above is considered obese.  Watch levels of cholesterol and blood lipids  You should start having your blood tested for lipids and cholesterol at 81 years of age, then have this test every 5 years.  You may need to have your cholesterol levels checked more often if: ? Your lipid or cholesterol levels are high. ? You are older than 82 years of age. ? You are at high risk for heart disease.  Cancer screening Lung Cancer  Lung cancer screening is recommended for adults 74-64 years old who are at high risk for lung cancer because of a history of smoking.  A yearly low-dose CT scan of the lungs is recommended for people who: ? Currently smoke. ? Have quit within the past 15 years. ? Have at least a 30-pack-year history of smoking. A pack year is smoking an average of one pack of cigarettes a day for 1 year.  Yearly screening should continue until it has been 15 years since you quit.  Yearly screening should stop if you develop a health problem that would prevent you from having lung cancer treatment.  Breast Cancer  Practice breast self-awareness. This means understanding how your breasts normally appear and feel.  It  also means doing regular breast self-exams. Let your health care provider know about any changes, no matter how small.  If you are in your 20s or 30s, you should have a  clinical breast exam (CBE) by a health care provider every 1-3 years as part of a regular health exam.  If you are 48 or older, have a CBE every year. Also consider having a breast X-ray (mammogram) every year.  If you have a family history of breast cancer, talk to your health care provider about genetic screening.  If you are at high risk for breast cancer, talk to your health care provider about having an MRI and a mammogram every year.  Breast cancer gene (BRCA) assessment is recommended for women who have family members with BRCA-related cancers. BRCA-related cancers include: ? Breast. ? Ovarian. ? Tubal. ? Peritoneal cancers.  Results of the assessment will determine the need for genetic counseling and BRCA1 and BRCA2 testing.  Cervical Cancer Your health care provider may recommend that you be screened regularly for cancer of the pelvic organs (ovaries, uterus, and vagina). This screening involves a pelvic examination, including checking for microscopic changes to the surface of your cervix (Pap test). You may be encouraged to have this screening done every 3 years, beginning at age 63.  For women ages 53-65, health care providers may recommend pelvic exams and Pap testing every 3 years, or they may recommend the Pap and pelvic exam, combined with testing for human papilloma virus (HPV), every 5 years. Some types of HPV increase your risk of cervical cancer. Testing for HPV may also be done on women of any age with unclear Pap test results.  Other health care providers may not recommend any screening for nonpregnant women who are considered low risk for pelvic cancer and who do not have symptoms. Ask your health care provider if a screening pelvic exam is right for you.  If you have had past treatment for cervical cancer or a condition that could lead to cancer, you need Pap tests and screening for cancer for at least 20 years after your treatment. If Pap tests have been discontinued,  your risk factors (such as having a new sexual partner) need to be reassessed to determine if screening should resume. Some women have medical problems that increase the chance of getting cervical cancer. In these cases, your health care provider may recommend more frequent screening and Pap tests.  Colorectal Cancer  This type of cancer can be detected and often prevented.  Routine colorectal cancer screening usually begins at 81 years of age and continues through 81 years of age.  Your health care provider may recommend screening at an earlier age if you have risk factors for colon cancer.  Your health care provider may also recommend using home test kits to check for hidden blood in the stool.  A small camera at the end of a tube can be used to examine your colon directly (sigmoidoscopy or colonoscopy). This is done to check for the earliest forms of colorectal cancer.  Routine screening usually begins at age 38.  Direct examination of the colon should be repeated every 5-10 years through 81 years of age. However, you may need to be screened more often if early forms of precancerous polyps or small growths are found.  Skin Cancer  Check your skin from head to toe regularly.  Tell your health care provider about any new moles or changes in moles, especially if there is a change  in a mole's shape or color.  Also tell your health care provider if you have a mole that is larger than the size of a pencil eraser.  Always use sunscreen. Apply sunscreen liberally and repeatedly throughout the day.  Protect yourself by wearing long sleeves, pants, a wide-brimmed hat, and sunglasses whenever you are outside.  Heart disease, diabetes, and high blood pressure  High blood pressure causes heart disease and increases the risk of stroke. High blood pressure is more likely to develop in: ? People who have blood pressure in the high end of the normal range (130-139/85-89 mm Hg). ? People who are  overweight or obese. ? People who are African American.  If you are 9-31 years of age, have your blood pressure checked every 3-5 years. If you are 8 years of age or older, have your blood pressure checked every year. You should have your blood pressure measured twice-once when you are at a hospital or clinic, and once when you are not at a hospital or clinic. Record the average of the two measurements. To check your blood pressure when you are not at a hospital or clinic, you can use: ? An automated blood pressure machine at a pharmacy. ? A home blood pressure monitor.  If you are between 55 years and 18 years old, ask your health care provider if you should take aspirin to prevent strokes.  Have regular diabetes screenings. This involves taking a blood sample to check your fasting blood sugar level. ? If you are at a normal weight and have a low risk for diabetes, have this test once every three years after 81 years of age. ? If you are overweight and have a high risk for diabetes, consider being tested at a younger age or more often. Preventing infection Hepatitis B  If you have a higher risk for hepatitis B, you should be screened for this virus. You are considered at high risk for hepatitis B if: ? You were born in a country where hepatitis B is common. Ask your health care provider which countries are considered high risk. ? Your parents were born in a high-risk country, and you have not been immunized against hepatitis B (hepatitis B vaccine). ? You have HIV or AIDS. ? You use needles to inject street drugs. ? You live with someone who has hepatitis B. ? You have had sex with someone who has hepatitis B. ? You get hemodialysis treatment. ? You take certain medicines for conditions, including cancer, organ transplantation, and autoimmune conditions.  Hepatitis C  Blood testing is recommended for: ? Everyone born from [birth year] through 1963. ? Anyone with known risk factors for  hepatitis C.  Sexually transmitted infections (STIs)  You should be screened for sexually transmitted infections (STIs) including gonorrhea and chlamydia if: ? You are sexually active and are younger than 81 years of age. ? You are older than 81 years of age and your health care provider tells you that you are at risk for this type of infection. ? Your sexual activity has changed since you were last screened and you are at an increased risk for chlamydia or gonorrhea. Ask your health care provider if you are at risk.  If you do not have HIV, but are at risk, it may be recommended that you take a prescription medicine daily to prevent HIV infection. This is called pre-exposure prophylaxis (PrEP). You are considered at risk if: ? You are sexually active and do not regularly use  condoms or know the HIV status of your partner(s). ? You take drugs by injection. ? You are sexually active with a partner who has HIV.  Talk with your health care provider about whether you are at high risk of being infected with HIV. If you choose to begin PrEP, you should first be tested for HIV. You should then be tested every 3 months for as long as you are taking PrEP. Pregnancy  If you are premenopausal and you may become pregnant, ask your health care provider about preconception counseling.  If you may become pregnant, take 400 to 800 micrograms (mcg) of folic acid every day.  If you want to prevent pregnancy, talk to your health care provider about birth control (contraception). Osteoporosis and menopause  Osteoporosis is a disease in which the bones lose minerals and strength with aging. This can result in serious bone fractures. Your risk for osteoporosis can be identified using a bone density scan.  If you are 80 years of age or older, or if you are at risk for osteoporosis and fractures, ask your health care provider if you should be screened.  Ask your health care provider whether you should take a  calcium or vitamin D supplement to lower your risk for osteoporosis.  Menopause may have certain physical symptoms and risks.  Hormone replacement therapy may reduce some of these symptoms and risks. Talk to your health care provider about whether hormone replacement therapy is right for you. Follow these instructions at home:  Schedule regular health, dental, and eye exams.  Stay current with your immunizations.  Do not use any tobacco products including cigarettes, chewing tobacco, or electronic cigarettes.  If you are pregnant, do not drink alcohol.  If you are breastfeeding, limit how much and how often you drink alcohol.  Limit alcohol intake to no more than 1 drink per day for nonpregnant women. One drink equals 12 ounces of beer, 5 ounces of wine, or 1 ounces of hard liquor.  Do not use street drugs.  Do not share needles.  Ask your health care provider for help if you need support or information about quitting drugs.  Tell your health care provider if you often feel depressed.  Tell your health care provider if you have ever been abused or do not feel safe at home. This information is not intended to replace advice given to you by your health care provider. Make sure you discuss any questions you have with your health care provider. Document Released: 11/20/2010 Document Revised: 10/13/2015 Document Reviewed: 02/08/2015 Elsevier Interactive Patient Education  2018 [employer].   Hearing Loss Hearing loss is a partial or total loss of the ability to hear. This can be temporary or permanent, and it can happen in one or both ears. Hearing loss may be referred to as deafness. Medical care is necessary to treat hearing loss properly and to prevent the condition from getting worse. Your hearing may partially or completely come back, depending on what caused your hearing loss and how severe it is. In some cases, hearing loss is permanent. What are the causes? Common causes  of hearing loss include:  Too much wax in the ear canal.  Infection of the ear canal or middle ear.  Fluid in the middle ear.  Injury to the ear or surrounding area.  An object stuck in the ear.  Prolonged exposure to loud sounds, such as music.  Less common causes of hearing loss include:  Tumors in the ear.  Viral or bacterial infections, such as meningitis.  A hole in the eardrum (perforated eardrum).  Problems with the hearing nerve that sends signals between the brain and the ear.  Certain medicines.  What are the signs or symptoms? Symptoms of this condition may include:  Difficulty telling the difference between sounds.  Difficulty following a conversation when there is background noise.  Lack of response to sounds in your environment. This may be most noticeable when you do not respond to startling sounds.  Needing to turn up the volume on the television, radio, etc.  Ringing in the ears.  Dizziness.  Pain in the ears.  How is this diagnosed? This condition is diagnosed based on a physical exam and a hearing test (audiometry). The audiometry test will be performed by a hearing specialist (audiologist). You may also be referred to an ear, nose, and throat (ENT) specialist (otolaryngologist). How is this treated? Treatment for recent onset of hearing loss may include:  Ear wax removal.  Being prescribed medicines to prevent infection (antibiotics).  Being prescribed medicines to reduce inflammation (corticosteroids).  Follow these instructions at home:  If you were prescribed an antibiotic medicine, take it as told by your health care provider. Do not stop taking the antibiotic even if you start to feel better.  Take over-the-counter and prescription medicines only as told by your health care provider.  Avoid loud noises.  Return to your normal activities as told by your health care provider. Ask your health care provider what activities are safe  for you.  Keep all follow-up visits as told by your health care provider. This is important. Contact a health care provider if:  You feel dizzy.  You develop new symptoms.  You vomit or feel nauseous.  You have a fever. Get help right away if:  You develop sudden changes in your vision.  You have severe ear pain.  You have new or increased weakness.  You have a severe headache. This information is not intended to replace advice given to you by your health care provider. Make sure you discuss any questions you have with your health care provider. Document Released: 05/07/2005 Document Revised: 10/13/2015 Document Reviewed: 09/22/2014 Elsevier Interactive Patient Education  2018 [employer].

## 2017-01-03 NOTE — Telephone Encounter (Signed)
Advise her to take 1/2 tablet (or 50 mg) of Losartan daily

## 2017-01-03 NOTE — Telephone Encounter (Signed)
The patient in today for AWV.w dtr.  Dr. Maudie Mercury reviewed note this afternoon.   BP was 102/58 The daughter mentioned the patient's blood pressure has been running around 100/60. The daughter states the patient has not taken her hydrochlorothiazide  since March. The patient states she is taking her Lasix 20 mg only one time per day in the morning. The daughter asked if her blood pressure medicine and could be slightly reduced. Patient denies any dizziness and balance issues and had one fall at the mailbox with no incident. Not sure why she fell. No edema or sob   Will make an apt for November for annual   Please advise as to any changes. Tks.

## 2017-01-03 NOTE — Progress Notes (Signed)
Reviewed in PCP absence and agree. Colin Benton R., DO

## 2017-01-04 NOTE — Telephone Encounter (Signed)
Call to [facility] and ms. [place] LVM with Hilda Blades regarding new dose of losartan 50mg  Also spoke with Ms. Hawker to decrease losartan to 1/2 tablet 50mg . Noted on med sheet  [place]

## 2017-01-16 ENCOUNTER — Telehealth: Payer: Self-pay | Admitting: Family Medicine

## 2017-01-16 MED ORDER — HYDROCODONE-ACETAMINOPHEN 5-325 MG PO TABS: 1.0000 | ORAL_TABLET | Freq: Four times a day (QID) | ORAL | 0 refills | Status: DC | PRN

## 2017-01-16 NOTE — Telephone Encounter (Signed)
done

## 2017-01-16 NOTE — Telephone Encounter (Signed)
Pt needs new rx hydrocodone by next Thursday 01-24-17

## 2017-01-16 NOTE — Telephone Encounter (Signed)
Script is ready for pick up here at front office and I spoke with pt.  

## 2017-01-17 ENCOUNTER — Telehealth: Payer: Self-pay | Admitting: Family Medicine

## 2017-01-17 NOTE — Telephone Encounter (Signed)
Called pt she gave auth for son Skip Estimable Glab to pick up her scrip. He provided ID and signed log. cb

## 2017-02-06 ENCOUNTER — Telehealth: Payer: Self-pay | Admitting: Family Medicine

## 2017-02-06 NOTE — Telephone Encounter (Signed)
Request from [facility] to change Valsatan 320 mg due to recall, I didn't see medication on current list. Did we already change?

## 2017-02-06 NOTE — Telephone Encounter (Signed)
We already changed this to Losartan

## 2017-03-13 ENCOUNTER — Ambulatory Visit (INDEPENDENT_AMBULATORY_CARE_PROVIDER_SITE_OTHER): Payer: Medicare Other | Admitting: *Deleted

## 2017-03-13 DIAGNOSIS — Z23 Encounter for immunization: Secondary | ICD-10-CM | POA: Diagnosis not present

## 2017-03-19 DIAGNOSIS — M1711 Unilateral primary osteoarthritis, right knee: Secondary | ICD-10-CM | POA: Diagnosis not present

## 2017-03-19 DIAGNOSIS — M1712 Unilateral primary osteoarthritis, left knee: Secondary | ICD-10-CM | POA: Diagnosis not present

## 2017-03-19 DIAGNOSIS — M545 Low back pain: Secondary | ICD-10-CM | POA: Diagnosis not present

## 2017-04-10 ENCOUNTER — Other Ambulatory Visit: Payer: Self-pay | Admitting: Family Medicine

## 2017-04-25 ENCOUNTER — Encounter: Payer: Self-pay | Admitting: Family Medicine

## 2017-04-25 ENCOUNTER — Ambulatory Visit (INDEPENDENT_AMBULATORY_CARE_PROVIDER_SITE_OTHER): Payer: Medicare Other | Admitting: Family Medicine

## 2017-04-25 VITALS — BP 118/62 | HR 60 | Temp 97.8°F | Ht 61.0 in | Wt 136.6 lb

## 2017-04-25 DIAGNOSIS — K589 Irritable bowel syndrome without diarrhea: Secondary | ICD-10-CM | POA: Diagnosis not present

## 2017-04-25 DIAGNOSIS — G8929 Other chronic pain: Secondary | ICD-10-CM | POA: Diagnosis not present

## 2017-04-25 DIAGNOSIS — M5440 Lumbago with sciatica, unspecified side: Secondary | ICD-10-CM | POA: Diagnosis not present

## 2017-04-25 DIAGNOSIS — M150 Primary generalized (osteo)arthritis: Secondary | ICD-10-CM | POA: Diagnosis not present

## 2017-04-25 DIAGNOSIS — E785 Hyperlipidemia, unspecified: Secondary | ICD-10-CM | POA: Diagnosis not present

## 2017-04-25 DIAGNOSIS — M159 Polyosteoarthritis, unspecified: Secondary | ICD-10-CM

## 2017-04-25 DIAGNOSIS — M8949 Other hypertrophic osteoarthropathy, multiple sites: Secondary | ICD-10-CM

## 2017-04-25 DIAGNOSIS — I10 Essential (primary) hypertension: Secondary | ICD-10-CM | POA: Diagnosis not present

## 2017-04-25 LAB — LIPID PANEL
CHOLESTEROL: 108 mg/dL (ref 0–200)
HDL: 41.2 mg/dL (ref >39.00)
LDL Cholesterol: 46 mg/dL (ref 0–99)
NONHDL: 67.04
TRIGLYCERIDES: 106 mg/dL (ref 0.0–149.0)
Total CHOL/HDL Ratio: 3
VLDL: 21.2 mg/dL (ref 0.0–40.0)

## 2017-04-25 LAB — HEPATIC FUNCTION PANEL
ALBUMIN: 3.8 g/dL (ref 3.5–5.2)
ALT: 10 U/L (ref 0–35)
AST: 19 U/L (ref 0–37)
Alkaline Phosphatase: 72 U/L (ref 39–117)
BILIRUBIN DIRECT: 0.1 mg/dL (ref 0.0–0.3)
TOTAL PROTEIN: 6.4 g/dL (ref 6.0–8.3)
Total Bilirubin: 0.7 mg/dL (ref 0.2–1.2)

## 2017-04-25 LAB — BASIC METABOLIC PANEL
BUN: 15 mg/dL (ref 6–23)
CALCIUM: 9.1 mg/dL (ref 8.4–10.5)
CHLORIDE: 102 meq/L (ref 96–112)
CO2: 31 meq/L (ref 19–32)
CREATININE: 0.84 mg/dL (ref 0.40–1.20)
GFR: 67.75 mL/min (ref >60.00)
GLUCOSE: 91 mg/dL (ref 70–99)
Potassium: 4.4 mEq/L (ref 3.5–5.1)
Sodium: 138 mEq/L (ref 135–145)

## 2017-04-25 LAB — CBC WITH DIFFERENTIAL/PLATELET
BASOS ABS: 0 10*3/uL (ref 0.0–0.1)
Basophils Relative: 0.2 % (ref 0.0–3.0)
EOS ABS: 0.1 10*3/uL (ref 0.0–0.7)
Eosinophils Relative: 2.2 % (ref 0.0–5.0)
HEMATOCRIT: 43 % (ref 36.0–46.0)
HEMOGLOBIN: 14 g/dL (ref 12.0–15.0)
LYMPHS PCT: 29.1 % (ref 12.0–46.0)
Lymphs Abs: 1.9 10*3/uL (ref 0.7–4.0)
MCHC: 32.7 g/dL (ref 30.0–36.0)
MCV: 94.8 fl (ref 78.0–100.0)
Monocytes Absolute: 0.5 10*3/uL (ref 0.1–1.0)
Monocytes Relative: 7.9 % (ref 3.0–12.0)
Neutro Abs: 4 10*3/uL (ref 1.4–7.7)
Neutrophils Relative %: 60.6 % (ref 43.0–77.0)
Platelets: 255 10*3/uL (ref 150.0–400.0)
RBC: 4.53 Mil/uL (ref 3.87–5.11)
RDW: 14 % (ref 11.5–15.5)
WBC: 6.6 10*3/uL (ref 4.0–10.5)

## 2017-04-25 LAB — POC URINALSYSI DIPSTICK (AUTOMATED)
Glucose, UA: NEGATIVE
Leukocytes, UA: NEGATIVE
Nitrite, UA: NEGATIVE
PH UA: 6 (ref 5.0–8.0)
RBC UA: NEGATIVE
Spec Grav, UA: 1.025 (ref 1.010–1.025)
UROBILINOGEN UA: 1 U/dL

## 2017-04-25 LAB — TSH: TSH: 3.05 u[IU]/mL (ref 0.35–4.50)

## 2017-04-25 MED ORDER — METOPROLOL TARTRATE 50 MG PO TABS: ORAL_TABLET | ORAL | 3 refills | Status: DC

## 2017-04-25 MED ORDER — POTASSIUM CHLORIDE CRYS ER 20 MEQ PO TBCR: 20.0000 meq | EXTENDED_RELEASE_TABLET | Freq: Every day | ORAL | 3 refills | Status: DC

## 2017-04-25 MED ORDER — HYDROCHLOROTHIAZIDE 25 MG PO TABS: ORAL_TABLET | ORAL | 3 refills | Status: DC

## 2017-04-25 MED ORDER — HYDROCODONE-ACETAMINOPHEN 5-325 MG PO TABS: 1.0000 | ORAL_TABLET | Freq: Four times a day (QID) | ORAL | 0 refills | Status: DC | PRN

## 2017-04-25 NOTE — Progress Notes (Signed)
   Subjective:    Patient ID: Shirley Wright, female    DOB: 1925-10-22, 83 y.o.   MRN: [id number]  HPI Here with her daughter to follow up on issues. She feels fine. They have noted her BP to be low at home, often in the 110/60 range.    Review of Systems  Constitutional: Negative.   HENT: Negative.   Eyes: Negative.   Respiratory: Negative.   Cardiovascular: Negative.   Gastrointestinal: Negative.   Genitourinary: Negative for decreased urine volume, difficulty urinating, dyspareunia, dysuria, enuresis, flank pain, frequency, hematuria, pelvic pain and urgency.  Musculoskeletal: Negative.   Skin: Negative.   Neurological: Negative.   Psychiatric/Behavioral: Negative.        Objective:   Physical Exam  Constitutional: She is oriented to person, place, and time. She appears well-developed and well-nourished. No distress.  HENT:  Head: Normocephalic and atraumatic.  Right Ear: External ear normal.  Left Ear: External ear normal.  Nose: Nose normal.  Mouth/Throat: Oropharynx is clear and moist. No oropharyngeal exudate.  Eyes: Conjunctivae and EOM are normal. Pupils are equal, round, and reactive to light. No scleral icterus.  Neck: Normal range of motion. Neck supple. No JVD present. No thyromegaly present.  Cardiovascular: Normal rate, regular rhythm, normal heart sounds and intact distal pulses. Exam reveals no gallop and no friction rub.  No murmur heard. Pulmonary/Chest: Effort normal and breath sounds normal. No respiratory distress. She has no wheezes. She has no rales. She exhibits no tenderness.  Abdominal: Soft. Bowel sounds are normal. She exhibits no distension and no mass. There is no tenderness. There is no rebound and no guarding.  Musculoskeletal: Normal range of motion. She exhibits no edema or tenderness.  Lymphadenopathy:    She has no cervical adenopathy.  Neurological: She is alert and oriented to person, place, and time. She has normal reflexes. No  cranial nerve deficit. She exhibits normal muscle tone. Coordination normal.  Skin: Skin is warm and dry. No rash noted. No erythema.  Psychiatric: She has a normal mood and affect. Her behavior is normal. Judgment and thought content normal.          Assessment & Plan:  Her HTN is well controlled but she is on too much medication. We will stop the Amlodipine and they will monitor her BP closely at home. Her joint pains and low back pain are stable. We discussed diet and exercise advice. Get fasting labs today to include a lipid panel.  Alysia Penna, MD

## 2018-01-07 ENCOUNTER — Ambulatory Visit: Payer: Medicare Other

## 2018-01-14 ENCOUNTER — Ambulatory Visit (INDEPENDENT_AMBULATORY_CARE_PROVIDER_SITE_OTHER): Payer: Medicare Other | Admitting: Family Medicine

## 2018-01-14 ENCOUNTER — Encounter: Payer: Self-pay | Admitting: Family Medicine

## 2018-01-14 VITALS — BP 110/60 | HR 60 | Temp 97.6°F | Ht 61.0 in | Wt 130.8 lb

## 2018-01-14 DIAGNOSIS — Z0000 Encounter for general adult medical examination without abnormal findings: Secondary | ICD-10-CM

## 2018-01-14 DIAGNOSIS — R413 Other amnesia: Secondary | ICD-10-CM | POA: Diagnosis not present

## 2018-01-14 LAB — POCT URINALYSIS DIPSTICK
BILIRUBIN UA: NEGATIVE
GLUCOSE UA: NEGATIVE
KETONES UA: NEGATIVE
Leukocytes, UA: NEGATIVE
Nitrite, UA: NEGATIVE
PH UA: 7 (ref 5.0–8.0)
Protein, UA: POSITIVE — AB
RBC UA: NEGATIVE
Spec Grav, UA: 1.015 (ref 1.010–1.025)
UROBILINOGEN UA: 1 U/dL

## 2018-01-14 LAB — BASIC METABOLIC PANEL
BUN: 15 mg/dL (ref 6–23)
CHLORIDE: 92 meq/L — AB (ref 96–112)
CO2: 32 mEq/L (ref 19–32)
Calcium: 9.8 mg/dL (ref 8.4–10.5)
Creatinine, Ser: 0.86 mg/dL (ref 0.40–1.20)
GFR: 65.83 mL/min (ref >60.00)
GLUCOSE: 114 mg/dL — AB (ref 70–99)
Potassium: 3.1 mEq/L — ABNORMAL LOW (ref 3.5–5.1)
Sodium: 132 mEq/L — ABNORMAL LOW (ref 135–145)

## 2018-01-14 LAB — HEPATIC FUNCTION PANEL
ALBUMIN: 3.9 g/dL (ref 3.5–5.2)
ALT: 7 U/L (ref 0–35)
AST: 14 U/L (ref 0–37)
Alkaline Phosphatase: 80 U/L (ref 39–117)
BILIRUBIN TOTAL: 0.7 mg/dL (ref 0.2–1.2)
Bilirubin, Direct: 0.1 mg/dL (ref 0.0–0.3)
Total Protein: 7.1 g/dL (ref 6.0–8.3)

## 2018-01-14 LAB — CBC WITH DIFFERENTIAL/PLATELET
Basophils Absolute: 0 10*3/uL (ref 0.0–0.1)
Basophils Relative: 0.3 % (ref 0.0–3.0)
EOS ABS: 0.1 10*3/uL (ref 0.0–0.7)
Eosinophils Relative: 1.3 % (ref 0.0–5.0)
HCT: 41.2 % (ref 36.0–46.0)
Hemoglobin: 14.3 g/dL (ref 12.0–15.0)
LYMPHS ABS: 2.2 10*3/uL (ref 0.7–4.0)
Lymphocytes Relative: 32 % (ref 12.0–46.0)
MCHC: 34.7 g/dL (ref 30.0–36.0)
MCV: 90.1 fl (ref 78.0–100.0)
MONO ABS: 0.5 10*3/uL (ref 0.1–1.0)
Monocytes Relative: 6.8 % (ref 3.0–12.0)
NEUTROS ABS: 4.2 10*3/uL (ref 1.4–7.7)
NEUTROS PCT: 59.6 % (ref 43.0–77.0)
PLATELETS: 284 10*3/uL (ref 150.0–400.0)
RBC: 4.57 Mil/uL (ref 3.87–5.11)
RDW: 14.1 % (ref 11.5–15.5)
WBC: 7 10*3/uL (ref 4.0–10.5)

## 2018-01-14 LAB — VITAMIN B12: VITAMIN B 12: 193 pg/mL — AB (ref 211–911)

## 2018-01-14 LAB — TSH: TSH: 2.92 u[IU]/mL (ref 0.35–4.50)

## 2018-01-14 MED ORDER — DONEPEZIL HCL 5 MG PO TABS: 5.0000 mg | ORAL_TABLET | Freq: Every day | ORAL | 0 refills | Status: DC

## 2018-01-14 NOTE — Progress Notes (Signed)
   Subjective:    Patient ID: Shirley Wright, female    DOB: 08-01-1925, 84 y.o.   MRN: [id number]  HPI Here with her daughter to discuss some memory issues. Her daughter says that over the past few months Eily will often ask where her husband is, or try to phone him, or look for him at their family business (even though he passed away 2 years ago). Then at other times Tamyah realizes he is gone. She often forgets when they have conversations on the phone or even when the family comes to visit her. She often asks the same questions over and over in the course of a conversation. She sleeps well. She says her appetite is good, although she has lost about 7 lbs in the past 8 months. She admits to feeling sad at times and to missing her late husband, but she denies ever having crying spells for instance.    Review of Systems  Constitutional: Negative.   Respiratory: Negative.   Cardiovascular: Negative.   Neurological: Negative.   Psychiatric/Behavioral: Positive for confusion. Negative for agitation, decreased concentration, dysphoric mood, hallucinations and sleep disturbance. The patient is not nervous/anxious.        Objective:   Physical Exam  Constitutional: She is oriented to person, place, and time. She appears well-developed and well-nourished.  Cardiovascular: Normal rate, regular rhythm, normal heart sounds and intact distal pulses.  Pulmonary/Chest: Effort normal and breath sounds normal.  Neurological: She is alert and oriented to person, place, and time.  Psychiatric: She has a normal mood and affect. Her behavior is normal. Thought content normal.          Assessment & Plan:  She seems to be having some memory problems, and it does not seem to be related to grief or depression. We will check some labs today to rule out metabolic problems. She will try Aricept 5 mg daily and recheck here in 4 weeks. We spent 35 minutes today discussing this.  Alysia Penna, MD

## 2018-02-11 ENCOUNTER — Telehealth: Payer: Self-pay | Admitting: Family Medicine

## 2018-02-11 MED ORDER — DONEPEZIL HCL 5 MG PO TABS: 5.0000 mg | ORAL_TABLET | Freq: Every day | ORAL | 0 refills | Status: DC

## 2018-02-11 NOTE — Telephone Encounter (Signed)
Donepezil 5 mg  refill Last Refill:01/14/18 # 30 Last OV: 01/14/18 PCP: [place]: [facility]  [phone]

## 2018-02-11 NOTE — Telephone Encounter (Signed)
Copied from [place] [id number]. Topic: Quick Communication - See Telephone Encounter >> Feb 11, 2018  3:27 PM Hewitt Shorts wrote: Pt daughter called stating that the memory medication donepezil and they are needing a refill   [facility]  Best number [phone]

## 2018-02-20 ENCOUNTER — Ambulatory Visit: Payer: Medicare Other | Admitting: Family Medicine

## 2018-02-20 ENCOUNTER — Encounter: Payer: Self-pay | Admitting: Family Medicine

## 2018-02-20 VITALS — BP 110/62 | HR 55 | Temp 97.9°F | Wt 128.0 lb

## 2018-02-20 DIAGNOSIS — B351 Tinea unguium: Secondary | ICD-10-CM | POA: Diagnosis not present

## 2018-02-20 DIAGNOSIS — Z23 Encounter for immunization: Secondary | ICD-10-CM

## 2018-02-20 DIAGNOSIS — R413 Other amnesia: Secondary | ICD-10-CM

## 2018-02-20 MED ORDER — DONEPEZIL HCL 5 MG PO TABS: 10.0000 mg | ORAL_TABLET | Freq: Every day | ORAL | 0 refills | Status: DC

## 2018-02-20 NOTE — Progress Notes (Signed)
   Subjective:    Patient ID: Shirley Wright, female    DOB: [birth year], 84 y.o.   MRN: [id number]  HPI Here with her daughter to follow up on memory loss and to look at her toenails. For the past month she has been taking Aricept 5 mg daily. Neither the patient or her daughter can tell it has made any difference. She tolerates it well. Also the daughter recently noticed Shena's toenails are long and thick, and they are difficult to trim.    Review of Systems  Constitutional: Negative.   Respiratory: Negative.   Cardiovascular: Negative.   Neurological: Negative.   Psychiatric/Behavioral: Positive for confusion. Negative for agitation and dysphoric mood. The patient is not nervous/anxious.        Objective:   Physical Exam  Constitutional: She appears well-developed and well-nourished.  Cardiovascular: Normal rate, regular rhythm, normal heart sounds and intact distal pulses.  Pulmonary/Chest: Effort normal and breath sounds normal.  Neurological: She is alert.  Skin:  All toenails are very thick and curved   We performed a Mini Mental Status Exam today and she correctly answered 25 out of 35 questions.          Assessment & Plan:  She has mild cognitive impairment. We will increase the dose of Aricept to 10 mg daily. Recheck in one month. We will refer her to Podiatry to trim the toenails. Alysia Penna, MD

## 2018-03-05 ENCOUNTER — Ambulatory Visit: Payer: Medicare Other | Admitting: Podiatry

## 2018-03-17 ENCOUNTER — Other Ambulatory Visit: Payer: Self-pay | Admitting: Family Medicine

## 2018-03-17 NOTE — Telephone Encounter (Signed)
Copied from [place] [id number]. Topic: Quick Communication - Rx Refill/Question >> Mar 17, 2018 12:36 PM [place], Lanice Schwab wrote: Medication: donepezil (ARICEPT) 5 MG tablet  Has the patient contacted their pharmacy? YES (Agent: If no, request that the patient contact the pharmacy for the refill.) (Agent: If yes, when and what did the pharmacy advise?)  Preferred Pharmacy (with phone number or street name): [facility] [place] [phone] (Phone)   Agent: Please be advised that RX refills may take up to 3 business days. We ask that you follow-up with your pharmacy.

## 2018-03-18 MED ORDER — DONEPEZIL HCL 5 MG PO TABS: 10.0000 mg | ORAL_TABLET | Freq: Every day | ORAL | 0 refills | Status: DC

## 2018-03-18 NOTE — Telephone Encounter (Signed)
Requested Prescriptions  Pending Prescriptions Disp Refills  . donepezil (ARICEPT) 5 MG tablet 30 tablet 0    Sig: Take 2 tablets (10 mg total) by mouth at bedtime.     Neurology:  Alzheimer's Agents Passed - 03/17/2018  3:21 PM      Passed - Valid encounter within last 6 months    Recent Outpatient Visits          3 weeks ago Onychomycosis   [facility] [place], MD   2 months ago Memory loss   [facility], Ishmael Holter, MD   10 months ago Essential hypertension   [facility], Ishmael Holter, MD   1 year ago Herpes zoster without complication   [facility], Nickola Major, DO   1 year ago Lower extremity edema   [facility], [place], DO

## 2018-03-24 ENCOUNTER — Encounter: Payer: Self-pay | Admitting: Podiatry

## 2018-03-24 ENCOUNTER — Ambulatory Visit: Payer: Medicare Other | Admitting: Podiatry

## 2018-03-24 VITALS — BP 157/82 | HR 57

## 2018-03-24 DIAGNOSIS — B351 Tinea unguium: Secondary | ICD-10-CM

## 2018-03-24 DIAGNOSIS — M79676 Pain in unspecified toe(s): Secondary | ICD-10-CM | POA: Diagnosis not present

## 2018-03-25 NOTE — Progress Notes (Signed)
   SUBJECTIVE Patient presents to office today complaining of elongated, thickened nails that cause pain while ambulating in shoes. She is unable to trim her own nails. Patient is here for further evaluation and treatment.  Past Medical History:  Diagnosis Date  . Adenomatous colon polyp    tubular  . Allergic rhinitis   . Diverticulosis   . GERD (gastroesophageal reflux disease)   . Hemorrhoids   . Hepatic cyst   . History of colonic polyps   . Hyperlipidemia   . Hypertension   . IBS (irritable bowel syndrome)   . Osteoarthritis     OBJECTIVE General Patient is awake, alert, and oriented x 3 and in no acute distress. Derm Skin is dry and supple bilateral. Negative open lesions or macerations. Remaining integument unremarkable. Nails are tender, long, thickened and dystrophic with subungual debris, consistent with onychomycosis, 1-5 bilateral. No signs of infection noted. Vasc  DP and PT pedal pulses palpable bilaterally. Temperature gradient within normal limits.  Neuro Epicritic and protective threshold sensation grossly intact bilaterally.  Musculoskeletal Exam No symptomatic pedal deformities noted bilateral. Muscular strength within normal limits.  ASSESSMENT 1. Onychodystrophic nails 1-5 bilateral with hyperkeratosis of nails.  2. Onychomycosis of nail due to dermatophyte bilateral 3. Pain in foot bilateral  PLAN OF CARE 1. Patient evaluated today.  2. Instructed to maintain good pedal hygiene and foot care.  3. Mechanical debridement of nails 1-5 bilaterally performed using a nail nipper. Filed with dremel without incident.  4. Return to clinic in 3 mos.    Edrick Kins, DPM [facility]  Dr. Edrick Kins, [address]                                        [place], [place] [address]                Office [phone]  Fax [phone]

## 2018-05-15 ENCOUNTER — Ambulatory Visit (INDEPENDENT_AMBULATORY_CARE_PROVIDER_SITE_OTHER): Payer: Medicare Other | Admitting: Family Medicine

## 2018-05-15 ENCOUNTER — Encounter: Payer: Self-pay | Admitting: Family Medicine

## 2018-05-15 VITALS — BP 128/74 | HR 78 | Temp 97.5°F | Ht 60.5 in | Wt 129.2 lb

## 2018-05-15 DIAGNOSIS — I10 Essential (primary) hypertension: Secondary | ICD-10-CM

## 2018-05-15 DIAGNOSIS — M150 Primary generalized (osteo)arthritis: Secondary | ICD-10-CM

## 2018-05-15 DIAGNOSIS — E538 Deficiency of other specified B group vitamins: Secondary | ICD-10-CM | POA: Diagnosis not present

## 2018-05-15 DIAGNOSIS — K219 Gastro-esophageal reflux disease without esophagitis: Secondary | ICD-10-CM

## 2018-05-15 DIAGNOSIS — M159 Polyosteoarthritis, unspecified: Secondary | ICD-10-CM

## 2018-05-15 DIAGNOSIS — E785 Hyperlipidemia, unspecified: Secondary | ICD-10-CM

## 2018-05-15 DIAGNOSIS — R413 Other amnesia: Secondary | ICD-10-CM | POA: Diagnosis not present

## 2018-05-15 DIAGNOSIS — M8949 Other hypertrophic osteoarthropathy, multiple sites: Secondary | ICD-10-CM

## 2018-05-15 LAB — CBC WITH DIFFERENTIAL/PLATELET
Basophils Absolute: 0 10*3/uL (ref 0.0–0.1)
Basophils Relative: 0.2 % (ref 0.0–3.0)
EOS ABS: 0.1 10*3/uL (ref 0.0–0.7)
Eosinophils Relative: 0.8 % (ref 0.0–5.0)
HCT: 39.9 % (ref 36.0–46.0)
Hemoglobin: 13.5 g/dL (ref 12.0–15.0)
Lymphocytes Relative: 27.8 % (ref 12.0–46.0)
Lymphs Abs: 2 10*3/uL (ref 0.7–4.0)
MCHC: 33.8 g/dL (ref 30.0–36.0)
MCV: 91.9 fl (ref 78.0–100.0)
Monocytes Absolute: 0.5 10*3/uL (ref 0.1–1.0)
Monocytes Relative: 6.6 % (ref 3.0–12.0)
NEUTROS PCT: 64.6 % (ref 43.0–77.0)
Neutro Abs: 4.6 10*3/uL (ref 1.4–7.7)
Platelets: 238 10*3/uL (ref 150.0–400.0)
RBC: 4.34 Mil/uL (ref 3.87–5.11)
RDW: 13.3 % (ref 11.5–15.5)
WBC: 7.2 10*3/uL (ref 4.0–10.5)

## 2018-05-15 LAB — POC URINALSYSI DIPSTICK (AUTOMATED)
Bilirubin, UA: NEGATIVE
Glucose, UA: NEGATIVE
Ketones, UA: NEGATIVE
Leukocytes, UA: NEGATIVE
Nitrite, UA: NEGATIVE
Protein, UA: NEGATIVE
RBC UA: NEGATIVE
Spec Grav, UA: 1.02 (ref 1.010–1.025)
Urobilinogen, UA: 0.2 E.U./dL
pH, UA: 6 (ref 5.0–8.0)

## 2018-05-15 LAB — HEPATIC FUNCTION PANEL
ALBUMIN: 3.8 g/dL (ref 3.5–5.2)
ALT: 7 U/L (ref 0–35)
AST: 13 U/L (ref 0–37)
Alkaline Phosphatase: 69 U/L (ref 39–117)
Bilirubin, Direct: 0.1 mg/dL (ref 0.0–0.3)
TOTAL PROTEIN: 6.7 g/dL (ref 6.0–8.3)
Total Bilirubin: 0.6 mg/dL (ref 0.2–1.2)

## 2018-05-15 LAB — LIPID PANEL
CHOLESTEROL: 193 mg/dL (ref 0–200)
HDL: 40.7 mg/dL (ref >39.00)
LDL Cholesterol: 118 mg/dL — ABNORMAL HIGH (ref 0–99)
NonHDL: 152.45
Total CHOL/HDL Ratio: 5
Triglycerides: 171 mg/dL — ABNORMAL HIGH (ref 0.0–149.0)
VLDL: 34.2 mg/dL (ref 0.0–40.0)

## 2018-05-15 LAB — BASIC METABOLIC PANEL
BUN: 17 mg/dL (ref 6–23)
CHLORIDE: 104 meq/L (ref 96–112)
CO2: 28 meq/L (ref 19–32)
Calcium: 9.5 mg/dL (ref 8.4–10.5)
Creatinine, Ser: 0.81 mg/dL (ref 0.40–1.20)
GFR: 70.49 mL/min (ref >60.00)
GLUCOSE: 102 mg/dL — AB (ref 70–99)
Potassium: 4.3 mEq/L (ref 3.5–5.1)
Sodium: 138 mEq/L (ref 135–145)

## 2018-05-15 LAB — TSH: TSH: 3.04 u[IU]/mL (ref 0.35–4.50)

## 2018-05-15 LAB — VITAMIN B12: Vitamin B-12: 1112 pg/mL — ABNORMAL HIGH (ref 211–911)

## 2018-05-15 MED ORDER — METOPROLOL TARTRATE 50 MG PO TABS: ORAL_TABLET | ORAL | 3 refills | Status: DC

## 2018-05-15 MED ORDER — POTASSIUM CHLORIDE ER 10 MEQ PO TBCR: 10.0000 meq | EXTENDED_RELEASE_TABLET | Freq: Every day | ORAL | 3 refills | Status: AC

## 2018-05-15 NOTE — Progress Notes (Signed)
   Subjective:    Patient ID: Shirley Wright, female    DOB: Jun 05, 1925, 84 y.o.   MRN: [id number]  HPI Here with her daughter to follow up on issues. She feels great and has no concerns. Her daughter says the Aricept did not help her memory at all so they stopped it. They are not interested in trying any other medications for this. Her BP is stable.    Review of Systems  Constitutional: Negative.   HENT: Negative.   Eyes: Negative.   Respiratory: Negative.   Cardiovascular: Negative.   Gastrointestinal: Negative.   Genitourinary: Negative for decreased urine volume, difficulty urinating, dyspareunia, dysuria, enuresis, flank pain, frequency, hematuria, pelvic pain and urgency.  Musculoskeletal: Negative.   Skin: Negative.   Neurological: Negative.   Psychiatric/Behavioral: Negative.        Objective:   Physical Exam Constitutional:      General: She is not in acute distress.    Appearance: She is well-developed.  HENT:     Head: Normocephalic and atraumatic.     Right Ear: External ear normal.     Left Ear: External ear normal.     Nose: Nose normal.     Mouth/Throat:     Pharynx: No oropharyngeal exudate.  Eyes:     General: No scleral icterus.    Conjunctiva/sclera: Conjunctivae normal.     Pupils: Pupils are equal, round, and reactive to light.  Neck:     Musculoskeletal: Normal range of motion and neck supple.     Thyroid: No thyromegaly.     Vascular: No JVD.  Cardiovascular:     Rate and Rhythm: Normal rate and regular rhythm.     Heart sounds: Normal heart sounds. No murmur. No friction rub. No gallop.   Pulmonary:     Effort: Pulmonary effort is normal. No respiratory distress.     Breath sounds: Normal breath sounds. No wheezing or rales.  Chest:     Chest wall: No tenderness.  Abdominal:     General: Bowel sounds are normal. There is no distension.     Palpations: Abdomen is soft. There is no mass.     Tenderness: There is no abdominal tenderness.  There is no guarding or rebound.  Musculoskeletal: Normal range of motion.        General: No tenderness.  Lymphadenopathy:     Cervical: No cervical adenopathy.  Skin:    General: Skin is warm and dry.     Findings: No erythema or rash.  Neurological:     Mental Status: She is alert and oriented to person, place, and time.     Cranial Nerves: No cranial nerve deficit.     Motor: No abnormal muscle tone.     Coordination: Coordination normal.     Deep Tendon Reflexes: Reflexes are normal and symmetric. Reflexes normal.  Psychiatric:        Behavior: Behavior normal.        Thought Content: Thought content normal.        Judgment: Judgment normal.           Assessment & Plan:  She seems to be doing well other than the memory loss. Her HTN is stable. Her GERD and arthritis are stable. We will get fasting labs today.  Alysia Penna, MD

## 2018-05-16 ENCOUNTER — Encounter: Payer: Self-pay | Admitting: *Deleted

## 2018-06-25 ENCOUNTER — Ambulatory Visit: Payer: Medicare Other | Admitting: Podiatry

## 2018-07-14 ENCOUNTER — Emergency Department (HOSPITAL_BASED_OUTPATIENT_CLINIC_OR_DEPARTMENT_OTHER): Payer: Medicare Other

## 2018-07-14 ENCOUNTER — Encounter: Payer: Self-pay | Admitting: Podiatry

## 2018-07-14 ENCOUNTER — Observation Stay (HOSPITAL_BASED_OUTPATIENT_CLINIC_OR_DEPARTMENT_OTHER)
Admission: EM | Admit: 2018-07-14 | Discharge: 2018-07-17 | Disposition: A | Discharge status: Skilled Nursing Facility | Payer: Medicare Other | Attending: Internal Medicine | Admitting: Internal Medicine

## 2018-07-14 ENCOUNTER — Encounter (HOSPITAL_BASED_OUTPATIENT_CLINIC_OR_DEPARTMENT_OTHER): Payer: Self-pay | Admitting: *Deleted

## 2018-07-14 ENCOUNTER — Ambulatory Visit (INDEPENDENT_AMBULATORY_CARE_PROVIDER_SITE_OTHER): Payer: Medicare Other | Admitting: Podiatry

## 2018-07-14 ENCOUNTER — Other Ambulatory Visit: Payer: Self-pay

## 2018-07-14 DIAGNOSIS — R600 Localized edema: Secondary | ICD-10-CM

## 2018-07-14 DIAGNOSIS — M1990 Unspecified osteoarthritis, unspecified site: Secondary | ICD-10-CM | POA: Diagnosis not present

## 2018-07-14 DIAGNOSIS — F0390 Unspecified dementia without behavioral disturbance: Secondary | ICD-10-CM | POA: Diagnosis not present

## 2018-07-14 DIAGNOSIS — I10 Essential (primary) hypertension: Secondary | ICD-10-CM | POA: Diagnosis not present

## 2018-07-14 DIAGNOSIS — Z86010 Personal history of colonic polyps: Secondary | ICD-10-CM | POA: Diagnosis not present

## 2018-07-14 DIAGNOSIS — Z79899 Other long term (current) drug therapy: Secondary | ICD-10-CM | POA: Diagnosis not present

## 2018-07-14 DIAGNOSIS — M79676 Pain in unspecified toe(s): Secondary | ICD-10-CM

## 2018-07-14 DIAGNOSIS — I82412 Acute embolism and thrombosis of left femoral vein: Secondary | ICD-10-CM | POA: Diagnosis not present

## 2018-07-14 DIAGNOSIS — Z8249 Family history of ischemic heart disease and other diseases of the circulatory system: Secondary | ICD-10-CM | POA: Insufficient documentation

## 2018-07-14 DIAGNOSIS — K219 Gastro-esophageal reflux disease without esophagitis: Secondary | ICD-10-CM | POA: Diagnosis not present

## 2018-07-14 DIAGNOSIS — I82402 Acute embolism and thrombosis of unspecified deep veins of left lower extremity: Secondary | ICD-10-CM | POA: Diagnosis present

## 2018-07-14 DIAGNOSIS — Z981 Arthrodesis status: Secondary | ICD-10-CM | POA: Insufficient documentation

## 2018-07-14 DIAGNOSIS — D649 Anemia, unspecified: Secondary | ICD-10-CM | POA: Diagnosis not present

## 2018-07-14 DIAGNOSIS — E785 Hyperlipidemia, unspecified: Secondary | ICD-10-CM | POA: Insufficient documentation

## 2018-07-14 DIAGNOSIS — B351 Tinea unguium: Secondary | ICD-10-CM | POA: Diagnosis not present

## 2018-07-14 DIAGNOSIS — I82409 Acute embolism and thrombosis of unspecified deep veins of unspecified lower extremity: Secondary | ICD-10-CM | POA: Diagnosis present

## 2018-07-14 DIAGNOSIS — I82432 Acute embolism and thrombosis of left popliteal vein: Secondary | ICD-10-CM | POA: Diagnosis not present

## 2018-07-14 DIAGNOSIS — K589 Irritable bowel syndrome without diarrhea: Secondary | ICD-10-CM | POA: Insufficient documentation

## 2018-07-14 LAB — CBC WITH DIFFERENTIAL/PLATELET
Abs Immature Granulocytes: 0.06 10*3/uL (ref 0.00–0.07)
BASOS ABS: 0 10*3/uL (ref 0.0–0.1)
Basophils Relative: 0 %
Eosinophils Absolute: 0.2 10*3/uL (ref 0.0–0.5)
Eosinophils Relative: 3 %
HCT: 36.2 % (ref 36.0–46.0)
Hemoglobin: 11.3 g/dL — ABNORMAL LOW (ref 12.0–15.0)
IMMATURE GRANULOCYTES: 1 %
Lymphocytes Relative: 28 %
Lymphs Abs: 2 10*3/uL (ref 0.7–4.0)
MCH: 29.3 pg (ref 26.0–34.0)
MCHC: 31.2 g/dL (ref 30.0–36.0)
MCV: 93.8 fL (ref 80.0–100.0)
Monocytes Absolute: 0.7 10*3/uL (ref 0.1–1.0)
Monocytes Relative: 10 %
NRBC: 0 % (ref 0.0–0.2)
Neutro Abs: 4.1 10*3/uL (ref 1.7–7.7)
Neutrophils Relative %: 58 %
Platelets: 224 10*3/uL (ref 150–400)
RBC: 3.86 MIL/uL — ABNORMAL LOW (ref 3.87–5.11)
RDW: 13.7 % (ref 11.5–15.5)
WBC: 7.2 10*3/uL (ref 4.0–10.5)

## 2018-07-14 LAB — COMPREHENSIVE METABOLIC PANEL
ALT: 13 U/L (ref 0–44)
AST: 19 U/L (ref 15–41)
Albumin: 3.1 g/dL — ABNORMAL LOW (ref 3.5–5.0)
Alkaline Phosphatase: 74 U/L (ref 38–126)
Anion gap: 6 (ref 5–15)
BUN: 20 mg/dL (ref 8–23)
CO2: 24 mmol/L (ref 22–32)
Calcium: 8.9 mg/dL (ref 8.9–10.3)
Chloride: 103 mmol/L (ref 98–111)
Creatinine, Ser: 0.82 mg/dL (ref 0.44–1.00)
GFR calc non Af Amer: >60 mL/min (ref >60)
Glucose, Bld: 103 mg/dL — ABNORMAL HIGH (ref 70–99)
Potassium: 3.5 mmol/L (ref 3.5–5.1)
Sodium: 133 mmol/L — ABNORMAL LOW (ref 135–145)
Total Bilirubin: 0.4 mg/dL (ref 0.3–1.2)
Total Protein: 6.9 g/dL (ref 6.5–8.1)

## 2018-07-14 LAB — PROTIME-INR
INR: 1.1
PROTHROMBIN TIME: 13.9 s (ref 11.4–15.2)

## 2018-07-14 MED ORDER — HEPARIN (PORCINE) 25000 UT/250ML-% IV SOLN
1000.0000 [IU]/h | INTRAVENOUS | Status: DC
  Administered 2018-07-14 (×2): 1000 [IU]/h via INTRAVENOUS
  Filled 2018-07-14: qty 250

## 2018-07-14 MED ORDER — HEPARIN BOLUS VIA INFUSION
3500.0000 [IU] | Freq: Once | INTRAVENOUS | Status: AC
  Administered 2018-07-14: 3500 [IU] via INTRAVENOUS

## 2018-07-14 NOTE — ED Notes (Signed)
Urine and urine culture in lab if needed

## 2018-07-14 NOTE — Progress Notes (Signed)
Report received from [place] at [facility] at 2147. 2240 Patient arrived to room 1429 at 2240. Patient alert and oriented x4. Receiving Heparin drip at 26mL/hr and NS at [facility] via PIV R ac without difficulty. Denied pain. 2nd nurse, Janett Billow, in to assist with skin assessment. Tele monitoring personnel called and verified with patient identifiers and 2nd nurse witness. On call MD notified of patient's arrival.

## 2018-07-14 NOTE — Progress Notes (Signed)
Subjective: Shirley Wright presents today with painful, thick toenails 1-5 b/l that she cannot cut and which interfere with daily activities.  Pain is aggravated when wearing enclosed shoe gear.  Patient's daughter is present during the visit today.  Patient relates new problem of left lower extremity edema.  Patient states the swelling started about Friday or Saturday.  She relates the swelling is located from her thigh area down to her ankle.  She denies any episode of trauma.  Laurey Morale, MD is her PCP.  Last visit May 15, 2018.   Current Outpatient Medications:  .  HYDROcodone-acetaminophen (NORCO/VICODIN) 5-325 MG tablet, Take 1 tablet by mouth every 6 (six) hours as needed for moderate pain., Disp: 120 tablet, Rfl: 0 .  metoprolol tartrate (LOPRESSOR) 50 MG tablet, TAKE 1 BY MOUTH DAILY, Disp: 90 tablet, Rfl: 3 .  potassium chloride (KLOR-CON 10) 10 MEQ tablet, Take 1 tablet (10 mEq total) by mouth daily., Disp: 90 tablet, Rfl: 3 .  vitamin B-12 (CYANOCOBALAMIN) 1000 MCG tablet, Take 1,000 mcg by mouth daily., Disp: , Rfl:   Allergies  Allergen Reactions  . Penicillins     REACTION: unspecified  . Pneumococcal Vaccine Polyvalent     REACTION: unspecified  . Tetanus-Diphtheria Toxoids Td     REACTION: unspecified    Objective:  Vascular Examination: Capillary refill time immediate x 10 digits.  Dorsalis pedis and Posterior tibial pulses palpable b/l.  Digital hair absent x 10 digits.  Skin temperature gradient WNL right lower extremity. She does have increased warmth of the left lower extremity when compared to the right. She does have pain on calf compression of the left lower extremity.  Dermatological Examination: Skin with normal turgor, texture and tone b/l  Toenails 1-5 b/l discolored, thick, dystrophic with subungual debris and pain with palpation to nailbeds due to thickness of nails.  Musculoskeletal: Muscle strength 5 out of 5 to all lower  extremity muscle groups right lower extremity.  Muscle testing deferred left lower extremity due to edema.   Neurological: Epicritic and protective sensation grossly intact bilaterally  Assessment: 1. Painful onychomycosis toenails 1-5 b/l  2. New onset of unilateral edema LLE;  rule out DVT left lower extremity  Plan: 1. Discussed possible dx of DVT LLE and I recommended she have ER evaluate LLE. Daughter did not want to go to [facility] due to wait time, but will go to [facility]. 2. Toenails 1-5 b/l were debrided in length and girth without iatrogenic bleeding. 3. Patient to continue soft, supportive shoe gear. 4. Patient to report any pedal injuries to medical professional immediately. 5. Follow up 3 months and daughter will call and schedule appointment.  6. Patient/POA to call should there be a concern in the interim.

## 2018-07-14 NOTE — ED Triage Notes (Signed)
Swelling in her left leg x 2 days. No injury.

## 2018-07-14 NOTE — ED Provider Notes (Signed)
Emergency Department Provider Note   I have reviewed the triage vital signs and the nursing notes.   HISTORY  Chief Complaint Leg Pain   HPI ARIES KASA is a 85 y.o. female with PMH of GERD, HLD, HTN, and IBS presents to the emergency department for evaluation of atraumatic left leg pain and swelling.  Patient reports symptoms developing over the past 4 days.  She denies history of DVT.  No injury to the leg or falls.  Fevers or chills.  No associated rash.  Patient denies any chest pain or shortness of breath.  She is not on anticoagulation.  She states that her leg hurts "all over" and denies localized pain in any specific location.  No associated numbness.  Past Medical History:  Diagnosis Date  . Adenomatous colon polyp    tubular  . Allergic rhinitis   . Diverticulosis   . GERD (gastroesophageal reflux disease)   . Hemorrhoids   . Hepatic cyst   . History of colonic polyps   . Hyperlipidemia   . Hypertension   . IBS (irritable bowel syndrome)   . Osteoarthritis     Patient Active Problem List   Diagnosis Date Noted  . Acute deep vein thrombosis (DVT) of left lower extremity ([place]) 07/15/2018  . DVT (deep venous thrombosis) ([place]) 07/14/2018  . Onychomycosis 02/20/2018  . Memory loss 02/20/2018  . GERD 04/16/2007  . IRRITABLE BOWEL SYNDROME 04/16/2007  . Osteoarthritis 04/16/2007  . BACK PAIN, LUMBAR 04/16/2007  . COLONIC POLYPS, HX OF 04/16/2007  . Dyslipidemia 01/27/2007  . Essential hypertension 01/27/2007  . ALLERGIC RHINITIS 01/27/2007    Past Surgical History:  Procedure Laterality Date  . benign breast biopsy    . COLONOSCOPY  06 82 11   Dr Earlean Shawl,  benign polyp, diverticulosis, no repeats recommended  . ganglion cyst removed     left thumba  . LUMBAR FUSION     L3-4 and L4-5 per Dr. Carloyn Manner 2002  . TONSILLECTOMY    . VAGINAL HYSTERECTOMY     with ovaries intact   Allergies Penicillins; Pneumococcal vaccine polyvalent; and Tetanus-diphtheria  toxoids td  Family History  Problem Relation Age of Onset  . Arthritis Other   . Diabetes Other   . Hypertension Other   . Lung cancer Other   . Heart disease Other     Social History Social History   Tobacco Use  . Smoking status: Never Smoker  . Smokeless tobacco: Never Used  Substance Use Topics  . Alcohol use: No    Alcohol/week: 0.0 standard drinks    Comment: once every 2-3 months  . Drug use: No    Review of Systems  Constitutional: No fever/chills Eyes: No visual changes. ENT: No sore throat. Cardiovascular: Denies chest pain. Respiratory: Denies shortness of breath. Gastrointestinal: No abdominal pain.  No nausea, no vomiting.  No diarrhea.  No constipation. Genitourinary: Negative for dysuria. Musculoskeletal: Positive left leg pain and swelling.  Skin: Negative for rash. Neurological: Negative for headaches, focal weakness or numbness.  10-point ROS otherwise negative.  ____________________________________________   PHYSICAL EXAM:  VITAL SIGNS: ED Triage Vitals  Enc Vitals Group     BP 07/14/18 1720 (!) 169/80     Pulse Rate 07/14/18 1720 86     Resp 07/14/18 1720 16     Temp 07/14/18 1720 (!) 97.4 F (36.3 C)     Temp Source 07/14/18 1720 Oral     SpO2 07/14/18 1720 100 %  Weight 07/14/18 1721 130 lb (59 kg)     Height 07/14/18 1721 5\' 1"  (1.549 m)   Constitutional: Alert and oriented. Well appearing and in no acute distress. Eyes: Conjunctivae are normal. Head: Atraumatic. Nose: No congestion/rhinnorhea. Mouth/Throat: Mucous membranes are moist.  Neck: No stridor. Cardiovascular: Normal rate, regular rhythm. Good peripheral circulation. Grossly normal heart sounds.   Respiratory: Normal respiratory effort.  No retractions. Lungs CTAB. Gastrointestinal: Soft and nontender. No distention.  Musculoskeletal: Positive left leg swelling diffusely without rash or cellulitis. No gross deformities of extremities. Neurologic:  Normal speech and  language. No gross focal neurologic deficits are appreciated.  Skin:  Skin is warm, dry and intact. No rash noted.  ____________________________________________   LABS (all labs ordered are listed, but only abnormal results are displayed)  Labs Reviewed  COMPREHENSIVE METABOLIC PANEL - Abnormal; Notable for the following components:      Result Value   Sodium 133 (*)    Glucose, Bld 103 (*)    Albumin 3.1 (*)    All other components within normal limits  CBC WITH DIFFERENTIAL/PLATELET - Abnormal; Notable for the following components:   RBC 3.86 (*)    Hemoglobin 11.3 (*)    All other components within normal limits  BASIC METABOLIC PANEL - Abnormal; Notable for the following components:   Glucose, Bld 101 (*)    Calcium 8.5 (*)    Anion gap 4 (*)    All other components within normal limits  CBC - Abnormal; Notable for the following components:   RBC 3.26 (*)    Hemoglobin 9.6 (*)    HCT 31.5 (*)    All other components within normal limits  HEPARIN LEVEL (UNFRACTIONATED) - Abnormal; Notable for the following components:   Heparin Unfractionated 0.84 (*)    All other components within normal limits  PROTIME-INR  HEPARIN LEVEL (UNFRACTIONATED)   ____________________________________________  RADIOLOGY  US Venous Img Lower  Left (dvt Study)  Result Date: 07/14/2018 CLINICAL DATA:  Pain, swelling in left lower extremity EXAM: LEFT LOWER EXTREMITY VENOUS DOPPLER ULTRASOUND TECHNIQUE: Gray-scale sonography with graded compression, as well as color Doppler and duplex ultrasound were performed to evaluate the lower extremity deep venous systems from the level of the common femoral vein and including the common femoral, femoral, profunda femoral, popliteal and calf veins including the posterior tibial, peroneal and gastrocnemius veins when visible. The superficial great saphenous vein was also interrogated. Spectral Doppler was utilized to evaluate flow at rest and with distal  augmentation maneuvers in the common femoral, femoral and popliteal veins. COMPARISON:  None. FINDINGS: Contralateral Common Femoral Vein: Respiratory phasicity is normal and symmetric with the symptomatic side. No evidence of thrombus. Normal compressibility. Common Femoral Vein: Occlusive thrombus in the common femoral vein. Saphenofemoral Junction: Appears occluded. Profunda Femoral Vein: Occlusive thrombus. Femoral Vein: Occlusive thrombus. Popliteal Vein: Occlusive thrombus. Calf Veins: No evidence of thrombus. Normal compressibility and flow on color Doppler imaging. Superficial Great Saphenous Vein: Not well visualized. Venous Reflux:  None. Other Findings:  None. IMPRESSION: There is occlusive thrombus and distention of the vessels from the left common femoral vein through the popliteal vein. Calf veins are patent. Electronically Signed   By: Rolm Baptise M.D.   On: 07/14/2018 20:50    ____________________________________________   PROCEDURES  Procedure(s) performed:   Procedures  None ____________________________________________   INITIAL IMPRESSION / ASSESSMENT AND PLAN / ED COURSE  Pertinent labs & imaging results that were available during my care of the patient  were reviewed by me and considered in my medical decision making (see chart for details).  Patient presents to the emergency department with atraumatic left leg swelling.  She has intact arterial pulses in the foot on the left.  Leg is diffusely swollen.  No chest pain/shortness of breath symptoms.   Patient with extensive clot in the left leg.  Discussed with vascular surgery who would not perform intervention on a 85 year old.  They recommend anticoagulation as indicated.  Plan for admission given the pain symptoms and large leg territory involved.  Lab work obtained with no acute findings.  Will start heparin and admit to the hospitalist for hypercoagulable work-up.  Discussed patient's case with Hospitalist, Dr.  Hal Hope to request admission. Patient and family (if present) updated with plan. Care transferred to [facility] service.  I reviewed all nursing notes, vitals, pertinent old records, EKGs, labs, imaging (as available).  ____________________________________________  FINAL CLINICAL IMPRESSION(S) / ED DIAGNOSES  Final diagnoses:  Acute deep vein thrombosis (DVT) of femoral vein of left lower extremity (HCC)     MEDICATIONS GIVEN DURING THIS VISIT:  Medications  acetaminophen (TYLENOL) tablet 650 mg (has no administration in time range)    Or  acetaminophen (TYLENOL) suppository 650 mg (has no administration in time range)  ondansetron (ZOFRAN) tablet 4 mg (has no administration in time range)    Or  ondansetron (ZOFRAN) injection 4 mg (has no administration in time range)  metoprolol tartrate (LOPRESSOR) tablet 50 mg (50 mg Oral Given 07/15/18 0912)  heparin ADULT infusion 100 units/mL (25000 units/273mL sodium chloride 0.45%) (800 Units/hr Intravenous Rate/Dose Change 07/15/18 0805)  heparin bolus via infusion 3,500 Units (3,500 Units Intravenous Bolus from Bag 07/14/18 2145)    Note:  This document was prepared using Dragon voice recognition software and may include unintentional dictation errors.  Nanda Quinton, MD Emergency Medicine    Shakaria Raphael, [facility], MD 07/15/18 [phone]

## 2018-07-14 NOTE — Patient Instructions (Signed)

## 2018-07-14 NOTE — ED Notes (Signed)
Patient transported to Ultrasound 

## 2018-07-14 NOTE — Progress Notes (Signed)
ANTICOAGULATION CONSULT NOTE - Initial Consult  Pharmacy Consult for heparin Indication: DVT  Allergies  Allergen Reactions  . Penicillins     REACTION: unspecified  . Pneumococcal Vaccine Polyvalent     REACTION: unspecified  . Tetanus-Diphtheria Toxoids Td     REACTION: unspecified    Patient Measurements: Height: 5\' 1"  (154.9 cm) Weight: 130 lb (59 kg) IBW/kg (Calculated) : 47.8 Heparin Dosing Weight: 59 kg  Vital Signs: Temp: 97.4 F (36.3 C) (02/24 1720) Temp Source: Oral (02/24 1720) BP: 155/71 (02/24 2029) Pulse Rate: 79 (02/24 2029)  Labs: No results for input(s): HGB, HCT, PLT, APTT, LABPROT, INR, HEPARINUNFRC, HEPRLOWMOCWT, CREATININE, CKTOTAL, CKMB, TROPONINI in the last 72 hours.  CrCl cannot be calculated (Patient's most recent lab result is older than the maximum 21 days allowed.).   Medical History: Past Medical History:  Diagnosis Date  . Adenomatous colon polyp    tubular  . Allergic rhinitis   . Diverticulosis   . GERD (gastroesophageal reflux disease)   . Hemorrhoids   . Hepatic cyst   . History of colonic polyps   . Hyperlipidemia   . Hypertension   . IBS (irritable bowel syndrome)   . Osteoarthritis    Assessment: 85 yo F presents with lower extremity swelling.  Goal of Therapy:  Heparin level 0.3-0.7 units/ml Monitor platelets by anticoagulation protocol: Yes   Plan:  Give heparin 3,500 unit bolus Start heparin gtt 1,000 units/hr Monitor daily heparin level, CBC, s/s of bleed   Gibbs Naugle J 07/14/2018,9:20 PM

## 2018-07-15 ENCOUNTER — Ambulatory Visit: Payer: Medicare Other | Admitting: Family Medicine

## 2018-07-15 ENCOUNTER — Encounter (HOSPITAL_COMMUNITY): Payer: Self-pay | Admitting: Internal Medicine

## 2018-07-15 DIAGNOSIS — I824Y2 Acute embolism and thrombosis of unspecified deep veins of left proximal lower extremity: Secondary | ICD-10-CM | POA: Diagnosis not present

## 2018-07-15 DIAGNOSIS — I82402 Acute embolism and thrombosis of unspecified deep veins of left lower extremity: Secondary | ICD-10-CM | POA: Diagnosis present

## 2018-07-15 LAB — CBC
HCT: 31.5 % — ABNORMAL LOW (ref 36.0–46.0)
Hemoglobin: 9.6 g/dL — ABNORMAL LOW (ref 12.0–15.0)
MCH: 29.4 pg (ref 26.0–34.0)
MCHC: 30.5 g/dL (ref 30.0–36.0)
MCV: 96.6 fL (ref 80.0–100.0)
Platelets: 215 10*3/uL (ref 150–400)
RBC: 3.26 MIL/uL — ABNORMAL LOW (ref 3.87–5.11)
RDW: 13.6 % (ref 11.5–15.5)
WBC: 6.1 10*3/uL (ref 4.0–10.5)
nRBC: 0 % (ref 0.0–0.2)

## 2018-07-15 LAB — BASIC METABOLIC PANEL
Anion gap: 4 — ABNORMAL LOW (ref 5–15)
BUN: 18 mg/dL (ref 8–23)
CO2: 25 mmol/L (ref 22–32)
Calcium: 8.5 mg/dL — ABNORMAL LOW (ref 8.9–10.3)
Chloride: 108 mmol/L (ref 98–111)
Creatinine, Ser: 0.74 mg/dL (ref 0.44–1.00)
GFR calc Af Amer: >60 mL/min (ref >60)
GFR calc non Af Amer: >60 mL/min (ref >60)
Glucose, Bld: 101 mg/dL — ABNORMAL HIGH (ref 70–99)
Potassium: 3.9 mmol/L (ref 3.5–5.1)
Sodium: 137 mmol/L (ref 135–145)

## 2018-07-15 LAB — HEPARIN LEVEL (UNFRACTIONATED): Heparin Unfractionated: 0.84 IU/mL — ABNORMAL HIGH (ref 0.30–0.70)

## 2018-07-15 MED ORDER — APIXABAN 5 MG PO TABS: 5.0000 mg | ORAL_TABLET | Freq: Two times a day (BID) | ORAL | Status: DC

## 2018-07-15 MED ORDER — ONDANSETRON HCL 4 MG PO TABS: 4.0000 mg | ORAL_TABLET | Freq: Four times a day (QID) | ORAL | Status: DC | PRN

## 2018-07-15 MED ORDER — ACETAMINOPHEN 325 MG PO TABS
650.0000 mg | ORAL_TABLET | Freq: Four times a day (QID) | ORAL | Status: DC | PRN
  Administered 2018-07-17: 650 mg via ORAL
  Filled 2018-07-15 (×2): qty 2

## 2018-07-15 MED ORDER — AMLODIPINE BESYLATE 5 MG PO TABS
5.0000 mg | ORAL_TABLET | Freq: Every day | ORAL | Status: DC
  Administered 2018-07-15 – 2018-07-17 (×3): 5 mg via ORAL
  Filled 2018-07-15 (×3): qty 1

## 2018-07-15 MED ORDER — APIXABAN 5 MG PO TABS
10.0000 mg | ORAL_TABLET | Freq: Two times a day (BID) | ORAL | Status: DC
  Administered 2018-07-15 – 2018-07-17 (×5): 10 mg via ORAL
  Filled 2018-07-15 (×5): qty 2

## 2018-07-15 MED ORDER — ACETAMINOPHEN 325 MG PO TABS: 650.0000 mg | ORAL_TABLET | Freq: Four times a day (QID) | ORAL | Status: DC | PRN

## 2018-07-15 MED ORDER — ONDANSETRON HCL 4 MG/2ML IJ SOLN: 4.0000 mg | Freq: Four times a day (QID) | INTRAMUSCULAR | Status: DC | PRN

## 2018-07-15 MED ORDER — METOPROLOL TARTRATE 50 MG PO TABS
50.0000 mg | ORAL_TABLET | Freq: Every day | ORAL | Status: DC
  Administered 2018-07-15 – 2018-07-17 (×3): 50 mg via ORAL
  Filled 2018-07-15 (×3): qty 1

## 2018-07-15 MED ORDER — HALOPERIDOL LACTATE 5 MG/ML IJ SOLN
1.0000 mg | Freq: Once | INTRAMUSCULAR | Status: AC
  Administered 2018-07-15: 1 mg via INTRAVENOUS
  Filled 2018-07-15: qty 1

## 2018-07-15 MED ORDER — ACETAMINOPHEN 650 MG RE SUPP: 650.0000 mg | Freq: Four times a day (QID) | RECTAL | Status: DC | PRN

## 2018-07-15 MED ORDER — HYDRALAZINE HCL 20 MG/ML IJ SOLN: 5.0000 mg | Freq: Four times a day (QID) | INTRAMUSCULAR | Status: DC | PRN

## 2018-07-15 MED ORDER — HEPARIN (PORCINE) 25000 UT/250ML-% IV SOLN: 800.0000 [IU]/h | INTRAVENOUS | Status: DC

## 2018-07-15 NOTE — Progress Notes (Signed)
Per patient's family, patient did have a fall prior to admission about 1 week ago when she was getting out of bed and felt "whoozy" according to patient's son. He stated she rolled to the dresser and used it to help stand up. Patient now a high fall risk and floor mats ordered. Patient does live at home alone and has 3 children who live in [place]. All 3 children are concerned about her being discharged back home for safety issues regarding mobility and because of her needing to take Eliquis BID and potential for forgetting to take the drug due to patient's memory impairment.  Patient's children are hoping for patient to be discharged to rehab. RN informed family that physical therapy would be working with her and will follow up regarding their recommendations.

## 2018-07-15 NOTE — Progress Notes (Signed)
PT Cancellation Note  Patient Details Name: Shirley Wright MRN: [id number] DOB: [birth year]   Cancelled Treatment:    Reason Eval/Treat Not Completed: Medical issues which prohibited therapy.  Newly anticoagulated LLE, will try later to see how she is progressing due to having just been admitted.   Ramond Dial 07/15/2018, 9:26 AM  Mee Hives, PT MS Acute Rehab Dept. Number: [place] and [place]

## 2018-07-15 NOTE — Progress Notes (Signed)
Patient ID: Shirley Wright, female   DOB: Jan 07, 1926, 85 y.o.   MRN: [id number] Patient was admitted early this morning for left lower extremity swelling and started on heparin drip.  Patient seen and examined at bedside.  She is pleasantly confused.  I have reviewed this morning's H&P and prior medical records myself.  I have spoken to Dr. Lissa Morales surgery on phone and he recommends the patient can be switched to oral anticoagulation.  Spoke to [employer] on phone and she is okay for the patient to be started on oral anticoagulant.  Will start Eliquis.  Will get physical therapy evaluation.  Repeat a.m. labs.

## 2018-07-15 NOTE — H&P (Signed)
History and Physical    Shirley Wright GYK:[id number] DOB: 06/18/1925 DOA: 07/14/2018  PCP: Laurey Morale, MD   Patient coming from: Home.  Chief Complaint: Left lower extremity swelling.  HPI: Shirley Wright is a 85 y.o. female with history of hypertension dementia was brought to the ER after patient had increasing swelling of the left lower extremity over the last few days.  Denies any trauma or fall.  Denies any chest pain or shortness of breath.  ED Course: In the ER Dopplers revealed occlusive thrombus of left common femoral vein through popliteal vein.  Calf veins are patent.  Given the significant swelling ER physician discussed with on-call vascular surgeon Dr. Oneida Alar who at this time advised heparin and closely observe.  Review of Systems: As per HPI, rest all negative.   Past Medical History:  Diagnosis Date  . Adenomatous colon polyp    tubular  . Allergic rhinitis   . Diverticulosis   . GERD (gastroesophageal reflux disease)   . Hemorrhoids   . Hepatic cyst   . History of colonic polyps   . Hyperlipidemia   . Hypertension   . IBS (irritable bowel syndrome)   . Osteoarthritis     Past Surgical History:  Procedure Laterality Date  . benign breast biopsy    . COLONOSCOPY  06 49 11   Dr Earlean Shawl,  benign polyp, diverticulosis, no repeats recommended  . ganglion cyst removed     left thumba  . LUMBAR FUSION     L3-4 and L4-5 per Dr. Carloyn Manner 2002  . TONSILLECTOMY    . VAGINAL HYSTERECTOMY     with ovaries intact     reports that she has never smoked. She has never used smokeless tobacco. She reports that she does not drink alcohol or use drugs.  Allergies  Allergen Reactions  . Penicillins     REACTION: unspecified  . Pneumococcal Vaccine Polyvalent     REACTION: unspecified  . Tetanus-Diphtheria Toxoids Td     REACTION: unspecified    Family History  Problem Relation Age of Onset  . Arthritis Other   . Diabetes Other   . Hypertension Other   .  Lung cancer Other   . Heart disease Other     Prior to Admission medications   Medication Sig Start Date End Date Taking? Authorizing Provider  HYDROcodone-acetaminophen (NORCO/VICODIN) 5-325 MG tablet Take 1 tablet by mouth every 6 (six) hours as needed for moderate pain. 04/25/17  Yes Laurey Morale, MD  metoprolol tartrate (LOPRESSOR) 50 MG tablet TAKE 1 BY MOUTH DAILY 05/15/18  Yes Laurey Morale, MD  potassium chloride (KLOR-CON 10) 10 MEQ tablet Take 1 tablet (10 mEq total) by mouth daily. 05/15/18  Yes Laurey Morale, MD  vitamin B-12 (CYANOCOBALAMIN) 1000 MCG tablet Take 1,000 mcg by mouth daily.   Yes [provider]    Physical Exam: Vitals:   07/14/18 2029 07/14/18 2130 07/14/18 2251 07/14/18 2300  BP: (!) 155/71 (!) 150/90 (!) 162/73   Pulse: 79 77 78   Resp: 16 15 17    Temp:   98 F (36.7 C)   TempSrc:   Oral   SpO2: 97% 99% 97%   Weight:    65 kg  Height:    5\' 1"  (1.549 m)      Constitutional: Moderately built and nourished. Vitals:   07/14/18 2029 07/14/18 2130 07/14/18 2251 07/14/18 2300  BP: (!) 155/71 (!) 150/90 (!) 162/73   Pulse:  79 77 78   Resp: 16 15 17    Temp:   98 F (36.7 C)   TempSrc:   Oral   SpO2: 97% 99% 97%   Weight:    65 kg  Height:    5\' 1"  (1.549 m)   Eyes: Anicteric no pallor. ENMT: No discharge from the ears eyes nose and mouth. Neck: No mass felt.  No neck rigidity. Respiratory: No rhonchi or crepitations. Cardiovascular: S1-S2 heard. Abdomen: Soft nontender bowel sounds present. Musculoskeletal: Left lower extremity swelling from the calf to thigh. Skin: Chronic skin changes. Neurologic: Alert awake oriented to name and place.  Moves all extremities. Psychiatric: Oriented to name and place.   Labs on Admission: I have personally reviewed following labs and imaging studies  CBC: Recent Labs  Lab 07/14/18 2123  WBC 7.2  NEUTROABS 4.1  HGB 11.3*  HCT 36.2  MCV 93.8  PLT 161   Basic Metabolic Panel: Recent  Labs  Lab 07/14/18 2123  NA 133*  K 3.5  CL 103  CO2 24  GLUCOSE 103*  BUN 20  CREATININE 0.82  CALCIUM 8.9   GFR: Estimated Creatinine Clearance: 39.4 mL/min (by C-G formula based on SCr of 0.82 mg/dL). Liver Function Tests: Recent Labs  Lab 07/14/18 2123  AST 19  ALT 13  ALKPHOS 74  BILITOT 0.4  PROT 6.9  ALBUMIN 3.1*   No results for input(s): LIPASE, AMYLASE in the last 168 hours. No results for input(s): AMMONIA in the last 168 hours. Coagulation Profile: Recent Labs  Lab 07/14/18 2123  INR 1.1   Cardiac Enzymes: No results for input(s): CKTOTAL, CKMB, CKMBINDEX, TROPONINI in the last 168 hours. BNP (last 3 results) No results for input(s): PROBNP in the last 8760 hours. HbA1C: No results for input(s): HGBA1C in the last 72 hours. CBG: No results for input(s): GLUCAP in the last 168 hours. Lipid Profile: No results for input(s): CHOL, HDL, LDLCALC, TRIG, CHOLHDL, LDLDIRECT in the last 72 hours. Thyroid Function Tests: No results for input(s): TSH, T4TOTAL, FREET4, T3FREE, THYROIDAB in the last 72 hours. Anemia Panel: No results for input(s): VITAMINB12, FOLATE, FERRITIN, TIBC, IRON, RETICCTPCT in the last 72 hours. Urine analysis:    Component Value Date/Time   COLORURINE yellow 01/17/2009 0942   APPEARANCEUR Clear 01/17/2009 0942   LABSPEC 1.015 01/17/2009 0942   PHURINE 7.0 01/17/2009 0942   HGBUR negative 01/17/2009 0942   BILIRUBINUR n 05/15/2018 1209   PROTEINUR Negative 05/15/2018 1209   UROBILINOGEN 0.2 05/15/2018 1209   UROBILINOGEN 0.2 01/17/2009 0942   NITRITE n 05/15/2018 1209   NITRITE negative 01/17/2009 0942   LEUKOCYTESUR Negative 05/15/2018 1209   Sepsis Labs: @LABRCNTIP (procalcitonin:4,lacticidven:4) )No results found for this or any previous visit (from the past 240 hour(s)).   Radiological Exams on Admission: US Venous Img Lower  Left (dvt Study)  Result Date: 07/14/2018 CLINICAL DATA:  Pain, swelling in left lower  extremity EXAM: LEFT LOWER EXTREMITY VENOUS DOPPLER ULTRASOUND TECHNIQUE: Gray-scale sonography with graded compression, as well as color Doppler and duplex ultrasound were performed to evaluate the lower extremity deep venous systems from the level of the common femoral vein and including the common femoral, femoral, profunda femoral, popliteal and calf veins including the posterior tibial, peroneal and gastrocnemius veins when visible. The superficial great saphenous vein was also interrogated. Spectral Doppler was utilized to evaluate flow at rest and with distal augmentation maneuvers in the common femoral, femoral and popliteal veins. COMPARISON:  None. FINDINGS: Contralateral Common Femoral  Vein: Respiratory phasicity is normal and symmetric with the symptomatic side. No evidence of thrombus. Normal compressibility. Common Femoral Vein: Occlusive thrombus in the common femoral vein. Saphenofemoral Junction: Appears occluded. Profunda Femoral Vein: Occlusive thrombus. Femoral Vein: Occlusive thrombus. Popliteal Vein: Occlusive thrombus. Calf Veins: No evidence of thrombus. Normal compressibility and flow on color Doppler imaging. Superficial Great Saphenous Vein: Not well visualized. Venous Reflux:  None. Other Findings:  None. IMPRESSION: There is occlusive thrombus and distention of the vessels from the left common femoral vein through the popliteal vein. Calf veins are patent. Electronically Signed   By: Rolm Baptise M.D.   On: 07/14/2018 20:50     Assessment/Plan Principal Problem:   DVT (deep venous thrombosis) (HCC) Active Problems:   Essential hypertension   Acute deep vein thrombosis (DVT) of left lower extremity ([place])    1. Left lower extremity DVT -on heparin.  Has extensive DVT.  Closely observe any further worsening of edema or skin changes. 2. Hypertension on metoprolol. 3. Anemia appears to be new follow CBC. 4. History of dementia no acute issues.   DVT prophylaxis:  Heparin. Code Status: Full code. Family Communication: Discussed with patient. Disposition Plan: Home. Consults called: ER physician discussed with vascular surgeon. Admission status: Observation.   Rise Patience MD [facility] Pager [phone].  If 7PM-7AM, please contact night-coverage [email or web address] Password [email or web address]  07/15/2018, 1:45 AM

## 2018-07-15 NOTE — Care Management Note (Signed)
Case Management Note  Patient Details  Name: Shirley Wright MRN: [id number] Date of Birth: 1926/03/22  Subjective/Objective:                    Action/Plan:   Expected Discharge Date:  07/16/18               Expected Discharge Plan:     In-House Referral:     Discharge planning Services     Post Acute Care Choice:    Choice offered to:     DME Arranged:    DME Agency:     HH Arranged:    HH Agency:     Status of Service:     If discussed at [employer] of Stay Meetings, dates discussed:    Additional Comments: Per [facility] [facility] [phone] Eliquis 10mg  2x day is $37.00 ofr 30day supply requires prior auth [id number] opt. 3 then opt.2  Kerin Salen 07/15/2018, 12:08 PM

## 2018-07-15 NOTE — Progress Notes (Signed)
[place] for heparin Indication: DVT  Allergies  Allergen Reactions  . Penicillins     Unspecified Did it involve swelling of the face/tongue/throat, SOB, or low BP? Unknown Did it involve sudden or severe rash/hives, skin peeling, or any reaction on the inside of your mouth or nose? Unknown Did you need to seek medical attention at a hospital or doctor's office? Unknown When did it last happen?unknown If all above answers are "NO", may proceed with cephalosporin use.   . Pneumococcal Vaccine Polyvalent Other (See Comments)    unspecified  . Tetanus-Diphtheria Toxoids Td Other (See Comments)     unspecified   Patient Measurements: Height: 5\' 1"  (154.9 cm) Weight: 143 lb 4.8 oz (65 kg) IBW/kg (Calculated) : 47.8 Heparin Dosing Weight: 59 kg  Vital Signs: Temp: 98.1 F (36.7 C) (02/25 0500) Temp Source: Oral (02/25 0500) BP: 148/70 (02/25 0500) Pulse Rate: 74 (02/25 0500)  Labs: Recent Labs    07/14/18 2123 07/15/18 0436 07/15/18 0446  HGB 11.3* 9.6*  --   HCT 36.2 31.5*  --   PLT 224 215  --   LABPROT 13.9  --   --   INR 1.1  --   --   HEPARINUNFRC  --   --  0.84*  CREATININE 0.82 0.74  --    Estimated Creatinine Clearance: 40.4 mL/min (by C-G formula based on SCr of 0.74 mg/dL).  Medical History: Past Medical History:  Diagnosis Date  . Adenomatous colon polyp    tubular  . Allergic rhinitis   . Diverticulosis   . GERD (gastroesophageal reflux disease)   . Hemorrhoids   . Hepatic cyst   . History of colonic polyps   . Hyperlipidemia   . Hypertension   . IBS (irritable bowel syndrome)   . Osteoarthritis    Assessment: 85 yo F [facility] to [facility] with lower extremity swelling, occlusive thrombus of left common femoral vein through popliteal vein per Doppler.  Baseline CBC: Hgb 11.3/ Hct 36.2, Plt 224, no INR elevation  3500 unit bolus, infusion at 1000 units/hr  Goal of Therapy:  Heparin level 0.3-0.7  units/ml Monitor platelets by anticoagulation protocol: Yes   Today, 07/15/2018 0446 Hep level = 0.84 units/ml, H/H sl decr, Plt unchanged   Plan:   Reduce Heparin to 8000 units/hr  Recheck Hep level at 1600  Daily  CBC, daily Hep level when at steady state  Minda Ditto PharmD Pager [phone] 07/15/2018, 7:45 AM

## 2018-07-15 NOTE — Discharge Instructions (Signed)
Information on my medicine - ELIQUIS (apixaban)  This medication education was reviewed with me or my healthcare representative as part of my discharge preparation.  The pharmacist that spoke with me during my hospital stay was:  Minda Ditto, [facility]  Why was Eliquis prescribed for you? Eliquis was prescribed to treat blood clots that may have been found in the veins of your legs (deep vein thrombosis) or in your lungs (pulmonary embolism) and to reduce the risk of them occurring again.  What do You need to know about Eliquis ? The starting dose is 10 mg (two 5 mg tablets) taken TWICE daily for the FIRST SEVEN (7) DAYS, then on (enter date)  07/22/2018  the dose is reduced to ONE 5 mg tablet taken TWICE daily.  Eliquis may be taken with or without food.   Try to take the dose about the same time in the morning and in the evening. If you have difficulty swallowing the tablet whole please discuss with your pharmacist how to take the medication safely.  Take Eliquis exactly as prescribed and DO NOT stop taking Eliquis without talking to the doctor who prescribed the medication.  Stopping may increase your risk of developing a new blood clot.  Refill your prescription before you run out.  After discharge, you should have regular check-up appointments with your healthcare provider that is prescribing your Eliquis.    What do you do if you miss a dose? If a dose of ELIQUIS is not taken at the scheduled time, take it as soon as possible on the same day and twice-daily administration should be resumed. The dose should not be doubled to make up for a missed dose.  Important Safety Information A possible side effect of Eliquis is bleeding. You should call your healthcare provider right away if you experience any of the following: ? Bleeding from an injury or your nose that does not stop. ? Unusual colored urine (red or dark brown) or unusual colored stools (red or black). ? Unusual bruising for  unknown reasons. ? A serious fall or if you hit your head (even if there is no bleeding).  Some medicines may interact with Eliquis and might increase your risk of bleeding or clotting while on Eliquis. To help avoid this, consult your healthcare provider or pharmacist prior to using any new prescription or non-prescription medications, including herbals, vitamins, non-steroidal anti-inflammatory drugs (NSAIDs) and supplements.  This website has more information on Eliquis (apixaban): http://www.eliquis.com/eliquis/home

## 2018-07-16 DIAGNOSIS — I82412 Acute embolism and thrombosis of left femoral vein: Secondary | ICD-10-CM | POA: Diagnosis not present

## 2018-07-16 DIAGNOSIS — I10 Essential (primary) hypertension: Secondary | ICD-10-CM

## 2018-07-16 DIAGNOSIS — I824Y2 Acute embolism and thrombosis of unspecified deep veins of left proximal lower extremity: Secondary | ICD-10-CM | POA: Diagnosis not present

## 2018-07-16 LAB — BASIC METABOLIC PANEL
ANION GAP: 5 (ref 5–15)
BUN: 12 mg/dL (ref 8–23)
CO2: 25 mmol/L (ref 22–32)
Calcium: 8.8 mg/dL — ABNORMAL LOW (ref 8.9–10.3)
Chloride: 109 mmol/L (ref 98–111)
Creatinine, Ser: 0.63 mg/dL (ref 0.44–1.00)
GFR calc Af Amer: >60 mL/min (ref >60)
GFR calc non Af Amer: >60 mL/min (ref >60)
Glucose, Bld: 97 mg/dL (ref 70–99)
POTASSIUM: 3.9 mmol/L (ref 3.5–5.1)
Sodium: 139 mmol/L (ref 135–145)

## 2018-07-16 LAB — MAGNESIUM: Magnesium: 2.1 mg/dL (ref 1.7–2.4)

## 2018-07-16 MED ORDER — LIP MEDEX EX OINT
TOPICAL_OINTMENT | CUTANEOUS | Status: DC | PRN
  Filled 2018-07-16: qty 7

## 2018-07-16 NOTE — NC FL2 (Addendum)
[place] MEDICAID FL2 LEVEL OF CARE SCREENING TOOL     IDENTIFICATION  Patient Name: Shirley Wright Birthdate: [birth year] Sex: female Admission Date (Current Location): 07/14/2018  [facility] and [place] Number:  Herbalist and Address:  [facility],  [address], [place]      Provider Number: [id number]  Attending Physician Name and Address:  Donne Chamaine, MD  Relative Name and Phone Number:       Current Level of Care: Hospital Recommended Level of Care: [place] Prior Approval Number:    Date Approved/Denied:   PASRR Number:    Discharge Plan: SNF    Current Diagnoses: Patient Active Problem List   Diagnosis Date Noted  . Acute deep vein thrombosis (DVT) of left lower extremity ([place]) 07/15/2018  . DVT (deep venous thrombosis) ([place]) 07/14/2018  . Onychomycosis 02/20/2018  . Memory loss 02/20/2018  . GERD 04/16/2007  . IRRITABLE BOWEL SYNDROME 04/16/2007  . Osteoarthritis 04/16/2007  . BACK PAIN, LUMBAR 04/16/2007  . COLONIC POLYPS, HX OF 04/16/2007  . Dyslipidemia 01/27/2007  . Essential hypertension 01/27/2007  . ALLERGIC RHINITIS 01/27/2007    Orientation RESPIRATION BLADDER Height & Weight     Self    Continent Weight: 143 lb 4.8 oz (65 kg) Height:  5\' 1"  (154.9 cm)  BEHAVIORAL SYMPTOMS/MOOD NEUROLOGICAL BOWEL NUTRITION STATUS      Continent    AMBULATORY STATUS COMMUNICATION OF NEEDS Skin   Extensive Assist Verbally                         Personal Care Assistance Level of Assistance  Bathing, Dressing, Feeding Bathing Assistance: Limited assistance Feeding assistance: Independent Dressing Assistance: Limited assistance     Functional Limitations Info  Sight, Hearing, Speech Sight Info: Impaired Hearing Info: Adequate Speech Info: Adequate    SPECIAL CARE FACTORS FREQUENCY  PT (By licensed PT), OT (By licensed OT)     PT Frequency: 5x/week OT Frequency: 5x/week             Contractures Contractures Info: Not present    Additional Factors Info  Code Status, Allergies Code Status Info: Fullcode Allergies Info: Allergies: Penicillins, Pneumococcal Vaccine Polyvalent, Tetanus-diphtheria Toxoids Td           Current Medications (07/16/2018):  This is the current hospital active medication list Current Facility-Administered Medications  Medication Dose Route Frequency Provider Last Rate Last Dose  . acetaminophen (TYLENOL) tablet 650 mg  650 mg Oral Q6H PRN Aline August, MD       Or  . acetaminophen (TYLENOL) suppository 650 mg  650 mg Rectal Q6H PRN Starla Link, Kshitiz, MD      . amLODipine (NORVASC) tablet 5 mg  5 mg Oral Daily Alekh, Kshitiz, MD   5 mg at 07/16/18 1111  . apixaban (ELIQUIS) tablet 10 mg  10 mg Oral BID Minda Ditto, RPH   10 mg at 07/16/18 1112   Followed by  . [START ON 07/22/2018] apixaban (ELIQUIS) tablet 5 mg  5 mg Oral BID Green, Terri L, RPH      . hydrALAZINE (APRESOLINE) injection 5 mg  5 mg Intravenous Q6H PRN Alekh, Kshitiz, MD      . lip balm (CARMEX) ointment   Topical PRN Donne Claudetta, MD      . metoprolol tartrate (LOPRESSOR) tablet 50 mg  50 mg Oral Daily Rise Patience, MD   50 mg at 07/16/18 1112  .  ondansetron (ZOFRAN) tablet 4 mg  4 mg Oral Q6H PRN Rise Patience, MD       Or  . ondansetron [facility]) injection 4 mg  4 mg Intravenous Q6H PRN Rise Patience, MD         Discharge Medications: Please see discharge summary for a list of discharge medications.  Relevant Imaging Results:  Relevant Lab Results:   Additional Information ssn: [id number]  Lia Hopping, LCSW

## 2018-07-16 NOTE — Clinical Social Work Note (Signed)
Clinical Social Work Assessment  Patient Details  Name: Shirley Wright MRN: [id number] Date of Birth: July 09, 1925  Date of referral:  07/16/18               Reason for consult:  Discharge Planning, Facility Placement                Permission sought to share information with:    Permission granted to share information::  Yes, Verbal Permission Granted  Name::       Pace,Debra     Agency::     Relationship::   Daughter   Contact Information:    [phone]  Housing/Transportation Living arrangements for the past 2 months:  Single Family Home Source of Information:  Power of [place], Adult Children Patient Interpreter Needed:  None Criminal Activity/Legal Involvement Pertinent to Current Situation/Hospitalization:  No - Comment as needed Significant Relationships:  Adult Children Lives with:  Self Do you feel safe going back to the place where you live?  Yes Need for family participation in patient care:  Yes   Care giving concerns:   Shirley Wright is a 85 y.o. female with history of hypertension dementia was brought to the ER after patient had increasing swelling of the left lower extremity over the last few days.  Denies any trauma or fall.  Denies any chest pain or shortness of breath.  Social Worker assessment / plan:  CSW discussed discharge planning to SNF with the patient daughter/ [employer]. Patient express she is concern about the patient going home alone and having weakness. She reports the patient has memory issues and often times is very forgetful. Patient family has been supportive in her care.  CSW explain SNF process/ insurance authorization process and will follow up with bed offers in the am.  fl2 complete Clinical information has been sent to: BCBS Medicare  Plan: SNF  Employment status:  Retired Forensic scientist:  Medicare PT Recommendations:  [place] / Referral to community resources:  [place]  Patient/Family's Response to care:  Agreeable and Responding well.   Patient/Family's Understanding of and Emotional Response to Diagnosis, Current Treatment, and Prognosis:  Patient daughter has a good understanding of her diagnosis and current treatment.   Emotional Assessment Appearance:  Appears stated age Attitude/Demeanor/Rapport:    Affect (typically observed):  Accepting, Calm Orientation:  Oriented to Self, Oriented to Place, Oriented to  Time, Oriented to Situation Alcohol / Substance use:  Not Applicable Psych involvement (Current and /or in the community):  No (Comment)  Discharge Needs  Concerns to be addressed:  Discharge Planning Concerns Readmission within the last 30 days:  No Current discharge risk:  Dependent with Mobility Barriers to Discharge:  Continued Medical Work up, [place], LCSW 07/16/2018, 3:10 PM

## 2018-07-16 NOTE — Plan of Care (Signed)
  Problem: Clinical Measurements: Goal: Diagnostic test results will improve Outcome: Progressing Goal: Respiratory complications will improve Outcome: Progressing   Problem: Activity: Goal: Risk for activity intolerance will decrease Outcome: Progressing   Problem: Safety: Goal: Ability to remain free from injury will improve Outcome: Progressing   

## 2018-07-16 NOTE — Progress Notes (Signed)
PROGRESS NOTE    Shirley Wright  VOZ:[id number] DOB: 1925/07/12 DOA: 07/14/2018 PCP: Laurey Morale, MD    Brief Narrative:  85 y.o. female with history of hypertension dementia was brought to the ER after patient had increasing swelling of the left lower extremity over the last few days.  Denies any trauma or fall.  Denies any chest pain or shortness of breath.  ED Course: In the ER Dopplers revealed occlusive thrombus of left common femoral vein through popliteal vein.  Calf veins are patent.  Given the significant swelling ER physician discussed with on-call vascular surgeon Dr. Oneida Alar who at this time advised heparin and closely observe.  Assessment & Plan:   Principal Problem:   DVT (deep venous thrombosis) (HCC) Active Problems:   Essential hypertension   Acute deep vein thrombosis (DVT) of left lower extremity (HCC)   1. Left lower extremity DVT 1. Initially on heparin gtt 2. Case earlier discussed with Vascular Surgery who recommended continued therapeutic anticoagulation 3. Pt has since been transitioned to PO eliquis, anticipate total 3-6 months tx 2. Hypertension on metoprolol. 1. Stable at this time 3. Anemia, acute 1. No evidence of acute blood loss at this time 2. Repeat CBC in AM 4. History of dementia with acute delirium 1. Currently stable however overnight, noted to be acutely delirious requiring mit restraints  DVT prophylaxis: Eliquis Code Status: Full Family Communication: Pt in room, family at bedside Disposition Plan: SNF [facility] in 24hrs  Consultants:     Procedures:     Antimicrobials: Anti-infectives (From admission, onward)   None       Subjective: Without complaints this AM, mildly confused  Objective: Vitals:   07/16/18 1038 07/16/18 1103 07/16/18 1106 07/16/18 1425  BP: (!) 141/68 (!) 151/70 129/64 139/63  Pulse: 69 71 77 64  Resp: 16 (!) 22  16  Temp: (!) 97.5 F (36.4 C) 97.6 F (36.4 C)  98 F (36.7 C)  TempSrc:  Oral Oral  Oral  SpO2: 95% 97% 100% 96%  Weight:      Height:        Intake/Output Summary (Last 24 hours) at 07/16/2018 1648 Last data filed at 07/16/2018 1400 Gross per 24 hour  Intake 480 ml  Output -  Net 480 ml   Filed Weights   07/14/18 1721 07/14/18 2300  Weight: 59 kg 65 kg    Examination:  General exam: Appears calm and comfortable  Respiratory system: Clear to auscultation. Respiratory effort normal. Cardiovascular system: S1 & S2 heard, RRR. Gastrointestinal system: Abdomen is nondistended, soft and nontender. No organomegaly or masses felt. Normal bowel sounds heard. Central nervous system: Mildly confused. No focal neurological deficits. Extremities: Symmetric 5 x 5 power. Skin: No rashes, lesions  Psychiatry: mood appears normal, affect appears intact  Data Reviewed: I have personally reviewed following labs and imaging studies  CBC: Recent Labs  Lab 07/14/18 2123 07/15/18 0436  WBC 7.2 6.1  NEUTROABS 4.1  --   HGB 11.3* 9.6*  HCT 36.2 31.5*  MCV 93.8 96.6  PLT 224 425   Basic Metabolic Panel: Recent Labs  Lab 07/14/18 2123 07/15/18 0436 07/16/18 0453  NA 133* 137 139  K 3.5 3.9 3.9  CL 103 108 109  CO2 24 25 25   GLUCOSE 103* 101* 97  BUN 20 18 12   CREATININE 0.82 0.74 0.63  CALCIUM 8.9 8.5* 8.8*  MG  --   --  2.1   GFR: Estimated Creatinine Clearance: 40.4 mL/min (by  C-G formula based on SCr of 0.63 mg/dL). Liver Function Tests: Recent Labs  Lab 07/14/18 2123  AST 19  ALT 13  ALKPHOS 74  BILITOT 0.4  PROT 6.9  ALBUMIN 3.1*   No results for input(s): LIPASE, AMYLASE in the last 168 hours. No results for input(s): AMMONIA in the last 168 hours. Coagulation Profile: Recent Labs  Lab 07/14/18 2123  INR 1.1   Cardiac Enzymes: No results for input(s): CKTOTAL, CKMB, CKMBINDEX, TROPONINI in the last 168 hours. BNP (last 3 results) No results for input(s): PROBNP in the last 8760 hours. HbA1C: No results for input(s): HGBA1C in  the last 72 hours. CBG: No results for input(s): GLUCAP in the last 168 hours. Lipid Profile: No results for input(s): CHOL, HDL, LDLCALC, TRIG, CHOLHDL, LDLDIRECT in the last 72 hours. Thyroid Function Tests: No results for input(s): TSH, T4TOTAL, FREET4, T3FREE, THYROIDAB in the last 72 hours. Anemia Panel: No results for input(s): VITAMINB12, FOLATE, FERRITIN, TIBC, IRON, RETICCTPCT in the last 72 hours. Sepsis Labs: No results for input(s): PROCALCITON, LATICACIDVEN in the last 168 hours.  No results found for this or any previous visit (from the past 240 hour(s)).   Radiology Studies: US Venous Img Lower  Left (dvt Study)  Result Date: 07/14/2018 CLINICAL DATA:  Pain, swelling in left lower extremity EXAM: LEFT LOWER EXTREMITY VENOUS DOPPLER ULTRASOUND TECHNIQUE: Gray-scale sonography with graded compression, as well as color Doppler and duplex ultrasound were performed to evaluate the lower extremity deep venous systems from the level of the common femoral vein and including the common femoral, femoral, profunda femoral, popliteal and calf veins including the posterior tibial, peroneal and gastrocnemius veins when visible. The superficial great saphenous vein was also interrogated. Spectral Doppler was utilized to evaluate flow at rest and with distal augmentation maneuvers in the common femoral, femoral and popliteal veins. COMPARISON:  None. FINDINGS: Contralateral Common Femoral Vein: Respiratory phasicity is normal and symmetric with the symptomatic side. No evidence of thrombus. Normal compressibility. Common Femoral Vein: Occlusive thrombus in the common femoral vein. Saphenofemoral Junction: Appears occluded. Profunda Femoral Vein: Occlusive thrombus. Femoral Vein: Occlusive thrombus. Popliteal Vein: Occlusive thrombus. Calf Veins: No evidence of thrombus. Normal compressibility and flow on color Doppler imaging. Superficial Great Saphenous Vein: Not well visualized. Venous Reflux:   None. Other Findings:  None. IMPRESSION: There is occlusive thrombus and distention of the vessels from the left common femoral vein through the popliteal vein. Calf veins are patent. Electronically Signed   By: Rolm Baptise M.D.   On: 07/14/2018 20:50    Scheduled Meds: . amLODipine  5 mg Oral Daily  . apixaban  10 mg Oral BID   Followed by  . [START ON 07/22/2018] apixaban  5 mg Oral BID  . metoprolol tartrate  50 mg Oral Daily   Continuous Infusions:   LOS: 0 days   Marylu Lund, MD [facility] Pager On Amion  If 7PM-7AM, please contact night-coverage 07/16/2018, 4:48 PM

## 2018-07-16 NOTE — Evaluation (Signed)
Physical Therapy Evaluation Patient Details Name: Shirley Wright MRN: [id number] DOB: 05/31/1925 Today's Date: 07/16/2018   History of Present Illness  85 yo female with onset of DVT on LLE in L common femoral vein through the popliteal vein, has sustained a recent fall and had noticeable edema on LLE over the time preceding admission.  PT referred to manage her mobility changes and work on safety.  PMHx:  colon polyp, diverticulosis, GERD, hepatic cyst, HTN, IBS, OA, lumbar fusion,   Clinical Impression  Pt was seen for determining mobility independence for home vs SNF care.  Noted most worrisome element was her tendency to abandon the walker and lists to L side without a clear sign of protective extension.  Pt followed instructions but did not retain all the information about safety in her visit.  Pt is requiring enough management of mobility to require SNF stay for increasing her independence, but may be a better ALF candidate afterward rather than a return to home.  Family is going to make this determination of plan with SW to assist evaluating her needs, as well as with MD.    Follow Up Recommendations SNF    Equipment Recommendations  None recommended by PT    Recommendations for Other Services       Precautions / Restrictions Precautions Precautions: Fall(Telemetry) Restrictions Weight Bearing Restrictions: No      Mobility  Bed Mobility Overal bed mobility: Needs Assistance Bed Mobility: Supine to Sit;Sit to Supine     Supine to sit: Min assist Sit to supine: Min guard   General bed mobility comments: pt is slow to move and requires cues for all hand placements  Transfers Overall transfer level: Needs assistance Equipment used: Rolling walker (2 wheeled);1 person hand held assist Transfers: Sit to/from Stand Sit to Stand: Min guard         General transfer comment: min guard for safety and cues for hand placement every trial  Ambulation/Gait Ambulation/Gait  assistance: Min guard;Min assist Gait Distance (Feet): 60 Feet(30 x 2) Assistive device: Rolling walker (2 wheeled);1 person hand held assist Gait Pattern/deviations: Step-to pattern;Step-through pattern;Decreased stride length;Wide base of support;Staggering left Gait velocity: reduced Gait velocity interpretation: <1.31 ft/sec, indicative of household ambulator General Gait Details: lists to L side every walk and requires min assist to correct balance  Stairs            Wheelchair Mobility    Modified Rankin (Stroke Patients Only)       Balance Overall balance assessment: Needs assistance;History of Falls Sitting-balance support: Bilateral upper extremity supported;Feet supported Sitting balance-Leahy Scale: Fair   Postural control: Left lateral lean Standing balance support: Bilateral upper extremity supported;During functional activity Standing balance-Leahy Scale: Fair Standing balance comment: less than fair dynamically                             Pertinent Vitals/Pain Pain Assessment: No/denies pain    Home Living Family/patient expects to be discharged to:: Skilled nursing facility Living Arrangements: Alone Available Help at Discharge: Family;Available PRN/intermittently Type of Home: House Home Access: Stairs to enter Entrance Stairs-Rails: Left Entrance Stairs-Number of Steps: 6 Home Layout: One level Home Equipment: Walker - 4 wheels;Walker - 2 wheels;Cane - single point      Prior Function Level of Independence: Needs assistance   Gait / Transfers Assistance Needed: RW at home but uses [facility]  ADL's / Homemaking Assistance Needed: family comes with food and to  clean        Hand Dominance   Dominant Hand: Right    Extremity/Trunk Assessment   Upper Extremity Assessment Upper Extremity Assessment: Overall WFL for tasks assessed    Lower Extremity Assessment Lower Extremity Assessment: Overall WFL for tasks assessed(strength [facility]  but pt tests weaker when not focused on MMT)    Cervical / Trunk Assessment Cervical / Trunk Assessment: Kyphotic  Communication   Communication: No difficulties  Cognition Arousal/Alertness: Awake/alert Behavior During Therapy: Impulsive Overall Cognitive Status: History of cognitive impairments - at baseline                                 General Comments: family is aware pt is confused      General Comments General comments (skin integrity, edema, etc.): Pt had pulses that were WFL from 70-77, O2 sats 97-100% and sitting BP was 151/70, standing BP 129/64    Exercises     Assessment/Plan    PT Assessment Patient needs continued PT services  PT Problem List Decreased range of motion;Decreased activity tolerance;Decreased balance;Decreased mobility;Decreased coordination;Decreased cognition;Decreased knowledge of use of DME;Decreased safety awareness       PT Treatment Interventions DME instruction;Gait training;Stair training;Functional mobility training;Therapeutic activities;Therapeutic exercise;Balance training;Neuromuscular re-education;Patient/family education    PT Goals (Current goals can be found in the Care Plan section)  Acute Rehab PT Goals Patient Stated Goal: none stated PT Goal Formulation: With family Time For Goal Achievement: 07/30/18 Potential to Achieve Goals: Fair    Frequency Min 2X/week   Barriers to discharge Inaccessible home environment;Decreased caregiver support home alone with 6 stairs to enter house    Co-evaluation               AM-PAC PT "6 Clicks" Mobility  Outcome Measure Help needed turning from your back to your side while in a flat bed without using bedrails?: A Little Help needed moving from lying on your back to sitting on the side of a flat bed without using bedrails?: A Little Help needed moving to and from a bed to a chair (including a wheelchair)?: A Little Help needed standing up from a chair using your  arms (e.g., wheelchair or bedside chair)?: A Little Help needed to walk in hospital room?: A Little Help needed climbing 3-5 steps with a railing? : Total 6 Click Score: 16    End of Session Equipment Utilized During Treatment: Gait belt Activity Tolerance: Patient limited by fatigue Patient left: in bed;with call bell/phone within reach;with bed alarm set;with family/visitor present;with nursing/sitter in room Nurse Communication: Mobility status PT Visit Diagnosis: Unsteadiness on feet (R26.81);History of falling (Z91.81)    Time: 8299-3716 PT Time Calculation (min) (ACUTE ONLY): 29 min   Charges:   PT Evaluation $PT Eval Moderate Complexity: 1 Mod PT Treatments $Gait Training: 8-22 mins       Ramond Dial 07/16/2018, 12:09 PM  Mee Hives, PT MS Acute Rehab Dept. Number: [place] and [place]

## 2018-07-17 DIAGNOSIS — M6281 Muscle weakness (generalized): Secondary | ICD-10-CM | POA: Diagnosis not present

## 2018-07-17 DIAGNOSIS — F0390 Unspecified dementia without behavioral disturbance: Secondary | ICD-10-CM | POA: Diagnosis not present

## 2018-07-17 DIAGNOSIS — Z86010 Personal history of colonic polyps: Secondary | ICD-10-CM | POA: Diagnosis not present

## 2018-07-17 DIAGNOSIS — I82432 Acute embolism and thrombosis of left popliteal vein: Secondary | ICD-10-CM | POA: Diagnosis not present

## 2018-07-17 DIAGNOSIS — D649 Anemia, unspecified: Secondary | ICD-10-CM | POA: Diagnosis not present

## 2018-07-17 DIAGNOSIS — R41841 Cognitive communication deficit: Secondary | ICD-10-CM | POA: Diagnosis not present

## 2018-07-17 DIAGNOSIS — I824Y2 Acute embolism and thrombosis of unspecified deep veins of left proximal lower extremity: Secondary | ICD-10-CM

## 2018-07-17 DIAGNOSIS — K219 Gastro-esophageal reflux disease without esophagitis: Secondary | ICD-10-CM | POA: Diagnosis not present

## 2018-07-17 DIAGNOSIS — K589 Irritable bowel syndrome without diarrhea: Secondary | ICD-10-CM | POA: Diagnosis not present

## 2018-07-17 DIAGNOSIS — M545 Low back pain: Secondary | ICD-10-CM | POA: Diagnosis not present

## 2018-07-17 DIAGNOSIS — G309 Alzheimer's disease, unspecified: Secondary | ICD-10-CM | POA: Diagnosis not present

## 2018-07-17 DIAGNOSIS — M1990 Unspecified osteoarthritis, unspecified site: Secondary | ICD-10-CM | POA: Diagnosis not present

## 2018-07-17 DIAGNOSIS — I80202 Phlebitis and thrombophlebitis of unspecified deep vessels of left lower extremity: Secondary | ICD-10-CM | POA: Diagnosis not present

## 2018-07-17 DIAGNOSIS — I10 Essential (primary) hypertension: Secondary | ICD-10-CM | POA: Diagnosis not present

## 2018-07-17 DIAGNOSIS — J309 Allergic rhinitis, unspecified: Secondary | ICD-10-CM | POA: Diagnosis not present

## 2018-07-17 DIAGNOSIS — R278 Other lack of coordination: Secondary | ICD-10-CM | POA: Diagnosis not present

## 2018-07-17 DIAGNOSIS — R269 Unspecified abnormalities of gait and mobility: Secondary | ICD-10-CM | POA: Diagnosis not present

## 2018-07-17 DIAGNOSIS — E785 Hyperlipidemia, unspecified: Secondary | ICD-10-CM | POA: Diagnosis not present

## 2018-07-17 DIAGNOSIS — R2681 Unsteadiness on feet: Secondary | ICD-10-CM | POA: Diagnosis not present

## 2018-07-17 DIAGNOSIS — I82412 Acute embolism and thrombosis of left femoral vein: Secondary | ICD-10-CM | POA: Diagnosis not present

## 2018-07-17 MED ORDER — APIXABAN 5 MG PO TABS: 10.0000 mg | ORAL_TABLET | Freq: Two times a day (BID) | ORAL | 0 refills | Status: DC

## 2018-07-17 MED ORDER — APIXABAN 5 MG PO TABS: 5.0000 mg | ORAL_TABLET | Freq: Two times a day (BID) | ORAL | 0 refills | Status: DC

## 2018-07-17 MED ORDER — AMLODIPINE BESYLATE 5 MG PO TABS: 5.0000 mg | ORAL_TABLET | Freq: Every day | ORAL | 0 refills | Status: DC

## 2018-07-17 NOTE — Discharge Summary (Signed)
Physician Discharge Summary  Shirley Wright WUJ:[id number] DOB: Nov 21, 1925 DOA: 07/14/2018  PCP: Laurey Morale, MD  Admit date: 07/14/2018 Discharge date: 07/17/2018  Admitted From: Home Disposition:  SNF  Recommendations for Outpatient Follow-up:  1. Follow up with PCP in 1-2 weeks 2. Recommend repeat BMET and CBC in 1-2 weeks  Discharge Condition:Stable CODE STATUS:Full Diet recommendation: Regular   Brief/Interim Summary: 85 y.o.femalewithhistory of hypertension dementia was brought to the ER after patient had increasing swelling of the left lower extremity over the last few days. Denies any trauma or fall. Denies any chest pain or shortness of breath.  ED Course:In the ER Dopplers revealed occlusive thrombus of left common femoral vein through popliteal vein. Calf veins are patent. Given the significant swelling ER physician discussed with on-call vascular surgeon Dr. Marjo Bicker at this time advised heparin and closely observe.   Discharge Diagnoses:  Principal Problem:   DVT (deep venous thrombosis) (HCC) Active Problems:   Essential hypertension   Acute deep vein thrombosis (DVT) of left lower extremity (HCC)   1. Left lower extremity DVT 1. Initially on heparin gtt 2. Case earlier discussed with Vascular Surgery who recommended continued therapeutic anticoagulation 3. Pt has since been transitioned to PO eliquis, anticipate total 3-6 months tx 2. Hypertension on metoprolol. 1. Stable at this time 3. Anemia, acute 1. No evidence of acute blood loss at this time 4. History of dementia with acute delirium 1. Currently stable however overnight, noted to be acutely delirious requiring mit restraints 2. Has since remained stable overnight  Discharge Instructions   Allergies as of 07/17/2018      Reactions   Penicillins    Unspecified Did it involve swelling of the face/tongue/throat, SOB, or low BP? Unknown Did it involve sudden or severe rash/hives,  skin peeling, or any reaction on the inside of your mouth or nose? Unknown Did you need to seek medical attention at a hospital or doctor's office? Unknown When did it last happen?unknown If all above answers are "NO", may proceed with cephalosporin use.   Pneumococcal Vaccine Polyvalent Other (See Comments)   unspecified   Tetanus-diphtheria Toxoids Td Other (See Comments)    unspecified      Medication List    STOP taking these medications   HYDROcodone-acetaminophen 5-325 MG tablet Commonly known as:  NORCO/VICODIN     TAKE these medications   amLODipine 5 MG tablet Commonly known as:  NORVASC Take 1 tablet (5 mg total) by mouth daily for 30 days. Start taking on:  July 18, 2018   apixaban 5 MG Tabs tablet Commonly known as:  ELIQUIS Take 2 tablets (10 mg total) by mouth 2 (two) times daily for 5 days. Start 5mg  BID dosing after completing 10mg  BID dosing   apixaban 5 MG Tabs tablet Commonly known as:  ELIQUIS Take 1 tablet (5 mg total) by mouth 2 (two) times daily for 30 days. Start 5mg  BID dosing after completing 10mg  BID dosing Start taking on:  July 22, 2018   metoprolol tartrate 50 MG tablet Commonly known as:  LOPRESSOR TAKE 1 BY MOUTH DAILY What changed:    how much to take  how to take this  when to take this  additional instructions   potassium chloride 10 MEQ tablet Commonly known as:  KLOR-CON 10 Take 1 tablet (10 mEq total) by mouth daily.      Contact information for after-discharge care    Destination    [facility] .  Service:  Skilled Nursing Contact information: [place] [place] [phone]             Allergies  Allergen Reactions  . Penicillins     Unspecified Did it involve swelling of the face/tongue/throat, SOB, or low BP? Unknown Did it involve sudden or severe rash/hives, skin peeling, or any reaction on the inside of your mouth or nose?  Unknown Did you need to seek medical attention at a hospital or doctor's office? Unknown When did it last happen?unknown If all above answers are "NO", may proceed with cephalosporin use.   . Pneumococcal Vaccine Polyvalent Other (See Comments)    unspecified  . Tetanus-Diphtheria Toxoids Td Other (See Comments)     unspecified    Procedures/Studies: US Venous Img Lower  Left (dvt Study)  Result Date: 07/14/2018 CLINICAL DATA:  Pain, swelling in left lower extremity EXAM: LEFT LOWER EXTREMITY VENOUS DOPPLER ULTRASOUND TECHNIQUE: Gray-scale sonography with graded compression, as well as color Doppler and duplex ultrasound were performed to evaluate the lower extremity deep venous systems from the level of the common femoral vein and including the common femoral, femoral, profunda femoral, popliteal and calf veins including the posterior tibial, peroneal and gastrocnemius veins when visible. The superficial great saphenous vein was also interrogated. Spectral Doppler was utilized to evaluate flow at rest and with distal augmentation maneuvers in the common femoral, femoral and popliteal veins. COMPARISON:  None. FINDINGS: Contralateral Common Femoral Vein: Respiratory phasicity is normal and symmetric with the symptomatic side. No evidence of thrombus. Normal compressibility. Common Femoral Vein: Occlusive thrombus in the common femoral vein. Saphenofemoral Junction: Appears occluded. Profunda Femoral Vein: Occlusive thrombus. Femoral Vein: Occlusive thrombus. Popliteal Vein: Occlusive thrombus. Calf Veins: No evidence of thrombus. Normal compressibility and flow on color Doppler imaging. Superficial Great Saphenous Vein: Not well visualized. Venous Reflux:  None. Other Findings:  None. IMPRESSION: There is occlusive thrombus and distention of the vessels from the left common femoral vein through the popliteal vein. Calf veins are patent. Electronically Signed   By: Rolm Baptise M.D.   On:  07/14/2018 20:50     Subjective: Without complaints this AM  Discharge Exam: Vitals:   07/16/18 2110 07/17/18 0618  BP: (!) 169/71 (!) 162/65  Pulse: 66 (!) 58  Resp: 16 16  Temp: 97.8 F (36.6 C) 98 F (36.7 C)  SpO2: 96% 94%   Vitals:   07/16/18 1106 07/16/18 1425 07/16/18 2110 07/17/18 0618  BP: 129/64 139/63 (!) 169/71 (!) 162/65  Pulse: 77 64 66 (!) 58  Resp:  16 16 16   Temp:  98 F (36.7 C) 97.8 F (36.6 C) 98 F (36.7 C)  TempSrc:  Oral Oral Oral  SpO2: 100% 96% 96% 94%  Weight:      Height:        General: Pt is alert, awake, not in acute distress Cardiovascular: RRR, S1/S2 +, no rubs, no gallops Respiratory: CTA bilaterally, no wheezing, no rhonchi Abdominal: Soft, NT, ND, bowel sounds + Extremities: no edema, no cyanosis   The results of significant diagnostics from this hospitalization (including imaging, microbiology, ancillary and laboratory) are listed below for reference.     Microbiology: No results found for this or any previous visit (from the past 240 hour(s)).   Labs: BNP (last 3 results) No results for input(s): BNP in the last 8760 hours. Basic Metabolic Panel: Recent Labs  Lab 07/14/18 2123 07/15/18 0436 07/16/18 0453  NA 133* 137 139  K 3.5 3.9 3.9  CL 103 108 109  CO2 24 25 25   GLUCOSE 103* 101* 97  BUN 20 18 12   CREATININE 0.82 0.74 0.63  CALCIUM 8.9 8.5* 8.8*  MG  --   --  2.1   Liver Function Tests: Recent Labs  Lab 07/14/18 2123  AST 19  ALT 13  ALKPHOS 74  BILITOT 0.4  PROT 6.9  ALBUMIN 3.1*   No results for input(s): LIPASE, AMYLASE in the last 168 hours. No results for input(s): AMMONIA in the last 168 hours. CBC: Recent Labs  Lab 07/14/18 2123 07/15/18 0436  WBC 7.2 6.1  NEUTROABS 4.1  --   HGB 11.3* 9.6*  HCT 36.2 31.5*  MCV 93.8 96.6  PLT 224 215   Cardiac Enzymes: No results for input(s): CKTOTAL, CKMB, CKMBINDEX, TROPONINI in the last 168 hours. BNP: Invalid input(s): POCBNP CBG: No  results for input(s): GLUCAP in the last 168 hours. D-Dimer No results for input(s): DDIMER in the last 72 hours. Hgb A1c No results for input(s): HGBA1C in the last 72 hours. Lipid Profile No results for input(s): CHOL, HDL, LDLCALC, TRIG, CHOLHDL, LDLDIRECT in the last 72 hours. Thyroid function studies No results for input(s): TSH, T4TOTAL, T3FREE, THYROIDAB in the last 72 hours.  Invalid input(s): FREET3 Anemia work up No results for input(s): VITAMINB12, FOLATE, FERRITIN, TIBC, IRON, RETICCTPCT in the last 72 hours. Urinalysis    Component Value Date/Time   COLORURINE yellow 01/17/2009 0942   APPEARANCEUR Clear 01/17/2009 0942   LABSPEC 1.015 01/17/2009 0942   PHURINE 7.0 01/17/2009 0942   HGBUR negative 01/17/2009 0942   BILIRUBINUR n 05/15/2018 1209   PROTEINUR Negative 05/15/2018 1209   UROBILINOGEN 0.2 05/15/2018 1209   UROBILINOGEN 0.2 01/17/2009 0942   NITRITE n 05/15/2018 1209   NITRITE negative 01/17/2009 0942   LEUKOCYTESUR Negative 05/15/2018 1209   Sepsis Labs Invalid input(s): PROCALCITONIN,  WBC,  LACTICIDVEN Microbiology No results found for this or any previous visit (from the past 240 hour(s)).  Time spent: 30 min  SIGNED:   Marylu Lund, MD  [facility] 07/17/2018, 12:20 PM  If 7PM-7AM, please contact night-coverage

## 2018-07-27 DIAGNOSIS — R269 Unspecified abnormalities of gait and mobility: Secondary | ICD-10-CM | POA: Diagnosis not present

## 2018-07-27 DIAGNOSIS — I80202 Phlebitis and thrombophlebitis of unspecified deep vessels of left lower extremity: Secondary | ICD-10-CM | POA: Diagnosis not present

## 2018-07-27 DIAGNOSIS — D649 Anemia, unspecified: Secondary | ICD-10-CM | POA: Diagnosis not present

## 2018-07-27 DIAGNOSIS — G309 Alzheimer's disease, unspecified: Secondary | ICD-10-CM | POA: Diagnosis not present

## 2018-08-01 ENCOUNTER — Telehealth: Payer: Self-pay

## 2018-08-01 DIAGNOSIS — D649 Anemia, unspecified: Secondary | ICD-10-CM | POA: Diagnosis not present

## 2018-08-01 DIAGNOSIS — I82432 Acute embolism and thrombosis of left popliteal vein: Secondary | ICD-10-CM | POA: Diagnosis not present

## 2018-08-01 DIAGNOSIS — I82412 Acute embolism and thrombosis of left femoral vein: Secondary | ICD-10-CM | POA: Diagnosis not present

## 2018-08-01 DIAGNOSIS — I10 Essential (primary) hypertension: Secondary | ICD-10-CM | POA: Diagnosis not present

## 2018-08-01 DIAGNOSIS — Z7901 Long term (current) use of anticoagulants: Secondary | ICD-10-CM | POA: Diagnosis not present

## 2018-08-01 DIAGNOSIS — F0280 Dementia in other diseases classified elsewhere without behavioral disturbance: Secondary | ICD-10-CM | POA: Diagnosis not present

## 2018-08-01 DIAGNOSIS — F05 Delirium due to known physiological condition: Secondary | ICD-10-CM | POA: Diagnosis not present

## 2018-08-01 DIAGNOSIS — M545 Low back pain: Secondary | ICD-10-CM | POA: Diagnosis not present

## 2018-08-01 DIAGNOSIS — K589 Irritable bowel syndrome without diarrhea: Secondary | ICD-10-CM | POA: Diagnosis not present

## 2018-08-01 DIAGNOSIS — G309 Alzheimer's disease, unspecified: Secondary | ICD-10-CM | POA: Diagnosis not present

## 2018-08-01 NOTE — Telephone Encounter (Signed)
Pt canceled

## 2018-08-06 ENCOUNTER — Inpatient Hospital Stay: Payer: Medicare Other | Admitting: Family Medicine

## 2018-08-06 ENCOUNTER — Other Ambulatory Visit: Payer: Self-pay | Admitting: *Deleted

## 2018-08-06 DIAGNOSIS — I8291 Chronic embolism and thrombosis of unspecified vein: Secondary | ICD-10-CM | POA: Diagnosis not present

## 2018-08-06 DIAGNOSIS — F0391 Unspecified dementia with behavioral disturbance: Secondary | ICD-10-CM | POA: Diagnosis not present

## 2018-08-06 DIAGNOSIS — I10 Essential (primary) hypertension: Secondary | ICD-10-CM | POA: Diagnosis not present

## 2018-08-06 DIAGNOSIS — R609 Edema, unspecified: Secondary | ICD-10-CM | POA: Diagnosis not present

## 2018-08-06 NOTE — Patient Outreach (Signed)
[facility]) Care Management  08/06/2018  Shirley Wright Dec 24, 1925 [id number]   Subjective:  Per 04/08/2018 Designated Party Release in patient's chart for Shirley Wright (daughter) and son Tarri Fuller Harari).  Telephone call to patient's home, message have reach [place] center, and no message left.    Telephone call to patient's daughter's Shirley Wright) mobile number, spoke with daughter, stated patient's name, address, and date of birth.  Discussed [facility] Care Management BCBS EMMI General Discharge follow up, daughter voiced understanding, and is in agreement to follow up on patient's behalf.   Daughter states she received the automated EMMI call and disconnected the call prior to answering the questions. States patient has been moved longterm from [facility] to the assisted living portion of the facility.  States patient had a MD follow up visit scheduled with primary, was told not to come due to the Levasy virus precautions, and is being seen by physician's assistant at the assisted living facility as needed.   States patient's blood pressure medication was discontinued by the MD due to extremity swelling and blood pressures are being monitor by the facility.  Daughter  states patient does not have any education material, EMMI follow up, care coordination, care management, disease monitoring, transportation, community resource, or pharmacy needs at this time.  States she is very appreciative of the follow up and is in agreement to receive [place] Management information on patient's behalf.      Objective Per KPN (Knowledge Performance Now, point of care tool) and chart review, patient hospitalized 07/14/2018 -07/17/2018 for DVT, discharged to [facility].   Patient also has a history of anemia and hypertension.      Assessment: Received BCBS EMMI General Discharge follow up referral on 08/06/2018.   EMMI Red Flag Alert Trigger, Day #1,  patient answered no to the following question: Scheduled follow-up?   EMMI follow up completed and no further care management needs.     Plan: RNCM will send patient successful outreach letter, [facility] pamphlet, and magnet. RNCM will complete case closure due to follow up completed / no care management needs.  RNCM will send request to update patient's home number to daughter's mobile number to [facility] Management.      Wladyslaw Henrichs H. Annia Friendly, BSN, [place] Management [facility] Telephonic CM Phone: [phone] Fax: [phone]

## 2018-08-19 DIAGNOSIS — I82432 Acute embolism and thrombosis of left popliteal vein: Secondary | ICD-10-CM | POA: Diagnosis not present

## 2018-08-19 DIAGNOSIS — M545 Low back pain: Secondary | ICD-10-CM

## 2018-08-19 DIAGNOSIS — F0280 Dementia in other diseases classified elsewhere without behavioral disturbance: Secondary | ICD-10-CM | POA: Diagnosis not present

## 2018-08-19 DIAGNOSIS — Z7901 Long term (current) use of anticoagulants: Secondary | ICD-10-CM

## 2018-08-19 DIAGNOSIS — G309 Alzheimer's disease, unspecified: Secondary | ICD-10-CM | POA: Diagnosis not present

## 2018-08-19 DIAGNOSIS — F05 Delirium due to known physiological condition: Secondary | ICD-10-CM

## 2018-08-19 DIAGNOSIS — I10 Essential (primary) hypertension: Secondary | ICD-10-CM

## 2018-08-19 DIAGNOSIS — K589 Irritable bowel syndrome without diarrhea: Secondary | ICD-10-CM

## 2018-08-19 DIAGNOSIS — I82412 Acute embolism and thrombosis of left femoral vein: Secondary | ICD-10-CM | POA: Diagnosis not present

## 2018-08-19 DIAGNOSIS — D649 Anemia, unspecified: Secondary | ICD-10-CM

## 2018-08-20 DIAGNOSIS — F05 Delirium due to known physiological condition: Secondary | ICD-10-CM

## 2018-08-20 DIAGNOSIS — G309 Alzheimer's disease, unspecified: Secondary | ICD-10-CM | POA: Diagnosis not present

## 2018-08-20 DIAGNOSIS — M545 Low back pain: Secondary | ICD-10-CM | POA: Diagnosis not present

## 2018-08-20 DIAGNOSIS — F0280 Dementia in other diseases classified elsewhere without behavioral disturbance: Secondary | ICD-10-CM | POA: Diagnosis not present

## 2018-08-20 DIAGNOSIS — Z7901 Long term (current) use of anticoagulants: Secondary | ICD-10-CM

## 2018-08-20 DIAGNOSIS — I82412 Acute embolism and thrombosis of left femoral vein: Secondary | ICD-10-CM | POA: Diagnosis not present

## 2018-08-20 DIAGNOSIS — I82432 Acute embolism and thrombosis of left popliteal vein: Secondary | ICD-10-CM | POA: Diagnosis not present

## 2018-08-20 DIAGNOSIS — I10 Essential (primary) hypertension: Secondary | ICD-10-CM

## 2018-08-20 DIAGNOSIS — K589 Irritable bowel syndrome without diarrhea: Secondary | ICD-10-CM

## 2018-08-20 DIAGNOSIS — D649 Anemia, unspecified: Secondary | ICD-10-CM

## 2018-10-21 ENCOUNTER — Ambulatory Visit (INDEPENDENT_AMBULATORY_CARE_PROVIDER_SITE_OTHER): Payer: Medicare Other

## 2018-10-21 ENCOUNTER — Encounter: Payer: Self-pay | Admitting: Family Medicine

## 2018-10-21 ENCOUNTER — Ambulatory Visit (INDEPENDENT_AMBULATORY_CARE_PROVIDER_SITE_OTHER): Payer: Medicare Other | Admitting: Family Medicine

## 2018-10-21 ENCOUNTER — Ambulatory Visit: Payer: Medicare Other | Admitting: Orthopaedic Surgery

## 2018-10-21 ENCOUNTER — Other Ambulatory Visit: Payer: Self-pay

## 2018-10-21 ENCOUNTER — Ambulatory Visit: Payer: Medicare Other | Admitting: Family Medicine

## 2018-10-21 VITALS — BP 120/78 | HR 54 | Temp 97.9°F | Wt 128.0 lb

## 2018-10-21 DIAGNOSIS — M545 Low back pain, unspecified: Secondary | ICD-10-CM

## 2018-10-21 DIAGNOSIS — I10 Essential (primary) hypertension: Secondary | ICD-10-CM

## 2018-10-21 DIAGNOSIS — M546 Pain in thoracic spine: Secondary | ICD-10-CM

## 2018-10-21 DIAGNOSIS — I824Y2 Acute embolism and thrombosis of unspecified deep veins of left proximal lower extremity: Secondary | ICD-10-CM | POA: Diagnosis not present

## 2018-10-21 NOTE — Progress Notes (Signed)
   Subjective:    Patient ID: Shirley Wright, female    DOB: 06-Oct-1925, 85 y.o.   MRN: [id number]  HPI Here with her daughter for several issues. She is now living at [facility], and she seems to be quite happy there. She was diagnosed with a DVT in the left femoral and popliteal veins in February and she was started on Eliquis. She took this for 90 days and then this was stopped (last dose was on 10-14-18). The left thigh remains slightly swollen but there is no pain. The left lower leg, ankle, and foot swelling has resolved. No chest pain or SOB. She has complained of lower back pain on and off, and she has been getting Meloxicam and Tylenol with no relief. She has gotten some pain relief however with Tramadol and with Lidoderm patches. Her appetite and sleep are good. BMs are regular.    Review of Systems  Constitutional: Negative.   Respiratory: Negative.   Cardiovascular: Positive for leg swelling. Negative for chest pain and palpitations.  Musculoskeletal: Positive for arthralgias and back pain. Negative for myalgias.       Objective:   Physical Exam Constitutional:      Comments: In a wheelchair   Cardiovascular:     Rate and Rhythm: Normal rate and regular rhythm.     Pulses: Normal pulses.     Heart sounds: Normal heart sounds.  Pulmonary:     Effort: Pulmonary effort is normal.     Breath sounds: Normal breath sounds.  Abdominal:     General: Abdomen is flat. Bowel sounds are normal. There is no distension.     Palpations: Abdomen is soft. There is no mass.     Tenderness: There is no abdominal tenderness. There is no guarding or rebound.     Hernia: No hernia is present.  Musculoskeletal:     Comments: Slight swelling in the left thigh but no tenderness, no calf swelling or tenderness. She is tender in the lower back on both sides, ROM is full. Negative SLR   Neurological:     Mental Status: She is alert.           Assessment & Plan:  She is S/P  anticoagulation treatment for a left leg DVT. We will set up another venous doppler to see if this has resolved. For the back pain, she will get Xrays today of the thoracic and lumbar spines. We will stop the Meloxicam and she can use Tylenol or Tramadol for pain. Her HTN is stable.  Alysia Penna, MD

## 2018-10-22 ENCOUNTER — Telehealth: Payer: Self-pay | Admitting: Family Medicine

## 2018-10-22 NOTE — Telephone Encounter (Signed)
Dr. Fry please advise. Thanks  

## 2018-10-22 NOTE — Telephone Encounter (Signed)
Copied from [place] [id number]. Topic: General - Other >> Oct 22, 2018 12:58 PM Carolyn Stare wrote:  Pt is in [facility] assisted living and they will have a mobile unit come out and do the test , they need an order fax to them daughter will call back with fax number   Pt daughter will cancel the appt that is scheduled at heart care  Fax to [facility]:  [phone]  This is for an [place].

## 2018-10-23 NOTE — Telephone Encounter (Signed)
I have called and lmom for Shirley Wright to make her aware of order for the [place] has been faxed.   Nothing further is needed.

## 2018-10-23 NOTE — Telephone Encounter (Signed)
The order is ready to fax  

## 2018-10-24 ENCOUNTER — Encounter (HOSPITAL_COMMUNITY): Payer: Medicare Other

## 2018-10-24 DIAGNOSIS — I82409 Acute embolism and thrombosis of unspecified deep veins of unspecified lower extremity: Secondary | ICD-10-CM | POA: Diagnosis not present

## 2018-11-07 ENCOUNTER — Encounter: Payer: Self-pay | Admitting: Family Medicine

## 2018-11-07 NOTE — Telephone Encounter (Signed)
Dr. Fry please advise. Thanks  

## 2018-11-07 NOTE — Telephone Encounter (Signed)
I never received any results of this [place]. Please see if you can locate a report

## 2018-11-07 NOTE — Telephone Encounter (Signed)
Called and spoke with Debbie----she will have [facility] to have the report faxed over to Dr. Sarajane Jews

## 2018-11-14 ENCOUNTER — Encounter: Payer: Self-pay | Admitting: Family Medicine

## 2018-11-14 MED ORDER — APIXABAN 5 MG PO TABS: 5.0000 mg | ORAL_TABLET | Freq: Two times a day (BID) | ORAL | 5 refills | Status: DC

## 2018-11-14 NOTE — Telephone Encounter (Signed)
Dr. Fry please advise. Thanks  

## 2018-11-14 NOTE — Telephone Encounter (Signed)
I think we should get her back on Eliquis 5 mg BID for another 6 months. Call in #60 with 5 rf. After the 6 months we should get another doppler US to monitor this

## 2018-12-03 DIAGNOSIS — F0390 Unspecified dementia without behavioral disturbance: Secondary | ICD-10-CM | POA: Diagnosis not present

## 2018-12-03 DIAGNOSIS — F419 Anxiety disorder, unspecified: Secondary | ICD-10-CM | POA: Diagnosis not present

## 2018-12-08 ENCOUNTER — Other Ambulatory Visit: Payer: Self-pay

## 2018-12-08 ENCOUNTER — Emergency Department (HOSPITAL_COMMUNITY): Payer: Medicare Other

## 2018-12-08 ENCOUNTER — Encounter (HOSPITAL_COMMUNITY): Payer: Self-pay

## 2018-12-08 ENCOUNTER — Emergency Department (HOSPITAL_COMMUNITY)
Admission: EM | Admit: 2018-12-08 | Discharge: 2018-12-08 | Disposition: A | Discharge status: Home / Self Care | Payer: Medicare Other | Source: Home / Self Care | Attending: Emergency Medicine | Admitting: Emergency Medicine

## 2018-12-08 DIAGNOSIS — M2550 Pain in unspecified joint: Secondary | ICD-10-CM | POA: Diagnosis not present

## 2018-12-08 DIAGNOSIS — Z515 Encounter for palliative care: Secondary | ICD-10-CM | POA: Diagnosis not present

## 2018-12-08 DIAGNOSIS — I951 Orthostatic hypotension: Secondary | ICD-10-CM | POA: Diagnosis not present

## 2018-12-08 DIAGNOSIS — Y939 Activity, unspecified: Secondary | ICD-10-CM | POA: Insufficient documentation

## 2018-12-08 DIAGNOSIS — S0003XA Contusion of scalp, initial encounter: Secondary | ICD-10-CM

## 2018-12-08 DIAGNOSIS — E876 Hypokalemia: Secondary | ICD-10-CM | POA: Diagnosis not present

## 2018-12-08 DIAGNOSIS — Z7901 Long term (current) use of anticoagulants: Secondary | ICD-10-CM | POA: Diagnosis not present

## 2018-12-08 DIAGNOSIS — R55 Syncope and collapse: Secondary | ICD-10-CM | POA: Diagnosis not present

## 2018-12-08 DIAGNOSIS — E785 Hyperlipidemia, unspecified: Secondary | ICD-10-CM | POA: Diagnosis not present

## 2018-12-08 DIAGNOSIS — S0001XA Abrasion of scalp, initial encounter: Secondary | ICD-10-CM

## 2018-12-08 DIAGNOSIS — S0083XA Contusion of other part of head, initial encounter: Secondary | ICD-10-CM | POA: Diagnosis not present

## 2018-12-08 DIAGNOSIS — R5381 Other malaise: Secondary | ICD-10-CM | POA: Diagnosis not present

## 2018-12-08 DIAGNOSIS — R296 Repeated falls: Secondary | ICD-10-CM | POA: Diagnosis not present

## 2018-12-08 DIAGNOSIS — S0181XA Laceration without foreign body of other part of head, initial encounter: Secondary | ICD-10-CM | POA: Diagnosis present

## 2018-12-08 DIAGNOSIS — S1989XA Other specified injuries of other specified part of neck, initial encounter: Secondary | ICD-10-CM | POA: Diagnosis not present

## 2018-12-08 DIAGNOSIS — E860 Dehydration: Secondary | ICD-10-CM | POA: Diagnosis not present

## 2018-12-08 DIAGNOSIS — Y92129 Unspecified place in nursing home as the place of occurrence of the external cause: Secondary | ICD-10-CM | POA: Insufficient documentation

## 2018-12-08 DIAGNOSIS — I959 Hypotension, unspecified: Secondary | ICD-10-CM | POA: Diagnosis not present

## 2018-12-08 DIAGNOSIS — N179 Acute kidney failure, unspecified: Secondary | ICD-10-CM | POA: Diagnosis not present

## 2018-12-08 DIAGNOSIS — S79911A Unspecified injury of right hip, initial encounter: Secondary | ICD-10-CM | POA: Diagnosis not present

## 2018-12-08 DIAGNOSIS — M25551 Pain in right hip: Secondary | ICD-10-CM | POA: Diagnosis not present

## 2018-12-08 DIAGNOSIS — D649 Anemia, unspecified: Secondary | ICD-10-CM | POA: Diagnosis not present

## 2018-12-08 DIAGNOSIS — I10 Essential (primary) hypertension: Secondary | ICD-10-CM

## 2018-12-08 DIAGNOSIS — F329 Major depressive disorder, single episode, unspecified: Secondary | ICD-10-CM | POA: Diagnosis not present

## 2018-12-08 DIAGNOSIS — Z1211 Encounter for screening for malignant neoplasm of colon: Secondary | ICD-10-CM | POA: Diagnosis not present

## 2018-12-08 DIAGNOSIS — D509 Iron deficiency anemia, unspecified: Secondary | ICD-10-CM | POA: Diagnosis not present

## 2018-12-08 DIAGNOSIS — Y999 Unspecified external cause status: Secondary | ICD-10-CM | POA: Insufficient documentation

## 2018-12-08 DIAGNOSIS — Z66 Do not resuscitate: Secondary | ICD-10-CM | POA: Diagnosis present

## 2018-12-08 DIAGNOSIS — R410 Disorientation, unspecified: Secondary | ICD-10-CM | POA: Diagnosis not present

## 2018-12-08 DIAGNOSIS — R58 Hemorrhage, not elsewhere classified: Secondary | ICD-10-CM | POA: Diagnosis not present

## 2018-12-08 DIAGNOSIS — I82402 Acute embolism and thrombosis of unspecified deep veins of left lower extremity: Secondary | ICD-10-CM | POA: Diagnosis not present

## 2018-12-08 DIAGNOSIS — R0902 Hypoxemia: Secondary | ICD-10-CM | POA: Diagnosis not present

## 2018-12-08 DIAGNOSIS — Z7189 Other specified counseling: Secondary | ICD-10-CM | POA: Diagnosis not present

## 2018-12-08 DIAGNOSIS — K219 Gastro-esophageal reflux disease without esophagitis: Secondary | ICD-10-CM | POA: Diagnosis not present

## 2018-12-08 DIAGNOSIS — Z950 Presence of cardiac pacemaker: Secondary | ICD-10-CM | POA: Diagnosis not present

## 2018-12-08 DIAGNOSIS — Z86718 Personal history of other venous thrombosis and embolism: Secondary | ICD-10-CM | POA: Diagnosis not present

## 2018-12-08 DIAGNOSIS — Z1159 Encounter for screening for other viral diseases: Secondary | ICD-10-CM | POA: Diagnosis not present

## 2018-12-08 DIAGNOSIS — W19XXXA Unspecified fall, initial encounter: Secondary | ICD-10-CM | POA: Diagnosis not present

## 2018-12-08 DIAGNOSIS — R52 Pain, unspecified: Secondary | ICD-10-CM | POA: Diagnosis not present

## 2018-12-08 DIAGNOSIS — R404 Transient alteration of awareness: Secondary | ICD-10-CM | POA: Diagnosis not present

## 2018-12-08 DIAGNOSIS — E871 Hypo-osmolality and hyponatremia: Secondary | ICD-10-CM | POA: Diagnosis not present

## 2018-12-08 DIAGNOSIS — Z79899 Other long term (current) drug therapy: Secondary | ICD-10-CM | POA: Diagnosis not present

## 2018-12-08 DIAGNOSIS — F0390 Unspecified dementia without behavioral disturbance: Secondary | ICD-10-CM | POA: Insufficient documentation

## 2018-12-08 DIAGNOSIS — Z90710 Acquired absence of both cervix and uterus: Secondary | ICD-10-CM | POA: Diagnosis not present

## 2018-12-08 DIAGNOSIS — Z7401 Bed confinement status: Secondary | ICD-10-CM | POA: Diagnosis not present

## 2018-12-08 NOTE — ED Triage Notes (Signed)
Pt was wandering the halls at [facility] when the staff found her on the ground with blood on the floor. Pt was alert according to her baseline for staff. When ems arrived patient was helped up to sit she had two hematoma on the right side of the head one near the back of the head the size of a golf ball with two small lacs in them and the other on the forehead. Pt is at her baseline. Able to answer some questions. Able to move all extremities for ems.

## 2018-12-08 NOTE — Discharge Instructions (Addendum)
Apply ice as needed.  Take acetaminophen as needed for pain.

## 2018-12-08 NOTE — ED Provider Notes (Signed)
[facility] EMERGENCY DEPARTMENT Provider Note   CSN: [id number] Arrival date & time: 12/08/18  8295    History   Chief Complaint Chief Complaint  Patient presents with  . Fall  . Head Injury    HPI Shirley Wright is a 85 y.o. female.   The history is provided by the nursing home. The history is limited by the condition of the patient (Dementia).  Fall  Head Injury She has history of hypertension, hyperlipidemia, DVT was sent here from nursing home after having an unwitnessed fall.  There was a large amount of blood pooled around with the patient fell.  EMS noted scalp hematoma with 2 lacerations.  Patient has severe dementia and is unable to give any history.  Past Medical History:  Diagnosis Date  . Adenomatous colon polyp    tubular  . Allergic rhinitis   . Diverticulosis   . GERD (gastroesophageal reflux disease)   . Hemorrhoids   . Hepatic cyst   . History of colonic polyps   . Hyperlipidemia   . Hypertension   . IBS (irritable bowel syndrome)   . Osteoarthritis     Patient Active Problem List   Diagnosis Date Noted  . Acute deep vein thrombosis (DVT) of left lower extremity ([place]) 07/15/2018  . DVT (deep venous thrombosis) ([place]) 07/14/2018  . Onychomycosis 02/20/2018  . Memory loss 02/20/2018  . GERD 04/16/2007  . IRRITABLE BOWEL SYNDROME 04/16/2007  . Osteoarthritis 04/16/2007  . BACK PAIN, LUMBAR 04/16/2007  . COLONIC POLYPS, HX OF 04/16/2007  . Dyslipidemia 01/27/2007  . Essential hypertension 01/27/2007  . ALLERGIC RHINITIS 01/27/2007    Past Surgical History:  Procedure Laterality Date  . benign breast biopsy    . COLONOSCOPY  06 20 11   Dr Earlean Shawl,  benign polyp, diverticulosis, no repeats recommended  . ganglion cyst removed     left thumba  . LUMBAR FUSION     L3-4 and L4-5 per Dr. Carloyn Manner 2002  . TONSILLECTOMY    . VAGINAL HYSTERECTOMY     with ovaries intact     OB History   No obstetric history on file.       Home Medications    Prior to Admission medications   Medication Sig Start Date End Date Taking? Authorizing Provider  acetaminophen (TYLENOL) 325 MG tablet Take 650 mg by mouth 3 (three) times daily as needed.    [provider]  apixaban (ELIQUIS) 5 MG TABS tablet Take 2 tablets (10 mg total) by mouth 2 (two) times daily for 5 days. Start 5mg  BID dosing after completing 10mg  BID dosing 07/17/18 07/22/18  Donne Brieanna, MD  apixaban (ELIQUIS) 5 MG TABS tablet Take 1 tablet (5 mg total) by mouth 2 (two) times daily for 30 days. Start 5mg  BID dosing after completing 10mg  BID dosing 11/14/18 12/14/18  Laurey Morale, MD  cholecalciferol (VITAMIN D3) 25 MCG (1000 UT) tablet Take 1,000 Units by mouth daily.    [provider]  meloxicam (MOBIC) 7.5 MG tablet  10/15/18   [provider]  metoprolol tartrate (LOPRESSOR) 50 MG tablet TAKE 1 BY MOUTH DAILY Patient taking differently: Take 50 mg by mouth daily.  05/15/18   Laurey Morale, MD  potassium chloride (KLOR-CON 10) 10 MEQ tablet Take 1 tablet (10 mEq total) by mouth daily. 05/15/18   Laurey Morale, MD  traMADol Veatrice Bourbon) 50 MG tablet  09/29/18   [provider]    Family History  Family History  Problem Relation Age of Onset  . Arthritis Other   . Diabetes Other   . Hypertension Other   . Lung cancer Other   . Heart disease Other     Social History Social History   Tobacco Use  . Smoking status: Never Smoker  . Smokeless tobacco: Never Used  Substance Use Topics  . Alcohol use: No    Alcohol/week: 0.0 standard drinks    Comment: once every 2-3 months  . Drug use: No     Allergies   Penicillins, Pneumococcal vaccine polyvalent, and Tetanus-diphtheria toxoids td   Review of Systems Review of Systems  Unable to perform ROS: Dementia     Physical Exam Updated Vital Signs BP (!) 202/72 (BP Location: Right Arm)   Pulse 71   Temp 97.8 F (36.6 C) (Oral)   Resp 20   SpO2 97%   Physical  Exam Vitals signs and nursing note reviewed.    85 year old female, resting comfortably and in no acute distress. Vital signs are significant for elevated blood pressure. Oxygen saturation is 97%, which is normal. Head is normocephalic.  There is a large hematoma in the right occipital region of the scalp with 2 small abrasions present but no discrete laceration.Marland Kitchen PERRLA, EOMI. Oropharynx is clear. Neck is immobilized in a stiff cervical collar and is nontender without adenopathy or JVD. Back is nontender and there is no CVA tenderness. Lungs are clear without rales, wheezes, or rhonchi. Chest is nontender. Heart has regular rate and rhythm without murmur. Abdomen is soft, flat, nontender without masses or hepatosplenomegaly and peristalsis is normoactive. Extremities have no cyanosis or edema, full range of motion is present. Skin is warm and dry without rash. Neurologic: Awake and alert, oriented to person and knows she is in an emergency department but is totally disoriented to time, cranial nerves are intact, there are no motor or sensory deficits.  ED Treatments / Results   EKG EKG Interpretation  Date/Time:  Monday December 08 2018 06:32:37 EDT Ventricular Rate:  64 PR Interval:    QRS Duration: 92 QT Interval:  456 QTC Calculation: 471 R Axis:   12 Text Interpretation:  Sinus rhythm Abnormal R-wave progression, early transition Artifact in lead(s) I II III aVR aVL aVF No old tracing to compare Confirmed by Delora Fuel ([id number]) on 12/08/2018 6:43:25 AM   Radiology No results found.  Procedures Procedures   Medications Ordered in ED Medications - No data to display   Initial Impression / Assessment and Plan / ED Course  I have reviewed the triage vital signs and the nursing notes.  Pertinent imaging results that were available during my care of the patient were reviewed by me and considered in my medical decision making (see chart for details).  Fall at nursing home with  scalp hematoma and scalp abrasion but no discrete laceration.  Old records are reviewed and she has no relevant past visits.  Of note, she had been on anticoagulants for DVT, but last dose of apixaban was approximately 5 days ago.  She will be sent for CT of head and cervical spine.  She has a history of tetanus allergy, so Tdap booster is not given.  Case is signed out to Dr. Regenia Skeeter.  Final Clinical Impressions(s) / ED Diagnoses   Final diagnoses:  Fall at nursing home, initial encounter  Scalp hematoma, initial encounter  Scalp abrasion, initial encounter  Elevated blood pressure reading with diagnosis of hypertension  ED Discharge Orders    None       Delora Fuel, MD 46/50/35 [phone]

## 2018-12-08 NOTE — ED Notes (Signed)
PTAR called @ 1021-per Mallory, RN called by Levada Dy

## 2018-12-08 NOTE — ED Notes (Signed)
Patient verbalized understanding of discharge instructions. Opportunities for questioning and answers were provided. Armband removed by staff. Patient removed from ED.   

## 2018-12-08 NOTE — ED Provider Notes (Signed)
Care transferred to me.  CT head and C-spine are without acute abnormalities.  On reassessment there is no acute bleeding or obvious laceration.  There is abrasion but no active bleeding.  Discharged back to nursing facility.  Results for orders placed or performed during the hospital encounter of 07/14/18  Comprehensive metabolic panel  Result Value Ref Range   Sodium 133 (L) 135 - 145 mmol/L   Potassium 3.5 3.5 - 5.1 mmol/L   Chloride 103 98 - 111 mmol/L   CO2 24 22 - 32 mmol/L   Glucose, Bld 103 (H) 70 - 99 mg/dL   BUN 20 8 - 23 mg/dL   Creatinine, Ser 0.82 0.44 - 1.00 mg/dL   Calcium 8.9 8.9 - 10.3 mg/dL   Total Protein 6.9 6.5 - 8.1 g/dL   Albumin 3.1 (L) 3.5 - 5.0 g/dL   AST 19 15 - 41 U/L   ALT 13 0 - 44 U/L   Alkaline Phosphatase 74 38 - 126 U/L   Total Bilirubin 0.4 0.3 - 1.2 mg/dL   GFR calc non Af Amer >60 >60 mL/min   GFR calc Af Amer >60 >60 mL/min   Anion gap 6 5 - 15  CBC with Differential  Result Value Ref Range   WBC 7.2 4.0 - 10.5 K/uL   RBC 3.86 (L) 3.87 - 5.11 MIL/uL   Hemoglobin 11.3 (L) 12.0 - 15.0 g/dL   HCT 36.2 36.0 - 46.0 %   MCV 93.8 80.0 - 100.0 fL   MCH 29.3 26.0 - 34.0 pg   MCHC 31.2 30.0 - 36.0 g/dL   RDW 13.7 11.5 - 15.5 %   Platelets 224 150 - 400 K/uL   nRBC 0.0 0.0 - 0.2 %   Neutrophils Relative % 58 %   Neutro Abs 4.1 1.7 - 7.7 K/uL   Lymphocytes Relative 28 %   Lymphs Abs 2.0 0.7 - 4.0 K/uL   Monocytes Relative 10 %   Monocytes Absolute 0.7 0.1 - 1.0 K/uL   Eosinophils Relative 3 %   Eosinophils Absolute 0.2 0.0 - 0.5 K/uL   Basophils Relative 0 %   Basophils Absolute 0.0 0.0 - 0.1 K/uL   Immature Granulocytes 1 %   Abs Immature Granulocytes 0.06 0.00 - 0.07 K/uL  Protime-INR  Result Value Ref Range   Prothrombin Time 13.9 11.4 - 15.2 seconds   INR 1.1   Basic metabolic panel  Result Value Ref Range   Sodium 137 135 - 145 mmol/L   Potassium 3.9 3.5 - 5.1 mmol/L   Chloride 108 98 - 111 mmol/L   CO2 25 22 - 32 mmol/L    Glucose, Bld 101 (H) 70 - 99 mg/dL   BUN 18 8 - 23 mg/dL   Creatinine, Ser 0.74 0.44 - 1.00 mg/dL   Calcium 8.5 (L) 8.9 - 10.3 mg/dL   GFR calc non Af Amer >60 >60 mL/min   GFR calc Af Amer >60 >60 mL/min   Anion gap 4 (L) 5 - 15  CBC  Result Value Ref Range   WBC 6.1 4.0 - 10.5 K/uL   RBC 3.26 (L) 3.87 - 5.11 MIL/uL   Hemoglobin 9.6 (L) 12.0 - 15.0 g/dL   HCT 31.5 (L) 36.0 - 46.0 %   MCV 96.6 80.0 - 100.0 fL   MCH 29.4 26.0 - 34.0 pg   MCHC 30.5 30.0 - 36.0 g/dL   RDW 13.6 11.5 - 15.5 %   Platelets 215 150 - 400 K/uL  nRBC 0.0 0.0 - 0.2 %  Heparin level (unfractionated)  Result Value Ref Range   Heparin Unfractionated 0.84 (H) 0.30 - 0.70 IU/mL  Basic metabolic panel  Result Value Ref Range   Sodium 139 135 - 145 mmol/L   Potassium 3.9 3.5 - 5.1 mmol/L   Chloride 109 98 - 111 mmol/L   CO2 25 22 - 32 mmol/L   Glucose, Bld 97 70 - 99 mg/dL   BUN 12 8 - 23 mg/dL   Creatinine, Ser 0.63 0.44 - 1.00 mg/dL   Calcium 8.8 (L) 8.9 - 10.3 mg/dL   GFR calc non Af Amer >60 >60 mL/min   GFR calc Af Amer >60 >60 mL/min   Anion gap 5 5 - 15  Magnesium  Result Value Ref Range   Magnesium 2.1 1.7 - 2.4 mg/dL   Ct Head Wo Contrast  Result Date: 12/08/2018 CLINICAL DATA:  Right head lacerations and soft tissue swelling following a fall today. EXAM: CT HEAD WITHOUT CONTRAST CT CERVICAL SPINE WITHOUT CONTRAST TECHNIQUE: Multidetector CT imaging of the head and cervical spine was performed following the standard protocol without intravenous contrast. Multiplanar CT image reconstructions of the cervical spine were also generated. COMPARISON:  None. FINDINGS: CT HEAD FINDINGS Brain: Mild-to-moderate enlargement of the ventricles and subarachnoid spaces. Mild-to-moderate patchy white matter low density in both cerebral hemispheres. No intracranial hemorrhage, mass lesion or CT evidence of acute infarction. Vascular: No hyperdense vessel or unexpected calcification. Skull: Bilateral hyperostosis  frontalis. 2.2 x 1.1 cm oval, homogeneously densely calcified mass arising from the anterior aspect of the right frontal bone with a broad base against the skull. No skull fractures are seen. Sinuses/Orbits: Status post bilateral cataract extraction. Unremarkable bones and included paranasal sinuses. Other: Right posterolateral scalp hematoma. CT CERVICAL SPINE FINDINGS Alignment: Mild reversal the normal lordosis in the mid to lower cervical spine. Mild dextroconvex cervicothoracic scoliosis. Mild retrolisthesis at the C4-5 and C5-6 levels. Skull base and vertebrae: No acute fracture. No primary bone lesion or focal pathologic process. Soft tissues and spinal canal: No prevertebral fluid or swelling. No visible canal hematoma. Disc levels: Multilevel degenerative changes, most pronounced at the C4-5, C5-6 and C6-7 levels. Upper chest: Clear lung apices. Other: None. IMPRESSION: 1. Right posterolateral scalp hematoma without skull fracture or intracranial hemorrhage. 2. No cervical spine fracture or traumatic subluxation. 3. Mild to moderate diffuse cerebral and cerebellar atrophy. 4. Mild to moderate chronic small vessel white matter ischemic changes in both cerebral hemispheres. 5. 2.2 x 1.1 cm right frontal old, calcified cephalohematoma or exostosis. 6. Multilevel cervical spine degenerative changes. Electronically Signed   By: Claudie Revering M.D.   On: 12/08/2018 09:41   Ct Cervical Spine Wo Contrast  Result Date: 12/08/2018 CLINICAL DATA:  Right head lacerations and soft tissue swelling following a fall today. EXAM: CT HEAD WITHOUT CONTRAST CT CERVICAL SPINE WITHOUT CONTRAST TECHNIQUE: Multidetector CT imaging of the head and cervical spine was performed following the standard protocol without intravenous contrast. Multiplanar CT image reconstructions of the cervical spine were also generated. COMPARISON:  None. FINDINGS: CT HEAD FINDINGS Brain: Mild-to-moderate enlargement of the ventricles and subarachnoid  spaces. Mild-to-moderate patchy white matter low density in both cerebral hemispheres. No intracranial hemorrhage, mass lesion or CT evidence of acute infarction. Vascular: No hyperdense vessel or unexpected calcification. Skull: Bilateral hyperostosis frontalis. 2.2 x 1.1 cm oval, homogeneously densely calcified mass arising from the anterior aspect of the right frontal bone with a broad base against the skull. No  skull fractures are seen. Sinuses/Orbits: Status post bilateral cataract extraction. Unremarkable bones and included paranasal sinuses. Other: Right posterolateral scalp hematoma. CT CERVICAL SPINE FINDINGS Alignment: Mild reversal the normal lordosis in the mid to lower cervical spine. Mild dextroconvex cervicothoracic scoliosis. Mild retrolisthesis at the C4-5 and C5-6 levels. Skull base and vertebrae: No acute fracture. No primary bone lesion or focal pathologic process. Soft tissues and spinal canal: No prevertebral fluid or swelling. No visible canal hematoma. Disc levels: Multilevel degenerative changes, most pronounced at the C4-5, C5-6 and C6-7 levels. Upper chest: Clear lung apices. Other: None. IMPRESSION: 1. Right posterolateral scalp hematoma without skull fracture or intracranial hemorrhage. 2. No cervical spine fracture or traumatic subluxation. 3. Mild to moderate diffuse cerebral and cerebellar atrophy. 4. Mild to moderate chronic small vessel white matter ischemic changes in both cerebral hemispheres. 5. 2.2 x 1.1 cm right frontal old, calcified cephalohematoma or exostosis. 6. Multilevel cervical spine degenerative changes. Electronically Signed   By: Claudie Revering M.D.   On: 12/08/2018 09:41      Sherwood Gambler, MD 12/08/18 [phone]

## 2018-12-09 ENCOUNTER — Inpatient Hospital Stay (HOSPITAL_COMMUNITY)
Admission: EM | Admit: 2018-12-09 | Discharge: 2018-12-12 | DRG: 312 | Disposition: A | Discharge status: Skilled Nursing Facility | Payer: Medicare Other | Source: Skilled Nursing Facility | Attending: Family Medicine | Admitting: Family Medicine

## 2018-12-09 ENCOUNTER — Encounter (HOSPITAL_COMMUNITY): Payer: Self-pay | Admitting: Emergency Medicine

## 2018-12-09 ENCOUNTER — Other Ambulatory Visit: Payer: Self-pay

## 2018-12-09 DIAGNOSIS — Z79899 Other long term (current) drug therapy: Secondary | ICD-10-CM

## 2018-12-09 DIAGNOSIS — S0181XA Laceration without foreign body of other part of head, initial encounter: Secondary | ICD-10-CM | POA: Diagnosis present

## 2018-12-09 DIAGNOSIS — F0390 Unspecified dementia without behavioral disturbance: Secondary | ICD-10-CM | POA: Diagnosis present

## 2018-12-09 DIAGNOSIS — I10 Essential (primary) hypertension: Secondary | ICD-10-CM | POA: Diagnosis present

## 2018-12-09 DIAGNOSIS — D509 Iron deficiency anemia, unspecified: Secondary | ICD-10-CM | POA: Diagnosis present

## 2018-12-09 DIAGNOSIS — E860 Dehydration: Secondary | ICD-10-CM | POA: Diagnosis present

## 2018-12-09 DIAGNOSIS — Z66 Do not resuscitate: Secondary | ICD-10-CM | POA: Diagnosis present

## 2018-12-09 DIAGNOSIS — Z7901 Long term (current) use of anticoagulants: Secondary | ICD-10-CM

## 2018-12-09 DIAGNOSIS — D649 Anemia, unspecified: Secondary | ICD-10-CM

## 2018-12-09 DIAGNOSIS — I82409 Acute embolism and thrombosis of unspecified deep veins of unspecified lower extremity: Secondary | ICD-10-CM | POA: Diagnosis present

## 2018-12-09 DIAGNOSIS — S0083XA Contusion of other part of head, initial encounter: Secondary | ICD-10-CM

## 2018-12-09 DIAGNOSIS — R296 Repeated falls: Secondary | ICD-10-CM

## 2018-12-09 DIAGNOSIS — Z86718 Personal history of other venous thrombosis and embolism: Secondary | ICD-10-CM

## 2018-12-09 DIAGNOSIS — N179 Acute kidney failure, unspecified: Secondary | ICD-10-CM | POA: Diagnosis not present

## 2018-12-09 DIAGNOSIS — S0003XA Contusion of scalp, initial encounter: Secondary | ICD-10-CM | POA: Diagnosis present

## 2018-12-09 DIAGNOSIS — Z1159 Encounter for screening for other viral diseases: Secondary | ICD-10-CM

## 2018-12-09 DIAGNOSIS — E876 Hypokalemia: Secondary | ICD-10-CM | POA: Diagnosis present

## 2018-12-09 DIAGNOSIS — Z90710 Acquired absence of both cervix and uterus: Secondary | ICD-10-CM

## 2018-12-09 DIAGNOSIS — E785 Hyperlipidemia, unspecified: Secondary | ICD-10-CM | POA: Diagnosis present

## 2018-12-09 DIAGNOSIS — E871 Hypo-osmolality and hyponatremia: Secondary | ICD-10-CM | POA: Diagnosis present

## 2018-12-09 DIAGNOSIS — W19XXXA Unspecified fall, initial encounter: Secondary | ICD-10-CM | POA: Diagnosis not present

## 2018-12-09 DIAGNOSIS — F32A Depression, unspecified: Secondary | ICD-10-CM | POA: Diagnosis present

## 2018-12-09 DIAGNOSIS — Z950 Presence of cardiac pacemaker: Secondary | ICD-10-CM

## 2018-12-09 DIAGNOSIS — F329 Major depressive disorder, single episode, unspecified: Secondary | ICD-10-CM | POA: Diagnosis present

## 2018-12-09 DIAGNOSIS — I951 Orthostatic hypotension: Principal | ICD-10-CM | POA: Diagnosis present

## 2018-12-09 DIAGNOSIS — S0001XA Abrasion of scalp, initial encounter: Secondary | ICD-10-CM | POA: Diagnosis present

## 2018-12-09 DIAGNOSIS — K219 Gastro-esophageal reflux disease without esophagitis: Secondary | ICD-10-CM | POA: Diagnosis present

## 2018-12-09 DIAGNOSIS — Y92129 Unspecified place in nursing home as the place of occurrence of the external cause: Secondary | ICD-10-CM

## 2018-12-09 DIAGNOSIS — M25551 Pain in right hip: Secondary | ICD-10-CM

## 2018-12-09 LAB — IRON AND TIBC
Iron: 17 ug/dL — ABNORMAL LOW (ref 28–170)
Saturation Ratios: 4 % — ABNORMAL LOW (ref 10.4–31.8)
TIBC: 448 ug/dL (ref 250–450)
UIBC: 431 ug/dL

## 2018-12-09 LAB — BASIC METABOLIC PANEL
Anion gap: 12 (ref 5–15)
BUN: 21 mg/dL (ref 8–23)
CO2: 23 mmol/L (ref 22–32)
Calcium: 9.4 mg/dL (ref 8.9–10.3)
Chloride: 95 mmol/L — ABNORMAL LOW (ref 98–111)
Creatinine, Ser: 1.23 mg/dL — ABNORMAL HIGH (ref 0.44–1.00)
GFR calc Af Amer: 44 mL/min — ABNORMAL LOW (ref >60)
GFR calc non Af Amer: 38 mL/min — ABNORMAL LOW (ref >60)
Glucose, Bld: 145 mg/dL — ABNORMAL HIGH (ref 70–99)
Potassium: 3.4 mmol/L — ABNORMAL LOW (ref 3.5–5.1)
Sodium: 130 mmol/L — ABNORMAL LOW (ref 135–145)

## 2018-12-09 LAB — CBC
HCT: 31.4 % — ABNORMAL LOW (ref 36.0–46.0)
Hemoglobin: 8.5 g/dL — ABNORMAL LOW (ref 12.0–15.0)
MCH: 20.8 pg — ABNORMAL LOW (ref 26.0–34.0)
MCHC: 27.1 g/dL — ABNORMAL LOW (ref 30.0–36.0)
MCV: 77 fL — ABNORMAL LOW (ref 80.0–100.0)
Platelets: 311 10*3/uL (ref 150–400)
RBC: 4.08 MIL/uL (ref 3.87–5.11)
RDW: 18.9 % — ABNORMAL HIGH (ref 11.5–15.5)
WBC: 11.3 10*3/uL — ABNORMAL HIGH (ref 4.0–10.5)
nRBC: 0 % (ref 0.0–0.2)

## 2018-12-09 LAB — RETICULOCYTES
Immature Retic Fract: 39.2 % — ABNORMAL HIGH (ref 2.3–15.9)
RBC.: 4.06 MIL/uL (ref 3.87–5.11)
Retic Count, Absolute: 103.1 10*3/uL (ref 19.0–186.0)
Retic Ct Pct: 2.5 % (ref 0.4–3.1)

## 2018-12-09 LAB — POC OCCULT BLOOD, ED: Fecal Occult Bld: NEGATIVE

## 2018-12-09 LAB — VITAMIN B12: Vitamin B-12: 437 pg/mL (ref 180–914)

## 2018-12-09 LAB — FERRITIN: Ferritin: 52 ng/mL (ref 11–307)

## 2018-12-09 LAB — FOLATE: Folate: 13.5 ng/mL (ref >5.9)

## 2018-12-09 LAB — SARS CORONAVIRUS 2 BY RT PCR (HOSPITAL ORDER, PERFORMED IN ~~LOC~~ HOSPITAL LAB): SARS Coronavirus 2: NEGATIVE

## 2018-12-09 MED ORDER — HYDRALAZINE HCL 20 MG/ML IJ SOLN
5.0000 mg | INTRAMUSCULAR | Status: DC | PRN
  Administered 2018-12-10: 07:00:00 5 mg via INTRAVENOUS
  Filled 2018-12-09: qty 1

## 2018-12-09 MED ORDER — SODIUM CHLORIDE 0.9 % IV SOLN
INTRAVENOUS | Status: DC
  Administered 2018-12-09: via INTRAVENOUS
  Administered 2018-12-10: 14:00:00 75 mL/h via INTRAVENOUS
  Administered 2018-12-11: 03:00:00 via INTRAVENOUS

## 2018-12-09 MED ORDER — DULOXETINE HCL 30 MG PO CPEP
30.0000 mg | ORAL_CAPSULE | Freq: Every day | ORAL | Status: DC
  Administered 2018-12-10 – 2018-12-12 (×3): 30 mg via ORAL
  Filled 2018-12-09 (×3): qty 1

## 2018-12-09 MED ORDER — HALOPERIDOL LACTATE 5 MG/ML IJ SOLN
1.0000 mg | Freq: Once | INTRAMUSCULAR | Status: AC
  Administered 2018-12-09: 1 mg via INTRAVENOUS
  Filled 2018-12-09: qty 1

## 2018-12-09 MED ORDER — ACETAMINOPHEN 325 MG PO TABS: 650.0000 mg | ORAL_TABLET | Freq: Four times a day (QID) | ORAL | Status: DC | PRN

## 2018-12-09 MED ORDER — VITAMIN D 25 MCG (1000 UNIT) PO TABS
1000.0000 [IU] | ORAL_TABLET | Freq: Every day | ORAL | Status: DC
  Administered 2018-12-10 – 2018-12-12 (×3): 1000 [IU] via ORAL
  Filled 2018-12-09 (×3): qty 1

## 2018-12-09 MED ORDER — FERROUS SULFATE 325 (65 FE) MG PO TABS
325.0000 mg | ORAL_TABLET | Freq: Every day | ORAL | Status: DC
  Administered 2018-12-10 – 2018-12-12 (×3): 325 mg via ORAL
  Filled 2018-12-09 (×3): qty 1

## 2018-12-09 MED ORDER — POTASSIUM CHLORIDE CRYS ER 20 MEQ PO TBCR
40.0000 meq | EXTENDED_RELEASE_TABLET | Freq: Once | ORAL | Status: AC
  Administered 2018-12-09: 19:00:00 40 meq via ORAL
  Filled 2018-12-09: qty 2

## 2018-12-09 MED ORDER — SODIUM CHLORIDE 0.9 % IV BOLUS
500.0000 mL | Freq: Once | INTRAVENOUS | Status: AC
  Administered 2018-12-09: 19:00:00 500 mL via INTRAVENOUS

## 2018-12-09 MED ORDER — METOPROLOL SUCCINATE ER 50 MG PO TB24: 50.0000 mg | ORAL_TABLET | Freq: Every day | ORAL | Status: DC

## 2018-12-09 MED ORDER — ONDANSETRON HCL 4 MG/2ML IJ SOLN: 4.0000 mg | Freq: Four times a day (QID) | INTRAMUSCULAR | Status: DC | PRN

## 2018-12-09 MED ORDER — TRAMADOL HCL 50 MG PO TABS
50.0000 mg | ORAL_TABLET | Freq: Four times a day (QID) | ORAL | Status: DC | PRN
  Administered 2018-12-10: 50 mg via ORAL
  Filled 2018-12-09: qty 1

## 2018-12-09 MED ORDER — TRAZODONE HCL 50 MG PO TABS
25.0000 mg | ORAL_TABLET | Freq: Three times a day (TID) | ORAL | Status: DC | PRN
  Administered 2018-12-10 (×2): 25 mg via ORAL
  Filled 2018-12-09 (×2): qty 1

## 2018-12-09 MED ORDER — ACETAMINOPHEN 650 MG RE SUPP: 650.0000 mg | Freq: Four times a day (QID) | RECTAL | Status: DC | PRN

## 2018-12-09 MED ORDER — HEPARIN SODIUM (PORCINE) 5000 UNIT/ML IJ SOLN
5000.0000 [IU] | Freq: Three times a day (TID) | INTRAMUSCULAR | Status: DC
  Administered 2018-12-10 – 2018-12-12 (×6): 5000 [IU] via SUBCUTANEOUS
  Filled 2018-12-09 (×6): qty 1

## 2018-12-09 MED ORDER — ONDANSETRON HCL 4 MG PO TABS: 4.0000 mg | ORAL_TABLET | Freq: Four times a day (QID) | ORAL | Status: DC | PRN

## 2018-12-09 NOTE — ED Notes (Signed)
Daughter- Hilda Blades called, would like an update at [phone]

## 2018-12-09 NOTE — ED Provider Notes (Signed)
[facility] EMERGENCY DEPARTMENT Provider Note   CSN: [id number] Arrival date & time: 12/09/18  1621     History   Chief Complaint Chief Complaint  Patient presents with   Fall    HPI Shirley Wright is a 85 y.o. female.     Patient s/p fall at [facility]. Hx falls, was seen in ED yesterday after falling and hitting head then. Patient indicates she was walking and lost balance. Denies loc. No headache. Has been off anticoag therapy for 6 days. Denies neck or back pain. Denies extremity pain or injury. Patient states she feels fine. No numbness/weakness. No change in speech or vision. No fever or chills. No cough or uri symptoms.   The history is provided by the patient.  Fall Pertinent negatives include no chest pain, no abdominal pain, no headaches and no shortness of breath.    Past Medical History:  Diagnosis Date   Adenomatous colon polyp    tubular   Allergic rhinitis    Diverticulosis    GERD (gastroesophageal reflux disease)    Hemorrhoids    Hepatic cyst    History of colonic polyps    Hyperlipidemia    Hypertension    IBS (irritable bowel syndrome)    Osteoarthritis     Patient Active Problem List   Diagnosis Date Noted   Acute deep vein thrombosis (DVT) of left lower extremity ([place]) 07/15/2018   DVT (deep venous thrombosis) ([place]) 07/14/2018   Onychomycosis 02/20/2018   Memory loss 02/20/2018   GERD 04/16/2007   IRRITABLE BOWEL SYNDROME 04/16/2007   Osteoarthritis 04/16/2007   BACK PAIN, LUMBAR 04/16/2007   COLONIC POLYPS, HX OF 04/16/2007   Dyslipidemia 01/27/2007   Essential hypertension 01/27/2007   ALLERGIC RHINITIS 01/27/2007    Past Surgical History:  Procedure Laterality Date   benign breast biopsy     COLONOSCOPY  06 13 11   Dr Earlean Shawl,  benign polyp, diverticulosis, no repeats recommended   ganglion cyst removed     left thumba   LUMBAR FUSION     L3-4 and L4-5 per Dr. Carloyn Manner 2002    TONSILLECTOMY     VAGINAL HYSTERECTOMY     with ovaries intact     OB History   No obstetric history on file.      Home Medications    Prior to Admission medications   Medication Sig Start Date End Date Taking? Authorizing Provider  acetaminophen (TYLENOL) 325 MG tablet Take 325 mg by mouth 3 (three) times daily.     [provider]  acetaminophen (TYLENOL) 325 MG tablet Take 650 mg by mouth every 6 (six) hours as needed for mild pain or headache.    [provider]  apixaban (ELIQUIS) 5 MG TABS tablet Take 2 tablets (10 mg total) by mouth 2 (two) times daily for 5 days. Start 5mg  BID dosing after completing 10mg  BID dosing 07/17/18 07/22/18  Donne Shawntina, MD  apixaban (ELIQUIS) 5 MG TABS tablet Take 1 tablet (5 mg total) by mouth 2 (two) times daily for 30 days. Start 5mg  BID dosing after completing 10mg  BID dosing 11/14/18 12/14/18  Laurey Morale, MD  cholecalciferol (VITAMIN D3) 25 MCG (1000 UT) tablet Take 1,000 Units by mouth daily.    [provider]  DULoxetine (CYMBALTA) 30 MG capsule Take 30 mg by mouth daily.    [provider]  ferrous sulfate 325 (65 FE) MG tablet Take 325 mg by mouth daily with breakfast.  [provider]  metoprolol succinate (TOPROL-XL) 50 MG 24 hr tablet Take 50 mg by mouth daily. Take with or immediately following a meal.    [provider]  metoprolol tartrate (LOPRESSOR) 50 MG tablet TAKE 1 BY MOUTH DAILY Patient not taking: Reported on 12/08/2018 05/15/18   Laurey Morale, MD  potassium chloride (KLOR-CON 10) 10 MEQ tablet Take 1 tablet (10 mEq total) by mouth daily. 05/15/18   Laurey Morale, MD  traMADol (ULTRAM) 50 MG tablet Take 50 mg by mouth every 6 (six) hours as needed for moderate pain.  09/29/18   [provider]  traZODone (DESYREL) 50 MG tablet Take 25 mg by mouth every 8 (eight) hours as needed (for anxiety).    [provider]    Family History Family History    Problem Relation Age of Onset   Arthritis Other    Diabetes Other    Hypertension Other    Lung cancer Other    Heart disease Other     Social History Social History   Tobacco Use   Smoking status: Never Smoker   Smokeless tobacco: Never Used  Substance Use Topics   Alcohol use: No    Alcohol/week: 0.0 standard drinks    Comment: once every 2-3 months   Drug use: No     Allergies   Penicillins, Pneumococcal vaccine polyvalent, and Tetanus-diphtheria toxoids td   Review of Systems Review of Systems  Constitutional: Negative for chills and fever.  HENT: Negative for sore throat.   Eyes: Negative for redness.  Respiratory: Negative for cough and shortness of breath.   Cardiovascular: Negative for chest pain.  Gastrointestinal: Negative for abdominal pain, nausea and vomiting.  Genitourinary: Negative for flank pain.  Musculoskeletal: Negative for back pain and neck pain.  Skin: Negative for rash.  Neurological: Negative for speech difficulty, weakness, numbness and headaches.  Hematological: Does not bruise/bleed easily.  Psychiatric/Behavioral: Negative for confusion.     Physical Exam Updated Vital Signs BP (!) 161/62 (BP Location: Right Arm)    Pulse 73    Temp 98.4 F (36.9 C) (Oral)    Resp 16    SpO2 96%   Physical Exam Vitals signs and nursing note reviewed.  Constitutional:      Appearance: Normal appearance. She is well-developed.  HENT:     Head: Atraumatic.     Nose: Nose normal.     Mouth/Throat:     Mouth: Mucous membranes are moist.  Eyes:     General: No scleral icterus.    Conjunctiva/sclera: Conjunctivae normal.     Pupils: Pupils are equal, round, and reactive to light.  Neck:     Musculoskeletal: Normal range of motion and neck supple. No neck rigidity or muscular tenderness.     Trachea: No tracheal deviation.  Cardiovascular:     Rate and Rhythm: Normal rate and regular rhythm.     Pulses: Normal pulses.     Heart sounds:  Normal heart sounds. No murmur. No friction rub. No gallop.   Pulmonary:     Effort: Pulmonary effort is normal. No respiratory distress.     Breath sounds: Normal breath sounds.  Abdominal:     General: Bowel sounds are normal. There is no distension.     Palpations: Abdomen is soft.     Tenderness: There is no abdominal tenderness. There is no guarding.  Genitourinary:    Comments: No cva tenderness. Dark brown stool, heme neg.  Musculoskeletal:  General: No swelling.     Comments: CTLS spine, non tender, aligned, no step off. Good rom bil ext without pain or focal bony tenderness.   Skin:    General: Skin is warm and dry.     Findings: No rash.  Neurological:     Mental Status: She is alert.     Comments: Alert, speech normal/fluent. Motor intact bil, stre 5/5. sens grossly intact bil.   Psychiatric:        Mood and Affect: Mood normal.      ED Treatments / Results  Labs (all labs ordered are listed, but only abnormal results are displayed) Results for orders placed or performed during the hospital encounter of 25/36/64  Basic metabolic panel  Result Value Ref Range   Sodium 130 (L) 135 - 145 mmol/L   Potassium 3.4 (L) 3.5 - 5.1 mmol/L   Chloride 95 (L) 98 - 111 mmol/L   CO2 23 22 - 32 mmol/L   Glucose, Bld 145 (H) 70 - 99 mg/dL   BUN 21 8 - 23 mg/dL   Creatinine, Ser 1.23 (H) 0.44 - 1.00 mg/dL   Calcium 9.4 8.9 - 10.3 mg/dL   GFR calc non Af Amer 38 (L) >60 mL/min   GFR calc Af Amer 44 (L) >60 mL/min   Anion gap 12 5 - 15  CBC  Result Value Ref Range   WBC 11.3 (H) 4.0 - 10.5 K/uL   RBC 4.08 3.87 - 5.11 MIL/uL   Hemoglobin 8.5 (L) 12.0 - 15.0 g/dL   HCT 31.4 (L) 36.0 - 46.0 %   MCV 77.0 (L) 80.0 - 100.0 fL   MCH 20.8 (L) 26.0 - 34.0 pg   MCHC 27.1 (L) 30.0 - 36.0 g/dL   RDW 18.9 (H) 11.5 - 15.5 %   Platelets 311 150 - 400 K/uL   nRBC 0.0 0.0 - 0.2 %  POC occult blood, ED Provider will collect  Result Value Ref Range   Fecal Occult Bld NEGATIVE  NEGATIVE   Ct Head Wo Contrast  Result Date: 12/08/2018 CLINICAL DATA:  Right head lacerations and soft tissue swelling following a fall today. EXAM: CT HEAD WITHOUT CONTRAST CT CERVICAL SPINE WITHOUT CONTRAST TECHNIQUE: Multidetector CT imaging of the head and cervical spine was performed following the standard protocol without intravenous contrast. Multiplanar CT image reconstructions of the cervical spine were also generated. COMPARISON:  None. FINDINGS: CT HEAD FINDINGS Brain: Mild-to-moderate enlargement of the ventricles and subarachnoid spaces. Mild-to-moderate patchy white matter low density in both cerebral hemispheres. No intracranial hemorrhage, mass lesion or CT evidence of acute infarction. Vascular: No hyperdense vessel or unexpected calcification. Skull: Bilateral hyperostosis frontalis. 2.2 x 1.1 cm oval, homogeneously densely calcified mass arising from the anterior aspect of the right frontal bone with a broad base against the skull. No skull fractures are seen. Sinuses/Orbits: Status post bilateral cataract extraction. Unremarkable bones and included paranasal sinuses. Other: Right posterolateral scalp hematoma. CT CERVICAL SPINE FINDINGS Alignment: Mild reversal the normal lordosis in the mid to lower cervical spine. Mild dextroconvex cervicothoracic scoliosis. Mild retrolisthesis at the C4-5 and C5-6 levels. Skull base and vertebrae: No acute fracture. No primary bone lesion or focal pathologic process. Soft tissues and spinal canal: No prevertebral fluid or swelling. No visible canal hematoma. Disc levels: Multilevel degenerative changes, most pronounced at the C4-5, C5-6 and C6-7 levels. Upper chest: Clear lung apices. Other: None. IMPRESSION: 1. Right posterolateral scalp hematoma without skull fracture or intracranial hemorrhage. 2. No cervical  spine fracture or traumatic subluxation. 3. Mild to moderate diffuse cerebral and cerebellar atrophy. 4. Mild to moderate chronic small vessel  white matter ischemic changes in both cerebral hemispheres. 5. 2.2 x 1.1 cm right frontal old, calcified cephalohematoma or exostosis. 6. Multilevel cervical spine degenerative changes. Electronically Signed   By: Claudie Revering M.D.   On: 12/08/2018 09:41   Ct Cervical Spine Wo Contrast  Result Date: 12/08/2018 CLINICAL DATA:  Right head lacerations and soft tissue swelling following a fall today. EXAM: CT HEAD WITHOUT CONTRAST CT CERVICAL SPINE WITHOUT CONTRAST TECHNIQUE: Multidetector CT imaging of the head and cervical spine was performed following the standard protocol without intravenous contrast. Multiplanar CT image reconstructions of the cervical spine were also generated. COMPARISON:  None. FINDINGS: CT HEAD FINDINGS Brain: Mild-to-moderate enlargement of the ventricles and subarachnoid spaces. Mild-to-moderate patchy white matter low density in both cerebral hemispheres. No intracranial hemorrhage, mass lesion or CT evidence of acute infarction. Vascular: No hyperdense vessel or unexpected calcification. Skull: Bilateral hyperostosis frontalis. 2.2 x 1.1 cm oval, homogeneously densely calcified mass arising from the anterior aspect of the right frontal bone with a broad base against the skull. No skull fractures are seen. Sinuses/Orbits: Status post bilateral cataract extraction. Unremarkable bones and included paranasal sinuses. Other: Right posterolateral scalp hematoma. CT CERVICAL SPINE FINDINGS Alignment: Mild reversal the normal lordosis in the mid to lower cervical spine. Mild dextroconvex cervicothoracic scoliosis. Mild retrolisthesis at the C4-5 and C5-6 levels. Skull base and vertebrae: No acute fracture. No primary bone lesion or focal pathologic process. Soft tissues and spinal canal: No prevertebral fluid or swelling. No visible canal hematoma. Disc levels: Multilevel degenerative changes, most pronounced at the C4-5, C5-6 and C6-7 levels. Upper chest: Clear lung apices. Other: None.  IMPRESSION: 1. Right posterolateral scalp hematoma without skull fracture or intracranial hemorrhage. 2. No cervical spine fracture or traumatic subluxation. 3. Mild to moderate diffuse cerebral and cerebellar atrophy. 4. Mild to moderate chronic small vessel white matter ischemic changes in both cerebral hemispheres. 5. 2.2 x 1.1 cm right frontal old, calcified cephalohematoma or exostosis. 6. Multilevel cervical spine degenerative changes. Electronically Signed   By: Claudie Revering M.D.   On: 12/08/2018 09:41    EKG None  Radiology Ct Head Wo Contrast  Result Date: 12/08/2018 CLINICAL DATA:  Right head lacerations and soft tissue swelling following a fall today. EXAM: CT HEAD WITHOUT CONTRAST CT CERVICAL SPINE WITHOUT CONTRAST TECHNIQUE: Multidetector CT imaging of the head and cervical spine was performed following the standard protocol without intravenous contrast. Multiplanar CT image reconstructions of the cervical spine were also generated. COMPARISON:  None. FINDINGS: CT HEAD FINDINGS Brain: Mild-to-moderate enlargement of the ventricles and subarachnoid spaces. Mild-to-moderate patchy white matter low density in both cerebral hemispheres. No intracranial hemorrhage, mass lesion or CT evidence of acute infarction. Vascular: No hyperdense vessel or unexpected calcification. Skull: Bilateral hyperostosis frontalis. 2.2 x 1.1 cm oval, homogeneously densely calcified mass arising from the anterior aspect of the right frontal bone with a broad base against the skull. No skull fractures are seen. Sinuses/Orbits: Status post bilateral cataract extraction. Unremarkable bones and included paranasal sinuses. Other: Right posterolateral scalp hematoma. CT CERVICAL SPINE FINDINGS Alignment: Mild reversal the normal lordosis in the mid to lower cervical spine. Mild dextroconvex cervicothoracic scoliosis. Mild retrolisthesis at the C4-5 and C5-6 levels. Skull base and vertebrae: No acute fracture. No primary bone  lesion or focal pathologic process. Soft tissues and spinal canal: No prevertebral fluid or swelling. No  visible canal hematoma. Disc levels: Multilevel degenerative changes, most pronounced at the C4-5, C5-6 and C6-7 levels. Upper chest: Clear lung apices. Other: None. IMPRESSION: 1. Right posterolateral scalp hematoma without skull fracture or intracranial hemorrhage. 2. No cervical spine fracture or traumatic subluxation. 3. Mild to moderate diffuse cerebral and cerebellar atrophy. 4. Mild to moderate chronic small vessel white matter ischemic changes in both cerebral hemispheres. 5. 2.2 x 1.1 cm right frontal old, calcified cephalohematoma or exostosis. 6. Multilevel cervical spine degenerative changes. Electronically Signed   By: Claudie Revering M.D.   On: 12/08/2018 09:41   Ct Cervical Spine Wo Contrast  Result Date: 12/08/2018 CLINICAL DATA:  Right head lacerations and soft tissue swelling following a fall today. EXAM: CT HEAD WITHOUT CONTRAST CT CERVICAL SPINE WITHOUT CONTRAST TECHNIQUE: Multidetector CT imaging of the head and cervical spine was performed following the standard protocol without intravenous contrast. Multiplanar CT image reconstructions of the cervical spine were also generated. COMPARISON:  None. FINDINGS: CT HEAD FINDINGS Brain: Mild-to-moderate enlargement of the ventricles and subarachnoid spaces. Mild-to-moderate patchy white matter low density in both cerebral hemispheres. No intracranial hemorrhage, mass lesion or CT evidence of acute infarction. Vascular: No hyperdense vessel or unexpected calcification. Skull: Bilateral hyperostosis frontalis. 2.2 x 1.1 cm oval, homogeneously densely calcified mass arising from the anterior aspect of the right frontal bone with a broad base against the skull. No skull fractures are seen. Sinuses/Orbits: Status post bilateral cataract extraction. Unremarkable bones and included paranasal sinuses. Other: Right posterolateral scalp hematoma. CT  CERVICAL SPINE FINDINGS Alignment: Mild reversal the normal lordosis in the mid to lower cervical spine. Mild dextroconvex cervicothoracic scoliosis. Mild retrolisthesis at the C4-5 and C5-6 levels. Skull base and vertebrae: No acute fracture. No primary bone lesion or focal pathologic process. Soft tissues and spinal canal: No prevertebral fluid or swelling. No visible canal hematoma. Disc levels: Multilevel degenerative changes, most pronounced at the C4-5, C5-6 and C6-7 levels. Upper chest: Clear lung apices. Other: None. IMPRESSION: 1. Right posterolateral scalp hematoma without skull fracture or intracranial hemorrhage. 2. No cervical spine fracture or traumatic subluxation. 3. Mild to moderate diffuse cerebral and cerebellar atrophy. 4. Mild to moderate chronic small vessel white matter ischemic changes in both cerebral hemispheres. 5. 2.2 x 1.1 cm right frontal old, calcified cephalohematoma or exostosis. 6. Multilevel cervical spine degenerative changes. Electronically Signed   By: Claudie Revering M.D.   On: 12/08/2018 09:41    Procedures Procedures (including critical care time)  Medications Ordered in ED Medications - No data to display   Initial Impression / Assessment and Plan / ED Course  I have reviewed the triage vital signs and the nursing notes.  Pertinent labs & imaging results that were available during my care of the patient were reviewed by me and considered in my medical decision making (see chart for details).  Reviewed nursing notes and prior charts for additional history. Reviewed head and neck ct from yesterday - neg acute. Today, no loc, denies headache, no nv. Spine nt.   Given recent frequent falls, will send labs.   Labs reviewed by me - hgb low. Pt denies hx fe def anemia, denies recent blood loss or melena. Anemia panel added. Stool heme neg.   Given frequent falls, 2 ED visits in 2 days, ?symptomatic anemia, hyponatremia, mild aki - will admit/obs.  UA pending.    Hospitalists consulted for admission.    Final Clinical Impressions(s) / ED Diagnoses   Final diagnoses:  None  ED Discharge Orders    None       Lajean Saver, MD 12/09/18 1911

## 2018-12-09 NOTE — ED Triage Notes (Signed)
Pt to ED via [facility] after reported having a fall.  Staff at [facility] she has fallen x's 2 today  Pt has small lace to right eyebrow which has been steri stripped.   Pt has dementia at baseline.

## 2018-12-09 NOTE — ED Notes (Signed)
On arrival pt had c-collar on.  C-collar removed by Dr. Ashok Cordia

## 2018-12-09 NOTE — H&P (Signed)
History and Physical    Shirley Wright WUX:[id number] DOB: Nov 12, 1925 DOA: 12/09/2018  Referring MD/NP/PA:   PCP: Laurey Morale, MD   Patient coming from:  The patient is coming from SNF.  At baseline, pt is dependent for most of ADL.        Chief Complaint: fall   HPI: Shirley Wright is a 85 y.o. female with medical history significant of hypertension, hyperlipidemia, GERD, depression, back pain, left leg DVT (was on Eliquis, which was stopped on 12/05/2018), dementia, pacemaker placement ? (per patient's report), who presents with fall.  Per report, pt was seen in ED yesterday after falling and hitting head in SNF. Pt states that she fell when she was walking and lost her balance. No LOC.  She denies unilateral weakness, numbness or tingling his extremities.  No facial droop, slurred speech, difficulty speaking, vision loss or hearing loss. She had Ct-head and CT of C spin yesterday which were negative for acute intracranial abnormalities or bony fracture. She was discharged back to SNF. She fell again twice today. Pt has small lace to right eyebrow which has been steri stripped.   Pt has dementia at baseline, but she is oriented x3.  Answers most questions appropriately.  She denies any chest pain, shortness of breath, cough, fever or chills.  No nausea vomiting, diarrhea, abdominal pain, symptoms of UTI.   Patient denies rectal bleeding or dark stool.  ED Course: pt was found to have hemoglobin 8.5 (9.6 on 07/15/2018), WBC 11.3, pending COVID-19 test, negative FOBT test, potassium 3.4, sodium 130, AKI with creatinine 1.23, BUN 21, temperature normal, blood pressure 166/67, HR 73, oxygen sat 92 to 99% on room air.  Patient is placed on telemetry bed for observation.  Review of Systems:   General: no fevers, chills, no body weight gain, has fatigue HEENT: no blurry vision, hearing changes or sore throat Respiratory: no dyspnea, coughing, wheezing CV: no chest pain, no palpitations GI:  no nausea, vomiting, abdominal pain, diarrhea, constipation GU: no dysuria, burning on urination, increased urinary frequency, hematuria  Ext: no leg edema Neuro: no unilateral weakness, numbness, or tingling, no vision change or hearing loss. Has fall. Skin: no rash. Pt has small lace to right eyebrow  MSK: No muscle spasm, no deformity, no limitation of range of movement in spin Heme: No easy bruising.  Travel history: No recent long distant travel.  Allergy:  Allergies  Allergen Reactions   Penicillins Other (See Comments)    "Allergic," per [facility] Did it involve swelling of the face/tongue/throat, SOB, or low BP? Unk Did it involve sudden or severe rash/hives, skin peeling, or any reaction on the inside of your mouth or nose? Unk Did you need to seek medical attention at a hospital or doctor's office? Unk When did it last happen? Unk If all above answers are "NO", may proceed with cephalosporin use.    Pneumococcal Vaccine Polyvalent Other (See Comments)    "Allergic," per [facility]   Tetanus-Diphtheria Toxoids Td Other (See Comments)    "Allergic," per [facility]    Past Medical History:  Diagnosis Date   Adenomatous colon polyp    tubular   Allergic rhinitis    Diverticulosis    GERD (gastroesophageal reflux disease)    Hemorrhoids    Hepatic cyst    History of colonic polyps    Hyperlipidemia    Hypertension    IBS (irritable bowel syndrome)    Osteoarthritis     Past Surgical History:  Procedure Laterality Date   benign breast biopsy     COLONOSCOPY  06 13 11   Dr Earlean Shawl,  benign polyp, diverticulosis, no repeats recommended   ganglion cyst removed     left thumba   LUMBAR FUSION     L3-4 and L4-5 per Dr. Carloyn Manner 2002   TONSILLECTOMY     VAGINAL HYSTERECTOMY     with ovaries intact    Social History:  reports that she has never smoked. She has never used smokeless tobacco. She reports that she does not drink alcohol or use drugs.  Family History:    Family History  Problem Relation Age of Onset   Arthritis Other    Diabetes Other    Hypertension Other    Lung cancer Other    Heart disease Other      Prior to Admission medications   Medication Sig Start Date End Date Taking? Authorizing Provider  acetaminophen (TYLENOL) 325 MG tablet Take 325 mg by mouth 3 (three) times daily.     [provider]  acetaminophen (TYLENOL) 325 MG tablet Take 650 mg by mouth every 6 (six) hours as needed for mild pain or headache.    [provider]  apixaban (ELIQUIS) 5 MG TABS tablet Take 2 tablets (10 mg total) by mouth 2 (two) times daily for 5 days. Start 5mg  BID dosing after completing 10mg  BID dosing 07/17/18 07/22/18  Donne Aleesha, MD  apixaban (ELIQUIS) 5 MG TABS tablet Take 1 tablet (5 mg total) by mouth 2 (two) times daily for 30 days. Start 5mg  BID dosing after completing 10mg  BID dosing 11/14/18 12/14/18  Laurey Morale, MD  cholecalciferol (VITAMIN D3) 25 MCG (1000 UT) tablet Take 1,000 Units by mouth daily.    [provider]  DULoxetine (CYMBALTA) 30 MG capsule Take 30 mg by mouth daily.    [provider]  ferrous sulfate 325 (65 FE) MG tablet Take 325 mg by mouth daily with breakfast.    [provider]  metoprolol succinate (TOPROL-XL) 50 MG 24 hr tablet Take 50 mg by mouth daily. Take with or immediately following a meal.    [provider]  metoprolol tartrate (LOPRESSOR) 50 MG tablet TAKE 1 BY MOUTH DAILY Patient not taking: Reported on 12/08/2018 05/15/18   Laurey Morale, MD  potassium chloride (KLOR-CON 10) 10 MEQ tablet Take 1 tablet (10 mEq total) by mouth daily. 05/15/18   Laurey Morale, MD  traMADol (ULTRAM) 50 MG tablet Take 50 mg by mouth every 6 (six) hours as needed for moderate pain.  09/29/18   [provider]  traZODone (DESYREL) 50 MG tablet Take 25 mg by mouth every 8 (eight) hours as needed (for anxiety).    [provider]    Physical  Exam: Vitals:   12/09/18 1639 12/09/18 1845  BP: (!) 161/62 (!) 166/67  Pulse: 73 73  Resp: 16 18  Temp: 98.4 F (36.9 C)   TempSrc: Oral   SpO2: 96% 92%   General: Not in acute distress HEENT:       Eyes: PERRL, EOMI, no scleral icterus.       ENT: No discharge from the ears and nose, no pharynx injection, no tonsillar enlargement.        Neck: No JVD, no bruit, no mass felt. Heme: No neck lymph node enlargement. Cardiac: S1/S2, RRR, No murmurs, No gallops or rubs. Respiratory: No rales, wheezing, rhonchi or rubs. GI: Soft, nondistended, nontender, no rebound  pain, no organomegaly, BS present. GU: No hematuria Ext: No pitting leg edema bilaterally. 2+DP/PT pulse bilaterally. Musculoskeletal: No joint deformities, No joint redness or warmth, no limitation of ROM in spin. Skin: No rashes. Pt has small lace to right eyebrow  Neuro: Alert, oriented X3, cranial nerves II-XII grossly intact, moves all extremities normally. Psych: Patient is not psychotic, no suicidal or hemocidal ideation.  Labs on Admission: I have personally reviewed following labs and imaging studies  CBC: Recent Labs  Lab 12/09/18 1648  WBC 11.3*  HGB 8.5*  HCT 31.4*  MCV 77.0*  PLT 941   Basic Metabolic Panel: Recent Labs  Lab 12/09/18 1648  NA 130*  K 3.4*  CL 95*  CO2 23  GLUCOSE 145*  BUN 21  CREATININE 1.23*  CALCIUM 9.4   GFR: CrCl cannot be calculated (Unknown ideal weight.). Liver Function Tests: No results for input(s): AST, ALT, ALKPHOS, BILITOT, PROT, ALBUMIN in the last 168 hours. No results for input(s): LIPASE, AMYLASE in the last 168 hours. No results for input(s): AMMONIA in the last 168 hours. Coagulation Profile: No results for input(s): INR, PROTIME in the last 168 hours. Cardiac Enzymes: No results for input(s): CKTOTAL, CKMB, CKMBINDEX, TROPONINI in the last 168 hours. BNP (last 3 results) No results for input(s): PROBNP in the last 8760 hours. HbA1C: No results for  input(s): HGBA1C in the last 72 hours. CBG: No results for input(s): GLUCAP in the last 168 hours. Lipid Profile: No results for input(s): CHOL, HDL, LDLCALC, TRIG, CHOLHDL, LDLDIRECT in the last 72 hours. Thyroid Function Tests: No results for input(s): TSH, T4TOTAL, FREET4, T3FREE, THYROIDAB in the last 72 hours. Anemia Panel: Recent Labs    12/09/18 1845  VITAMINB12 437  FOLATE 13.5  FERRITIN 52  TIBC 448  IRON 17*  RETICCTPCT 2.5   Urine analysis:    Component Value Date/Time   COLORURINE yellow 01/17/2009 0942   APPEARANCEUR Clear 01/17/2009 0942   LABSPEC 1.015 01/17/2009 0942   PHURINE 7.0 01/17/2009 0942   HGBUR negative 01/17/2009 0942   BILIRUBINUR n 05/15/2018 1209   PROTEINUR Negative 05/15/2018 1209   UROBILINOGEN 0.2 05/15/2018 1209   UROBILINOGEN 0.2 01/17/2009 0942   NITRITE n 05/15/2018 1209   NITRITE negative 01/17/2009 0942   LEUKOCYTESUR Negative 05/15/2018 1209   Sepsis Labs: @LABRCNTIP (procalcitonin:4,lacticidven:4) )No results found for this or any previous visit (from the past 240 hour(s)).   Radiological Exams on Admission: Ct Head Wo Contrast  Result Date: 12/08/2018 CLINICAL DATA:  Right head lacerations and soft tissue swelling following a fall today. EXAM: CT HEAD WITHOUT CONTRAST CT CERVICAL SPINE WITHOUT CONTRAST TECHNIQUE: Multidetector CT imaging of the head and cervical spine was performed following the standard protocol without intravenous contrast. Multiplanar CT image reconstructions of the cervical spine were also generated. COMPARISON:  None. FINDINGS: CT HEAD FINDINGS Brain: Mild-to-moderate enlargement of the ventricles and subarachnoid spaces. Mild-to-moderate patchy white matter low density in both cerebral hemispheres. No intracranial hemorrhage, mass lesion or CT evidence of acute infarction. Vascular: No hyperdense vessel or unexpected calcification. Skull: Bilateral hyperostosis frontalis. 2.2 x 1.1 cm oval, homogeneously densely  calcified mass arising from the anterior aspect of the right frontal bone with a broad base against the skull. No skull fractures are seen. Sinuses/Orbits: Status post bilateral cataract extraction. Unremarkable bones and included paranasal sinuses. Other: Right posterolateral scalp hematoma. CT CERVICAL SPINE FINDINGS Alignment: Mild reversal the normal lordosis in the mid to lower cervical spine. Mild dextroconvex cervicothoracic scoliosis. Mild  retrolisthesis at the C4-5 and C5-6 levels. Skull base and vertebrae: No acute fracture. No primary bone lesion or focal pathologic process. Soft tissues and spinal canal: No prevertebral fluid or swelling. No visible canal hematoma. Disc levels: Multilevel degenerative changes, most pronounced at the C4-5, C5-6 and C6-7 levels. Upper chest: Clear lung apices. Other: None. IMPRESSION: 1. Right posterolateral scalp hematoma without skull fracture or intracranial hemorrhage. 2. No cervical spine fracture or traumatic subluxation. 3. Mild to moderate diffuse cerebral and cerebellar atrophy. 4. Mild to moderate chronic small vessel white matter ischemic changes in both cerebral hemispheres. 5. 2.2 x 1.1 cm right frontal old, calcified cephalohematoma or exostosis. 6. Multilevel cervical spine degenerative changes. Electronically Signed   By: Claudie Revering M.D.   On: 12/08/2018 09:41   Ct Cervical Spine Wo Contrast  Result Date: 12/08/2018 CLINICAL DATA:  Right head lacerations and soft tissue swelling following a fall today. EXAM: CT HEAD WITHOUT CONTRAST CT CERVICAL SPINE WITHOUT CONTRAST TECHNIQUE: Multidetector CT imaging of the head and cervical spine was performed following the standard protocol without intravenous contrast. Multiplanar CT image reconstructions of the cervical spine were also generated. COMPARISON:  None. FINDINGS: CT HEAD FINDINGS Brain: Mild-to-moderate enlargement of the ventricles and subarachnoid spaces. Mild-to-moderate patchy white matter low  density in both cerebral hemispheres. No intracranial hemorrhage, mass lesion or CT evidence of acute infarction. Vascular: No hyperdense vessel or unexpected calcification. Skull: Bilateral hyperostosis frontalis. 2.2 x 1.1 cm oval, homogeneously densely calcified mass arising from the anterior aspect of the right frontal bone with a broad base against the skull. No skull fractures are seen. Sinuses/Orbits: Status post bilateral cataract extraction. Unremarkable bones and included paranasal sinuses. Other: Right posterolateral scalp hematoma. CT CERVICAL SPINE FINDINGS Alignment: Mild reversal the normal lordosis in the mid to lower cervical spine. Mild dextroconvex cervicothoracic scoliosis. Mild retrolisthesis at the C4-5 and C5-6 levels. Skull base and vertebrae: No acute fracture. No primary bone lesion or focal pathologic process. Soft tissues and spinal canal: No prevertebral fluid or swelling. No visible canal hematoma. Disc levels: Multilevel degenerative changes, most pronounced at the C4-5, C5-6 and C6-7 levels. Upper chest: Clear lung apices. Other: None. IMPRESSION: 1. Right posterolateral scalp hematoma without skull fracture or intracranial hemorrhage. 2. No cervical spine fracture or traumatic subluxation. 3. Mild to moderate diffuse cerebral and cerebellar atrophy. 4. Mild to moderate chronic small vessel white matter ischemic changes in both cerebral hemispheres. 5. 2.2 x 1.1 cm right frontal old, calcified cephalohematoma or exostosis. 6. Multilevel cervical spine degenerative changes. Electronically Signed   By: Claudie Revering M.D.   On: 12/08/2018 09:41     EKG on 12/08/18: Independently reviewed.  Sinus rhythm, QTC 471, LAD, nonspecific T wave change  Assessment/Plan Principal Problem:   Fall Active Problems:   Essential hypertension   DVT (deep venous thrombosis) ([place])   Depression   AKI (acute kidney injury) ([place])   Microcytic anemia   Hypokalemia   Hyponatremia   Fall:  Etiology is not clear, likely multifactorial etiology, including dementia, electrolyte disturbance, AKI, worsening anemia.  No focal neurologic findings on physical examination.  CT of head and CT of C-spine were negative for acute intracranial abnormalities or bony fracture.  Patient states that she has pacemaker placement?, cannot do MRI for brain.  -will please on tele bed for obs -pt/ot -check orthostatic vital signs. -gentle IVF: 500 cc NS bolus and then 75 cc/h -f/u UA  Essential hypertension: -continue metoprolol -IV hydralazine as needed  Hx of DVT (deep venous thrombosis) ([place]): completed course of Eliquis -no acute issues  Depression: -Cymbalta  AKI: Likely due to prerenal secondary to dehydration and continuation of ACEI, ARB, diuretics, NSAIDs - IVF as above - Follow up renal function by BMP - Check FeNa  - Check FeUrea - Hold  - US-renal  AKI: Cre 1.23 and BUN 21. Likely due to dehydration - IVF as above - Follow up renal function by BMP  Microcytic anemia: Hgb 9.6 on 07/15/18 -->8.5. No rectal bleeding or dark stool.  FOBT negative. -check anemia panel  Hypokalemia: K=3.4  on admission. - Repleted - Check Mg level  Hyponatremia: mild. Na 130. -IV NS as above       DVT ppx: SQ Heparin     Code Status: Full code Family Communication: None at bed side.      Disposition Plan:  Anticipate discharge back to previous SNF Consults called:  nine Admission status: Obs / tele     Date of Service 12/09/2018    [place] [facility]   If 7PM-7AM, please contact night-coverage [email or web address] Password [facility] 12/09/2018, 8:06 PM

## 2018-12-10 DIAGNOSIS — F0390 Unspecified dementia without behavioral disturbance: Secondary | ICD-10-CM | POA: Diagnosis present

## 2018-12-10 DIAGNOSIS — R55 Syncope and collapse: Secondary | ICD-10-CM | POA: Diagnosis present

## 2018-12-10 DIAGNOSIS — D649 Anemia, unspecified: Secondary | ICD-10-CM

## 2018-12-10 DIAGNOSIS — S0083XA Contusion of other part of head, initial encounter: Secondary | ICD-10-CM | POA: Diagnosis not present

## 2018-12-10 DIAGNOSIS — E860 Dehydration: Secondary | ICD-10-CM | POA: Diagnosis present

## 2018-12-10 DIAGNOSIS — Z90710 Acquired absence of both cervix and uterus: Secondary | ICD-10-CM | POA: Diagnosis not present

## 2018-12-10 DIAGNOSIS — Z86718 Personal history of other venous thrombosis and embolism: Secondary | ICD-10-CM | POA: Diagnosis not present

## 2018-12-10 DIAGNOSIS — I10 Essential (primary) hypertension: Secondary | ICD-10-CM | POA: Diagnosis present

## 2018-12-10 DIAGNOSIS — S0003XA Contusion of scalp, initial encounter: Secondary | ICD-10-CM | POA: Diagnosis present

## 2018-12-10 DIAGNOSIS — Y92129 Unspecified place in nursing home as the place of occurrence of the external cause: Secondary | ICD-10-CM | POA: Diagnosis not present

## 2018-12-10 DIAGNOSIS — D509 Iron deficiency anemia, unspecified: Secondary | ICD-10-CM | POA: Diagnosis present

## 2018-12-10 DIAGNOSIS — Z1159 Encounter for screening for other viral diseases: Secondary | ICD-10-CM | POA: Diagnosis not present

## 2018-12-10 DIAGNOSIS — I951 Orthostatic hypotension: Secondary | ICD-10-CM | POA: Diagnosis present

## 2018-12-10 DIAGNOSIS — K219 Gastro-esophageal reflux disease without esophagitis: Secondary | ICD-10-CM | POA: Diagnosis present

## 2018-12-10 DIAGNOSIS — Z79899 Other long term (current) drug therapy: Secondary | ICD-10-CM | POA: Diagnosis not present

## 2018-12-10 DIAGNOSIS — E871 Hypo-osmolality and hyponatremia: Secondary | ICD-10-CM | POA: Diagnosis present

## 2018-12-10 DIAGNOSIS — F329 Major depressive disorder, single episode, unspecified: Secondary | ICD-10-CM | POA: Diagnosis present

## 2018-12-10 DIAGNOSIS — Z7901 Long term (current) use of anticoagulants: Secondary | ICD-10-CM | POA: Diagnosis not present

## 2018-12-10 DIAGNOSIS — N179 Acute kidney failure, unspecified: Secondary | ICD-10-CM | POA: Diagnosis present

## 2018-12-10 DIAGNOSIS — E785 Hyperlipidemia, unspecified: Secondary | ICD-10-CM | POA: Diagnosis present

## 2018-12-10 DIAGNOSIS — W19XXXA Unspecified fall, initial encounter: Secondary | ICD-10-CM | POA: Diagnosis present

## 2018-12-10 DIAGNOSIS — E876 Hypokalemia: Secondary | ICD-10-CM | POA: Diagnosis present

## 2018-12-10 DIAGNOSIS — M25551 Pain in right hip: Secondary | ICD-10-CM | POA: Diagnosis present

## 2018-12-10 DIAGNOSIS — S0001XA Abrasion of scalp, initial encounter: Secondary | ICD-10-CM | POA: Diagnosis present

## 2018-12-10 DIAGNOSIS — S0181XA Laceration without foreign body of other part of head, initial encounter: Secondary | ICD-10-CM | POA: Diagnosis present

## 2018-12-10 DIAGNOSIS — Z66 Do not resuscitate: Secondary | ICD-10-CM | POA: Diagnosis present

## 2018-12-10 DIAGNOSIS — Z7189 Other specified counseling: Secondary | ICD-10-CM

## 2018-12-10 DIAGNOSIS — Z515 Encounter for palliative care: Secondary | ICD-10-CM

## 2018-12-10 DIAGNOSIS — R296 Repeated falls: Secondary | ICD-10-CM

## 2018-12-10 DIAGNOSIS — Z950 Presence of cardiac pacemaker: Secondary | ICD-10-CM | POA: Diagnosis not present

## 2018-12-10 LAB — URINALYSIS, ROUTINE W REFLEX MICROSCOPIC
Bilirubin Urine: NEGATIVE
Glucose, UA: NEGATIVE mg/dL
Hgb urine dipstick: NEGATIVE
Ketones, ur: NEGATIVE mg/dL
Nitrite: NEGATIVE
Protein, ur: NEGATIVE mg/dL
Specific Gravity, Urine: 1.006 (ref 1.005–1.030)
pH: 7 (ref 5.0–8.0)

## 2018-12-10 LAB — BASIC METABOLIC PANEL
Anion gap: 7 (ref 5–15)
BUN: 14 mg/dL (ref 8–23)
CO2: 25 mmol/L (ref 22–32)
Calcium: 9 mg/dL (ref 8.9–10.3)
Chloride: 102 mmol/L (ref 98–111)
Creatinine, Ser: 0.86 mg/dL (ref 0.44–1.00)
GFR calc Af Amer: >60 mL/min (ref >60)
GFR calc non Af Amer: 59 mL/min — ABNORMAL LOW (ref >60)
Glucose, Bld: 123 mg/dL — ABNORMAL HIGH (ref 70–99)
Potassium: 4 mmol/L (ref 3.5–5.1)
Sodium: 134 mmol/L — ABNORMAL LOW (ref 135–145)

## 2018-12-10 LAB — CBC
HCT: 27.3 % — ABNORMAL LOW (ref 36.0–46.0)
HCT: 28.2 % — ABNORMAL LOW (ref 36.0–46.0)
Hemoglobin: 7.7 g/dL — ABNORMAL LOW (ref 12.0–15.0)
Hemoglobin: 8 g/dL — ABNORMAL LOW (ref 12.0–15.0)
MCH: 21.5 pg — ABNORMAL LOW (ref 26.0–34.0)
MCH: 21.5 pg — ABNORMAL LOW (ref 26.0–34.0)
MCHC: 28.2 g/dL — ABNORMAL LOW (ref 30.0–36.0)
MCHC: 28.4 g/dL — ABNORMAL LOW (ref 30.0–36.0)
MCV: 76.3 fL — ABNORMAL LOW (ref 80.0–100.0)
MCV: 75.8 fL — ABNORMAL LOW (ref 80.0–100.0)
Platelets: 263 10*3/uL (ref 150–400)
Platelets: 265 10*3/uL (ref 150–400)
RBC: 3.58 MIL/uL — ABNORMAL LOW (ref 3.87–5.11)
RBC: 3.72 MIL/uL — ABNORMAL LOW (ref 3.87–5.11)
RDW: 19 % — ABNORMAL HIGH (ref 11.5–15.5)
RDW: 20 % — ABNORMAL HIGH (ref 11.5–15.5)
WBC: 7.3 10*3/uL (ref 4.0–10.5)
WBC: 7.9 10*3/uL (ref 4.0–10.5)
nRBC: 0 % (ref 0.0–0.2)
nRBC: 0 % (ref 0.0–0.2)

## 2018-12-10 LAB — HEPATIC FUNCTION PANEL
ALT: 13 U/L (ref 0–44)
AST: 19 U/L (ref 15–41)
Albumin: 3.2 g/dL — ABNORMAL LOW (ref 3.5–5.0)
Alkaline Phosphatase: 65 U/L (ref 38–126)
Bilirubin, Direct: 0.1 mg/dL (ref 0.0–0.2)
Indirect Bilirubin: 0.5 mg/dL (ref 0.3–0.9)
Total Bilirubin: 0.6 mg/dL (ref 0.3–1.2)
Total Protein: 6.5 g/dL (ref 6.5–8.1)

## 2018-12-10 LAB — MAGNESIUM: Magnesium: 1.8 mg/dL (ref 1.7–2.4)

## 2018-12-10 LAB — LACTATE DEHYDROGENASE: LDH: 149 U/L (ref 98–192)

## 2018-12-10 NOTE — Progress Notes (Signed)
Manufacturing engineer [facility]) Hospital Liaison note.  This patient was recently enrolled in our Palliative Care program. She was scheduled for her first visit but was admitted to the hospital before that took place.  [facility] will continue to follow while hospitalized. Our Palliative team will follow up with her after discharge.  If you have any questions please contact us.  Thank you,  Farrel Gordon, RN, CCM [place] (listed on [place]) [phone]

## 2018-12-10 NOTE — Progress Notes (Addendum)
Pt was received to 5W rm 11.   Pt  Is a resident of [facility]. Pt has hx of dementia and  Alert to self  and place only. Able to state she is in hospital but unable to state the name of the hospital. She was Oriented to unit and how to call for assistance. Skin assessed with the following skin concerns noted:  - laceration to R eye brow. Steri- strips in place.  - Bump to Right Frontal lobe - Scattered bruising.    To monitor and treat pt per MD and Nursing orders

## 2018-12-10 NOTE — Evaluation (Signed)
Occupational Therapy Evaluation Patient Details Name: Shirley Wright MRN: [id number] DOB: 01-Oct-1939 Today's Date: 12/10/2018    History of Present Illness Patient is a 85 y/o female presenting to the ED s/p fall. Of note, fall on 7/20 with CT of head and CT of C-spine were negative for acute intracranial abnormalities or bony fracture. Past medical history significant of hypertension, hyperlipidemia, GERD, depression, back pain, left leg DVT (was on Eliquis, which was stopped on 12/05/2018), dementia, pacemaker.    Clinical Impression   Pt admitted with above and presents to OT with impairments impacting ability to complete ADLs at [facility].  Pt with multiple falls this week, per chart and daughter report.  Pt required Min A +2 for safety due to impulsivity and decreased safety with AD.  Pt required mod assist with LB dressing due to decreased balance and safety awareness.  Required frequent verbal cues for safety and participation in therapy session.  Patient's daughter present during session to provide PLOF and reports concerned for pt safety due to number of falls.  Due to frequent occurrence of falls, recommend SNF - daughter in agreement.  Pt will benefit from OT acutely to decrease burden of care and increase safety with ADLs prior to d/c to next venue of care.    Follow Up Recommendations  SNF    Equipment Recommendations  Other (comment)(defer to next venue)       Precautions / Restrictions Precautions Precautions: Fall Restrictions Weight Bearing Restrictions: No      Mobility Bed Mobility Overal bed mobility: Needs Assistance Bed Mobility: Supine to Sit     Supine to sit: Min guard     General bed mobility comments: increased time and effort  Transfers Overall transfer level: Needs assistance Equipment used: Rolling walker (2 wheeled) Transfers: Sit to/from Stand Sit to Stand: Min assist         General transfer comment: Min A to stand from bedside and toilet; Min  A for steadying throughout    Balance Overall balance assessment: Needs assistance Sitting-balance support: Bilateral upper extremity supported;Feet supported Sitting balance-Leahy Scale: Fair     Standing balance support: Bilateral upper extremity supported;During functional activity Standing balance-Leahy Scale: Poor                             ADL either performed or assessed with clinical judgement   ADL Overall ADL's : Needs assistance/impaired     Grooming: Wash/dry hands;Wash/dry face;Sitting;Set up   Upper Body Bathing: Min guard;Sitting   Lower Body Bathing: Min guard;Sit to/from stand;Minimal assistance       Lower Body Dressing: Moderate assistance;Sit to/from stand Lower Body Dressing Details (indicate cue type and reason): Pt required assistance when donning mesh underwear, but able to don hospital socks with setup and min guard for sitting balance Toilet Transfer: Minimal assistance;Ambulation;RW Toilet Transfer Details (indicate cue type and reason): impulsively attempting to push RW out of way due to urgency with toileting Toileting- Clothing Manipulation and Hygiene: Moderate assistance       Functional mobility during ADLs: Minimal assistance;Rolling walker General ADL Comments: impulsivity with movements, decreased safety awareness and proper use of RW     Vision   Vision Assessment?: Vision impaired- to be further tested in functional context Additional Comments: pt scanning Lt and Rt, however mild Lt gaze preferene noted            Pertinent Vitals/Pain Pain Assessment: No/denies pain  Hand Dominance Right   Extremity/Trunk Assessment Upper Extremity Assessment Upper Extremity Assessment: Generalized weakness   Lower Extremity Assessment Lower Extremity Assessment: Generalized weakness   Cervical / Trunk Assessment Cervical / Trunk Assessment: Kyphotic   Communication Communication Communication: No difficulties    Cognition Arousal/Alertness: Awake/alert Behavior During Therapy: WFL for tasks assessed/performed Overall Cognitive Status: History of cognitive impairments - at baseline                                 General Comments: disoriented to time and situation   General Comments  daughter present and supportive - wishing for patient to d/c to SNF            Home Living Family/patient expects to be discharged to:: Assisted living                             Home Equipment: Walker - 4 wheels;Walker - 2 wheels;Cane - single point   Additional Comments: per daughter patient forgets to use AD      Prior Functioning/Environment Level of Independence: Needs assistance  Gait / Transfers Assistance Needed: intermittent use of AD - high frequency of falls ADL's / Homemaking Assistance Needed: nursing staff provides supervision/setup assistance            OT Problem List: Decreased strength;Decreased activity tolerance;Impaired balance (sitting and/or standing);Decreased safety awareness;Decreased knowledge of use of DME or AE      OT Treatment/Interventions: Self-care/ADL training;DME and/or AE instruction;Therapeutic activities;Cognitive remediation/compensation;Patient/family education;Balance training    OT Goals(Current goals can be found in the care plan section) Acute Rehab OT Goals Patient Stated Goal: daughter wants patient to reduce falls  OT Goal Formulation: With patient/family Time For Goal Achievement: 12/24/18 Potential to Achieve Goals: Good  OT Frequency: Min 2X/week           Co-evaluation PT/OT/SLP Co-Evaluation/Treatment: Yes Reason for Co-Treatment: Complexity of the patient's impairments (multi-system involvement);Necessary to address cognition/behavior during functional activity;For patient/therapist safety;To address functional/ADL transfers PT goals addressed during session: Mobility/safety with mobility;Balance;Proper use of  DME OT goals addressed during session: ADL's and self-care;Proper use of Adaptive equipment and DME      AM-PAC OT "6 Clicks" Daily Activity     Outcome Measure Help from another person eating meals?: None Help from another person taking care of personal grooming?: A Little Help from another person toileting, which includes using toliet, bedpan, or urinal?: A Little Help from another person bathing (including washing, rinsing, drying)?: A Little Help from another person to put on and taking off regular upper body clothing?: A Little Help from another person to put on and taking off regular lower body clothing?: A Lot 6 Click Score: 18   End of Session Equipment Utilized During Treatment: Gait belt;Rolling walker Nurse Communication: Mobility status  Activity Tolerance: Patient tolerated treatment well Patient left: in chair;with chair alarm set;with family/visitor present  OT Visit Diagnosis: Unsteadiness on feet (R26.81);Repeated falls (R29.6);History of falling (Z91.81)                Time: 0947-0962 OT Time Calculation (min): 33 min Charges:  OT General Charges $OT Visit: 1 Visit OT Evaluation $OT Eval Moderate Complexity: [place], [place], [phone] 12/10/2018, 3:53 PM

## 2018-12-10 NOTE — Consult Note (Signed)
Consultation Note Date: 12/10/2018   Patient Name: Shirley Wright  DOB: 1926/05/18  MRN: [id number]  Age / Sex: 85 y.o., female   PCP: Laurey Morale, MD Referring Physician: Mariel Aloe, MD   REASON FOR CONSULTATION:Establishing goals of care  Palliative Care consult requested for this 85 y.o. female with multiple medical problems including hypertension, hyperlipidemia, GERD, depression, back pain, left leg DVT (was on Eliquis, which was stopped on 12/05/2018 due to falls), dementia, osteoarthritis, and pacemaker. Patient presented to ED after falling. She was and evaluated the day prior for a fall. CT of head was negative for acute findings or fractured. She was discharged back to her ALF and fell twice the day of admission sustaining a small right eye laceration. Palliative Medicine team consulted for goals of care discussion.   Clinical Assessment and Goals of Care: I have reviewed medical records including lab results, imaging, Epic notes, and MAR, received report from the bedside RN, and assessed the patient. I met at the bedside with patient and her daughter/POA, Shirley Wright  to discuss diagnosis prognosis, GOC, EOL wishes, disposition and options. Patient is awake, alert & oriented x3. She denies pain or shortness of breath.   I introduced Palliative Medicine as specialized medical care for people living with serious illness. It focuses on providing relief from the symptoms and stress of a serious illness. The goal is to improve quality of life for both the patient and the family.  We discussed a brief life review of the patient, along with her functional and nutritional status.  Patient's family is the owners and founders of [employer] in [place]. She has 4 children. She enjoys spending time with family and friends.   Daughter reports a noticeable decline in her mothers health over the past 6-9 months. She states patient has resided in ALF for some time after the  passing of her husband and diagnosis of dementia. Daughter reports patient has began showing some cognitive changes requiring more frequent monitoring. Patient is forgetting to take medications, wandering around facility requiring staff to place an ankle bracelet on her for safety, and most recently frequent falls. Daughter reports her mother's appetite has declined over the past several months with about a 10lb weight loss over the past 2-3 months. Although family has been unable to directly visit with patient they have visited with her from a distance and video chat. Patient also has shown more signs of confusion however she is still alert to family and most of the time with occasional confusion episodes.   We discussed Her current illness and what it means in the larger context of Her on-going co-morbidities. With specific discussions regarding her recurrent falls, hypotension, and overall nutritional and functional status. Natural disease trajectory and expectations at EOL were discussed. Daughter verbalized understanding of patient's current illness and co-morbidities. Patient states "it's called getting old and preparing to go see the good Reita Cliche   I educated daughter in detail on dementia progression/trajectory. She verbalized understanding.   Daughter is tearful and states her and her brothers have been in conversation regarding their mother's health decline. They recently contacted outpatient hospice to discuss their services further and was also considering transferring patient to the skilled area at [facility] to have more support.   I attempted to elicit values and goals of care important to the patient.    The difference between aggressive medical intervention and comfort care was considered in light of the patient's goals  of care. Daughter reports their wishes are for patient to continue with treatment while hospitalized without aggressive interventions. 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 Daughter states her and her siblings would not want to put patient through extensive medical work-up. Their wishes are for her to be comfortable and not in pain. They do not wish to initiate full comfort but would not want aggressive treatments such as artificial feedings, blood transfusions, extensive work-up (labs and x-rays), ok with simple radiology testing and blood work to continue treatment and get her back to facility would not necessarily want her to have to undergo testing such as MRI or CT scans were she would have to lie flat or still for periods of time. Support given.   Daughter states, if patient does not have an appetite or want to eat, they understand and wish not to pressure her.   Patient and daughter confirms she does have an advance directives. Hilda Blades is patient's HCPOA. I discussed in details patient's full code status with consideration of her current illness and expressed wishes for no escalation of care or aggressive treatments. Patient and daughter both states wishes for DNR/DNI.   Hospice and Palliative Care services outpatient were explained and offered. Patient and family verbalized their understanding and awareness of both palliative and hospice's goals and philosophy of care.   Family is interested in hospice care, however with patient transitioning from ALF to SNF they would like for her to be evaluated by PT for recommendations. If she is recommended for SNF with rehab they would like for her attempt and see if this assist with safety and mobility, with awareness she may continue to show signs of decline and debilitation. Family express goal of transitioning patient to long-term care at the facility and at that time plans to transition her to hospice. If she is eilgible for rehab they wish to initiate  outpatient palliative support. Family is not interested in re-hospitalizations with hopes of treating in place in the future.   Questions and concerns were addressed.  Hard Choices booklet left for review. The family was encouraged to call with questions or concerns.  PMT will continue to support holistically.   SOCIAL HISTORY:     reports that she has never smoked. She has never used smokeless tobacco. She reports that she does not drink alcohol or use drugs.  CODE STATUS: DNR  ADVANCE DIRECTIVES:  Shirley Wright (daughter)  SYMPTOM MANAGEMENT: per attending   Palliative Prophylaxis:   Aspiration, Bowel Regimen, Delirium Protocol, Frequent Pain Assessment, Oral Care and Turn Reposition  PSYCHO-SOCIAL/SPIRITUAL:  Support System: Family   Desire for further Chaplaincy support:NO   Additional Recommendations (Limitations, Scope, Preferences):  Avoid Hospitalization, No Artificial Feeding, No Blood Transfusions and no escalation of care.    PAST MEDICAL HISTORY: Past Medical History:  Diagnosis Date  . Adenomatous colon polyp    tubular  . Allergic rhinitis   . Diverticulosis   . GERD (gastroesophageal reflux disease)   . Hemorrhoids   . Hepatic cyst   . History of colonic polyps   . Hyperlipidemia   . Hypertension   . IBS (irritable bowel syndrome)   . Osteoarthritis     PAST SURGICAL HISTORY:  Past Surgical History:  Procedure Laterality Date  . benign breast biopsy    .  COLONOSCOPY  06 31 11   Dr Earlean Shawl,  benign polyp, diverticulosis, no repeats recommended  . ganglion cyst removed     left thumba  . LUMBAR FUSION     L3-4 and L4-5 per Dr. Carloyn Manner 2002  . TONSILLECTOMY    . VAGINAL HYSTERECTOMY     with ovaries intact    ALLERGIES:  is allergic to penicillins; pneumococcal vaccine polyvalent; and tetanus-diphtheria toxoids td.   MEDICATIONS:  Current Facility-Administered Medications  Medication Dose Route Frequency Provider Last Rate Last Dose  . 0.9 %   sodium chloride infusion   Intravenous Continuous Ivor Costa, MD 75 mL/hr at 12/10/18 1337 75 mL/hr at 12/10/18 1337  . acetaminophen (TYLENOL) tablet 650 mg  650 mg Oral Q6H PRN Ivor Costa, MD       Or  . acetaminophen (TYLENOL) suppository 650 mg  650 mg Rectal Q6H PRN Ivor Costa, MD      . cholecalciferol (VITAMIN D3) tablet 1,000 Units  1,000 Units Oral Daily Ivor Costa, MD   1,000 Units at 12/10/18 [phone]  . DULoxetine (CYMBALTA) DR capsule 30 mg  30 mg Oral Daily Ivor Costa, MD   30 mg at 12/10/18 9604  . ferrous sulfate tablet 325 mg  325 mg Oral Q breakfast Ivor Costa, MD   325 mg at 12/10/18 5409  . heparin injection 5,000 Units  5,000 Units Subcutaneous Cleophas Dunker, MD   5,000 Units at 12/10/18 1338  . hydrALAZINE (APRESOLINE) injection 5 mg  5 mg Intravenous Q2H PRN Ivor Costa, MD   5 mg at 12/10/18 8119  . ondansetron (ZOFRAN) tablet 4 mg  4 mg Oral Q6H PRN Ivor Costa, MD       Or  . ondansetron [facility]) injection 4 mg  4 mg Intravenous Q6H PRN Ivor Costa, MD      . traMADol Veatrice Bourbon) tablet 50 mg  50 mg Oral Q6H PRN Ivor Costa, MD   50 mg at 12/10/18 0017  . traZODone (DESYREL) tablet 25 mg  25 mg Oral Q8H PRN Ivor Costa, MD   25 mg at 12/10/18 0122    VITAL SIGNS: BP (!) 141/57 (BP Location: Left Arm)   Pulse 77   Temp (!) 97.5 F (36.4 C) (Oral)   Resp 18   Ht '5\' 1"'$  (1.549 m)   SpO2 99%   BMI 24.19 kg/m  There were no vitals filed for this visit.  Estimated body mass index is 24.19 kg/m as calculated from the following:   Height as of this encounter: '5\' 1"'$  (1.549 m).   Weight as of 10/21/18: 58.1 kg.  LABS: CBC:    Component Value Date/Time   WBC 7.9 12/10/2018 1646   HGB 8.0 (L) 12/10/2018 1646   HCT 28.2 (L) 12/10/2018 1646   PLT 265 12/10/2018 1646   Comprehensive Metabolic Panel:    Component Value Date/Time   NA 134 (L) 12/10/2018 0238   K 4.0 12/10/2018 0238   CO2 25 12/10/2018 0238   BUN 14 12/10/2018 0238   CREATININE 0.86 12/10/2018 0238    ALBUMIN 3.2 (L) 12/10/2018 1646     Review of Systems  Constitutional: Positive for fatigue.  Neurological: Positive for weakness.  Unless otherwise noted, a complete review of systems is negative.  Physical Exam General: NAD, elderly frail appearing Cardiovascular: regular rate and rhythm, murmur Pulmonary: clear ant fields Abdomen: soft, nontender, + bowel sounds GU: no suprapubic tenderness Extremities: no edema, no joint deformities Skin: no rashes, scattered bruising,  right eye with bruising and laceration (steri strips) Neurological: generalized weakness  Prognosis: Unable to determine (Guarded to Poor) in the setting of dementia progression, recurrent falls, hypotension, anemia, decreased po intake, weight loss (10 lb past 2-3 months), generalized weakness  Discharge Planning:  [place] for rehab with Palliative care service follow-up  Recommendations:  DNR/DNI-as requested by family/patient  Continue to treat without escalation of care  Family requesting PT eval and potential rehab with outpatient palliative with goal of transitioning to long-term care and hospice   CSW referral for palliative  PMT will continue to support and follow    Palliative Performance Scale: PPS 30%                Patient and daughter expressed understanding and was in agreement with this plan.   Thank you for allowing the Palliative Medicine Team to assist in the care of this patient.  Time In: 1215 Time Out: 1330 Time Total: 75 min.   Visit consisted of counseling and education dealing with the complex and emotionally intense issues of symptom management and palliative care in the setting of serious and potentially life-threatening illness.Greater than 50%  of this time was spent counseling and coordinating care related to the above assessment and plan.  Signed by:  Alda Lea, AGPCNP-BC Palliative Medicine Team  Phone: [phone] Fax:  [phone] Pager: [phone] Amion: Bjorn Pippin

## 2018-12-10 NOTE — NC FL2 (Addendum)
[place] MEDICAID FL2 LEVEL OF CARE SCREENING TOOL     IDENTIFICATION  Patient Name: Shirley Wright Birthdate: 12-01-1925 Sex: female Admission Date (Current Location): 12/09/2018  [facility] and [place] Number:  Herbalist and Address:  [facility], [place] [address], [place], [place] [address]      Provider Number: [id number]  Attending Physician Name and Address:  Mariel Aloe, MD  Relative Name and Phone Number:  Hilda Blades, daughter, [phone]    Current Level of Care: Hospital Recommended Level of Care: [place] Prior Approval Number:    Date Approved/Denied:   PASRR Number:   [id number]  Discharge Plan: SNF    Current Diagnoses: Patient Active Problem List   Diagnosis Date Noted  . Frequent falls   . Fall 12/09/2018  . Depression 12/09/2018  . AKI (acute kidney injury) ([place]) 12/09/2018  . Microcytic anemia 12/09/2018  . Hypokalemia 12/09/2018  . Hyponatremia 12/09/2018  . Acute deep vein thrombosis (DVT) of left lower extremity ([place]) 07/15/2018  . DVT (deep venous thrombosis) ([place]) 07/14/2018  . Onychomycosis 02/20/2018  . Memory loss 02/20/2018  . GERD 04/16/2007  . IRRITABLE BOWEL SYNDROME 04/16/2007  . Osteoarthritis 04/16/2007  . BACK PAIN, LUMBAR 04/16/2007  . COLONIC POLYPS, HX OF 04/16/2007  . Dyslipidemia 01/27/2007  . Essential hypertension 01/27/2007  . ALLERGIC RHINITIS 01/27/2007    Orientation RESPIRATION BLADDER Height & Weight     Self, Place  Normal Continent Weight:   Height:  5\' 1"  (154.9 cm)  BEHAVIORAL SYMPTOMS/MOOD NEUROLOGICAL BOWEL NUTRITION STATUS      Continent Diet(Please see DC Summary)  AMBULATORY STATUS COMMUNICATION OF NEEDS Skin   Limited Assist Verbally Normal                       Personal Care Assistance Level of Assistance  Bathing, Feeding, Dressing Bathing Assistance: Limited assistance Feeding assistance: Independent Dressing Assistance: Limited  assistance     Functional Limitations Info  Sight, Hearing, Speech Sight Info: Adequate Hearing Info: Adequate Speech Info: Adequate    SPECIAL CARE FACTORS FREQUENCY  PT (By licensed PT), OT (By licensed OT)     PT Frequency: 5x/week OT Frequency: 3x/week            Contractures Contractures Info: Not present    Additional Factors Info  Code Status, Allergies, Psychotropic Code Status Info: DNR Allergies Info: Penicillins, Pneumococcal Vaccine Polyvalent, Tetanus-diphtheria Toxoids Td Psychotropic Info: Cymbalta         Current Medications (12/10/2018):  This is the current hospital active medication list Current Facility-Administered Medications  Medication Dose Route Frequency Provider Last Rate Last Dose  . 0.9 %  sodium chloride infusion   Intravenous Continuous Ivor Costa, MD 75 mL/hr at 12/10/18 1337 75 mL/hr at 12/10/18 1337  . acetaminophen (TYLENOL) tablet 650 mg  650 mg Oral Q6H PRN Ivor Costa, MD       Or  . acetaminophen (TYLENOL) suppository 650 mg  650 mg Rectal Q6H PRN Ivor Costa, MD      . cholecalciferol (VITAMIN D3) tablet 1,000 Units  1,000 Units Oral Daily Ivor Costa, MD   1,000 Units at 12/10/18 [phone]  . DULoxetine (CYMBALTA) DR capsule 30 mg  30 mg Oral Daily Ivor Costa, MD   30 mg at 12/10/18 0973  . ferrous sulfate tablet 325 mg  325 mg Oral Q breakfast Ivor Costa, MD   325 mg at 12/10/18 5329  . heparin  injection 5,000 Units  5,000 Units Subcutaneous Q8H Ivor Costa, MD   5,000 Units at 12/10/18 1338  . hydrALAZINE (APRESOLINE) injection 5 mg  5 mg Intravenous Q2H PRN Ivor Costa, MD   5 mg at 12/10/18 2595  . ondansetron (ZOFRAN) tablet 4 mg  4 mg Oral Q6H PRN Ivor Costa, MD       Or  . ondansetron [facility]) injection 4 mg  4 mg Intravenous Q6H PRN Ivor Costa, MD      . traMADol Veatrice Bourbon) tablet 50 mg  50 mg Oral Q6H PRN Ivor Costa, MD   50 mg at 12/10/18 0017  . traZODone (DESYREL) tablet 25 mg  25 mg Oral Q8H PRN Ivor Costa, MD   25 mg at 12/10/18  0122     Discharge Medications: Please see discharge summary for a list of discharge medications.  Relevant Imaging Results:  Relevant Lab Results:   Additional Information ssn: [id number]     COVID negative on 7/21 Palliative to continue to follow at SNF.  Benard Halsted, LCSW

## 2018-12-10 NOTE — TOC Initial Note (Signed)
Transition of Care [facility]) - Initial/Assessment Note    Patient Details  Name: Shirley Wright MRN: [id number] Date of Birth: 11-03-1925  [facility] CM/SW Contact:    Benard Halsted, LCSW Phone Number: 12/10/2018, 4:59 PM  Clinical Narrative:                 CSW received consult for possible SNF placement at time of discharge. CSW spoke with patient's daughter regarding PT recommendation of SNF placement at time of discharge. Patient's daughter reported that she is currently unable to care for patient at their home given patient's current physical needs and fall risk. Palliative follows patient at home. Patient's daughter expressed understanding of PT recommendation and is agreeable to SNF placement at time of discharge. Patient reports preference for [facility] since patient resides at their ALF. [facility] is able to accept patient. CSW discussed insurance authorization process and provided Medicare SNF ratings list. Patient's daughter expressed being hopeful for rehab and for patient to feel better soon. No further questions reported at this time. CSW to continue to follow and assist with discharge planning needs.   Expected Discharge Plan: Skilled Nursing Facility Barriers to Discharge: Insurance Authorization, Continued Medical Work up   Patient Goals and CMS Choice Patient states their goals for this hospitalization and ongoing recovery are:: Rehab CMS Medicare.gov Compare Post Acute Care list provided to:: Patient Represenative (must comment)(Daughter) Choice offered to / list presented to : Adult Children  Expected Discharge Plan and Services Expected Discharge Plan: [place] In-house Referral: Clinical Social Work   Post Acute Care Choice: [place] Living arrangements for the past 2 months: Single Family Home                 DME Arranged: N/A         HH Arranged: NA          Prior Living  Arrangements/Services Living arrangements for the past 2 months: Single Family Home Lives with:: Adult Children Patient language and need for interpreter reviewed:: Yes Do you feel safe going back to the place where you live?: Yes      Need for Family Participation in Patient Care: Yes (Comment) Care giver support system in place?: Yes (comment)   Criminal Activity/Legal Involvement Pertinent to Current Situation/Hospitalization: No - Comment as needed  Activities of Daily Living   ADL Screening (condition at time of admission) Patient's cognitive ability adequate to safely complete daily activities?: No Is the patient deaf or have difficulty hearing?: No Does the patient have difficulty seeing, even when wearing glasses/contacts?: No Does the patient have difficulty concentrating, remembering, or making decisions?: Yes(Hx dementia) Patient able to express need for assistance with ADLs?: Yes Does the patient have difficulty dressing or bathing?: No Independently performs ADLs?: No Dressing (OT): Needs assistance Grooming: Needs assistance Feeding: Appropriate for developmental age Bathing: Needs assistance Is this a change from baseline?: Change from baseline, expected to last >3 days Toileting: Needs assistance Is this a change from baseline?: Change from baseline, expected to last >3days In/Out Bed: Needs assistance Is this a change from baseline?: Change from baseline, expected to last >3 days Walks in Home: Needs assistance Is this a change from baseline?: Change from baseline, expected to last >3 days Does the patient have difficulty walking or climbing stairs?: Yes Weakness of Legs: Both Weakness of Arms/Hands: Both  Permission Sought/Granted Permission sought to share information with : Facility Sport and exercise psychologist, Family Supports Permission granted to  share information with : No  Share Information with NAME: Hilda Blades  Permission granted to share info w AGENCY:  SNFs  Permission granted to share info w Relationship: Daughter  Permission granted to share info w Contact Information: [phone]  Emotional Assessment Appearance:: Appears stated age Attitude/Demeanor/Rapport: Unable to Assess Affect (typically observed): Unable to Assess Orientation: : Oriented to Self, Oriented to Place Alcohol / Substance Use: Not Applicable Psych Involvement: No (comment)  Admission diagnosis:  Hypokalemia [E87.6] Hyponatremia [E87.1] AKI (acute kidney injury) ([place]) [N17.9] Frequent falls [R29.6] Contusion of face, initial encounter [S00.83XA] Symptomatic anemia [D64.9] Patient Active Problem List   Diagnosis Date Noted  . Frequent falls   . Fall 12/09/2018  . Depression 12/09/2018  . AKI (acute kidney injury) ([place]) 12/09/2018  . Microcytic anemia 12/09/2018  . Hypokalemia 12/09/2018  . Hyponatremia 12/09/2018  . Acute deep vein thrombosis (DVT) of left lower extremity ([place]) 07/15/2018  . DVT (deep venous thrombosis) ([place]) 07/14/2018  . Onychomycosis 02/20/2018  . Memory loss 02/20/2018  . GERD 04/16/2007  . IRRITABLE BOWEL SYNDROME 04/16/2007  . Osteoarthritis 04/16/2007  . BACK PAIN, LUMBAR 04/16/2007  . COLONIC POLYPS, HX OF 04/16/2007  . Dyslipidemia 01/27/2007  . Essential hypertension 01/27/2007  . ALLERGIC RHINITIS 01/27/2007   PCP:  Laurey Morale, MD Pharmacy:  No Pharmacies Listed    Social Determinants of Health (SDOH) Interventions    Readmission Risk Interventions No flowsheet data found.

## 2018-12-10 NOTE — Evaluation (Signed)
Physical Therapy Evaluation Patient Details Name: Shirley Wright MRN: [id number] DOB: [birth year] Today's Date: 12/10/2018   History of Present Illness  Patient is a 85 y/o female presenting to the ED s/p fall. Of note, fall on 7/20 with CT of head and CT of C-spine were negative for acute intracranial abnormalities or bony fracture. Past medical history significant of hypertension, hyperlipidemia, GERD, depression, back pain, left leg DVT (was on Eliquis, which was stopped on 12/05/2018), dementia, pacemaker.     Clinical Impression  Patient presenting with the above. Per chart, multiple falls this week. Patient requiring Min A +2 for safety during session. Patient with some confusion during session - likely at baseline, with family present to supplement PLOF. Patient with poor safety awareness with mobility with consistent verbal cueing provided. Due to current functional status with high history/further risk of falls with recommend d/c to SNF - daughter in agreement of this. PT to follow acutely.      Follow Up Recommendations SNF    Equipment Recommendations  None recommended by PT    Recommendations for Other Services       Precautions / Restrictions Precautions Precautions: Fall Restrictions Weight Bearing Restrictions: No      Mobility  Bed Mobility Overal bed mobility: Needs Assistance Bed Mobility: Supine to Sit     Supine to sit: Min guard     General bed mobility comments: increased time and effort  Transfers Overall transfer level: Needs assistance Equipment used: Rolling walker (2 wheeled) Transfers: Sit to/from Stand Sit to Stand: Min assist         General transfer comment: Min A to stand from bedside and toilet; Min A for steadying throughout  Ambulation/Gait Ambulation/Gait assistance: Min assist;+2 safety/equipment Gait Distance (Feet): 30 Feet Assistive device: Rolling walker (2 wheeled) Gait Pattern/deviations: Step-to pattern;Step-through  pattern;Decreased stride length;Trunk flexed Gait velocity: decreased   General Gait Details: ambulating to restroom; decreased safety awareness with unsteady gait pattern  Stairs            Wheelchair Mobility    Modified Rankin (Stroke Patients Only)       Balance Overall balance assessment: Needs assistance Sitting-balance support: Bilateral upper extremity supported;Feet supported Sitting balance-Leahy Scale: Fair     Standing balance support: Bilateral upper extremity supported;During functional activity Standing balance-Leahy Scale: Poor                               Pertinent Vitals/Pain Pain Assessment: No/denies pain    Home Living Family/patient expects to be discharged to:: Assisted living               Home Equipment: Walker - 4 wheels;Walker - 2 wheels;Cane - single point Additional Comments: per daughter patient forgets to use AD    Prior Function Level of Independence: Needs assistance   Gait / Transfers Assistance Needed: intermittent use of Ad - high frequency of falls  ADL's / Homemaking Assistance Needed: nursing staff provides supervision assistance        Hand Dominance   Dominant Hand: Right    Extremity/Trunk Assessment   Upper Extremity Assessment Upper Extremity Assessment: Defer to OT evaluation    Lower Extremity Assessment Lower Extremity Assessment: Generalized weakness    Cervical / Trunk Assessment Cervical / Trunk Assessment: Kyphotic  Communication   Communication: No difficulties  Cognition Arousal/Alertness: Awake/alert Behavior During Therapy: WFL for tasks assessed/performed Overall Cognitive Status: History of cognitive impairments -  at baseline                                        General Comments General comments (skin integrity, edema, etc.): daughter present and supportive - wishing for patient to d/c to SNF    Exercises     Assessment/Plan    PT Assessment  Patient needs continued PT services  PT Problem List Decreased strength;Decreased activity tolerance;Decreased balance;Decreased mobility;Decreased coordination;Decreased knowledge of use of DME;Decreased safety awareness       PT Treatment Interventions DME instruction;Gait training;Functional mobility training;Therapeutic activities;Therapeutic exercise;Balance training;Patient/family education    PT Goals (Current goals can be found in the Care Plan section)  Acute Rehab PT Goals Patient Stated Goal: daughter wants patient to reduce falls  PT Goal Formulation: With patient/family Time For Goal Achievement: 12/24/18 Potential to Achieve Goals: Good    Frequency Min 2X/week   Barriers to discharge        Co-evaluation PT/OT/SLP Co-Evaluation/Treatment: Yes Reason for Co-Treatment: Complexity of the patient's impairments (multi-system involvement);Necessary to address cognition/behavior during functional activity;For patient/therapist safety;To address functional/ADL transfers PT goals addressed during session: Mobility/safety with mobility;Balance;Proper use of DME         AM-PAC PT "6 Clicks" Mobility  Outcome Measure Help needed turning from your back to your side while in a flat bed without using bedrails?: A Little Help needed moving from lying on your back to sitting on the side of a flat bed without using bedrails?: A Little Help needed moving to and from a bed to a chair (including a wheelchair)?: A Little Help needed standing up from a chair using your arms (e.g., wheelchair or bedside chair)?: A Little Help needed to walk in hospital room?: A Little Help needed climbing 3-5 steps with a railing? : A Lot 6 Click Score: 17    End of Session Equipment Utilized During Treatment: Gait belt Activity Tolerance: Patient tolerated treatment well Patient left: in chair;with call bell/phone within reach;with chair alarm set;with family/visitor present Nurse Communication:  Mobility status PT Visit Diagnosis: Unsteadiness on feet (R26.81);Other abnormalities of gait and mobility (R26.89);Muscle weakness (generalized) (M62.81);Repeated falls (R29.6)    Time: 5035-4656 PT Time Calculation (min) (ACUTE ONLY): 34 min   Charges:   PT Evaluation $PT Eval Moderate Complexity: 1 Mod           Lanney Gins, PT, DPT Supplemental Physical Therapist 12/10/18 3:35 PM Pager: [phone] Office: [phone]

## 2018-12-10 NOTE — Progress Notes (Signed)
[facility] PROGRESS NOTE  KEALY LEWTER DQQ:[id number] DOB: 11-Sep-1925 DOA: 12/09/2018 PCP: Laurey Morale, MD  Assessment/Plan:   Lytle Michaels. Likely multifactorial etiology, including dementia, electrolyte disturbance, AKI, worsening anemia in setting of orthostatic hypotension. No focal neurologic findings. CT of head and CT of C-spine were negative for acute intracranial abnormalities or bony fracture. No events on tele. Urinalysis with small leukocytes, rare bacteria, no nitrite no WBC.  Has been getting IV fluids.  -pt/ot - recheck orthostatic vital signs.  Essential hypertension: orthostatic on admission. Home meds include metoprolol.  -hold metoprolol for now. Plan to resume as indicted with parameters -IV hydralazine as needed -montitor closely  Hx of DVT (deep venous thrombosis) ([place]): completed course of Eliquis -no acute issues  Depression: -Cymbalta  AKI: Likely due to prerenal secondary to dehydration and continuation of ACEI, ARB, diuretics, NSAIDs. Creatine 1.23 on admission. Down to 0.86 this am.  - IVF as above  - monitor urine output -hold nephrotoxins  Microcytic anemia: Hgb 9.6 on 07/15/18 -->8.5. No rectal bleeding or dark stool.  FOBT negative. Anemia panel with iron 17, st ratio 4. -continue home supplement -consider IV iron  Hypokalemia: K=3.4  on admission. repleted and 4.0 this am. Mag level 2.1. -monitor  Hyponatremia: mild. Na 130 on admission and 134 this am. -IV NS as above   Code Status: full Family Communication: daughter over phone. She also requests palliative care consult Disposition Plan: back to facility hopefully tomorrow   Consultants:  Palliative care  Procedures:    Antibiotics:    HPI/Subjective: Awake alert oriented to self only. Denies pain/discomfort. Requesting bathroom  Objective: Vitals:   12/09/18 2135 12/10/18 0449  BP: (!) 169/60 (!) 179/67  Pulse: 67 66  Resp: 18 18  Temp: 97.7 F (36.5 C)  (!) 97.5 F (36.4 C)  SpO2: 98% 99%    Intake/Output Summary (Last 24 hours) at 12/10/2018 0905 Last data filed at 12/10/2018 0650 Gross per 24 hour  Intake 1028.75 ml  Output 350 ml  Net 678.75 ml   There were no vitals filed for this visit.  Exam:   General:  Somewhat pale and frail appearing  Cardiovascular: rrr +murmur no LE edema  Respiratory: normal effort BS clear bilaterally  Abdomen: non-distended non-tender +BS   Musculoskeletal: joints without swelling/erythema  HEENT laceration above right eye with mild bruising to right eye.   Data Reviewed: Basic Metabolic Panel: Recent Labs  Lab 12/09/18 1648 12/10/18 0238  NA 130* 134*  K 3.4* 4.0  CL 95* 102  CO2 23 25  GLUCOSE 145* 123*  BUN 21 14  CREATININE 1.23* 0.86  CALCIUM 9.4 9.0  MG  --  1.8   Liver Function Tests: No results for input(s): AST, ALT, ALKPHOS, BILITOT, PROT, ALBUMIN in the last 168 hours. No results for input(s): LIPASE, AMYLASE in the last 168 hours. No results for input(s): AMMONIA in the last 168 hours. CBC: Recent Labs  Lab 12/09/18 1648 12/10/18 0238  WBC 11.3* 7.3  HGB 8.5* 7.7*  HCT 31.4* 27.3*  MCV 77.0* 76.3*  PLT 311 263   Cardiac Enzymes: No results for input(s): CKTOTAL, CKMB, CKMBINDEX, TROPONINI in the last 168 hours. BNP (last 3 results) No results for input(s): BNP in the last 8760 hours.  ProBNP (last 3 results) No results for input(s): PROBNP in the last 8760 hours.  CBG: No results for input(s): GLUCAP in the last 168 hours.  Recent Results (from the past 240 hour(s))  SARS Coronavirus  2 (CEPHEID - Performed in [place] lab), Hosp Order     Status: None   Collection Time: 12/09/18  7:16 PM   Specimen: Nasopharyngeal Swab  Result Value Ref Range Status   SARS Coronavirus 2 NEGATIVE NEGATIVE Final    Comment: (NOTE) If result is NEGATIVE SARS-CoV-2 target nucleic acids are NOT DETECTED. The SARS-CoV-2 RNA is generally detectable in upper  and lower  respiratory specimens during the acute phase of infection. The lowest  concentration of SARS-CoV-2 viral copies this assay can detect is 250  copies / mL. A negative result does not preclude SARS-CoV-2 infection  and should not be used as the sole basis for treatment or other  patient management decisions.  A negative result may occur with  improper specimen collection / handling, submission of specimen other  than nasopharyngeal swab, presence of viral mutation(s) within the  areas targeted by this assay, and inadequate number of viral copies  (<250 copies / mL). A negative result must be combined with clinical  observations, patient history, and epidemiological information. If result is POSITIVE SARS-CoV-2 target nucleic acids are DETECTED. The SARS-CoV-2 RNA is generally detectable in upper and lower  respiratory specimens dur ing the acute phase of infection.  Positive  results are indicative of active infection with SARS-CoV-2.  Clinical  correlation with patient history and other diagnostic information is  necessary to determine patient infection status.  Positive results do  not rule out bacterial infection or co-infection with other viruses. If result is PRESUMPTIVE POSTIVE SARS-CoV-2 nucleic acids MAY BE PRESENT.   A presumptive positive result was obtained on the submitted specimen  and confirmed on repeat testing.  While 2019 novel coronavirus  (SARS-CoV-2) nucleic acids may be present in the submitted sample  additional confirmatory testing may be necessary for epidemiological  and / or clinical management purposes  to differentiate between  SARS-CoV-2 and other Sarbecovirus currently known to infect humans.  If clinically indicated additional testing with an alternate test  methodology [id number]) is advised. The SARS-CoV-2 RNA is generally  detectable in upper and lower respiratory sp ecimens during the acute  phase of infection. The expected result is  Negative. Fact Sheet for Patients:  StrictlyIdeas.no Fact Sheet for Healthcare Providers: BankingDealers.co.za This test is not yet approved or cleared by the [place] FDA and has been authorized for detection and/or diagnosis of SARS-CoV-2 by FDA under an Emergency Use Authorization (EUA).  This EUA will remain in effect (meaning this test can be used) for the duration of the COVID-19 declaration under Section 564(b)(1) of the Act, 21 U.S.C. section 360bbb-3(b)(1), unless the authorization is terminated or revoked sooner. Performed at [facility], [place] [address] [place], [place] [address]      Studies: Ct Head Wo Contrast  Result Date: 12/08/2018 CLINICAL DATA:  Right head lacerations and soft tissue swelling following a fall today. EXAM: CT HEAD WITHOUT CONTRAST CT CERVICAL SPINE WITHOUT CONTRAST TECHNIQUE: Multidetector CT imaging of the head and cervical spine was performed following the standard protocol without intravenous contrast. Multiplanar CT image reconstructions of the cervical spine were also generated. COMPARISON:  None. FINDINGS: CT HEAD FINDINGS Brain: Mild-to-moderate enlargement of the ventricles and subarachnoid spaces. Mild-to-moderate patchy white matter low density in both cerebral hemispheres. No intracranial hemorrhage, mass lesion or CT evidence of acute infarction. Vascular: No hyperdense vessel or unexpected calcification. Skull: Bilateral hyperostosis frontalis. 2.2 x 1.1 cm oval, homogeneously densely calcified mass arising from the anterior aspect of  the right frontal bone with a broad base against the skull. No skull fractures are seen. Sinuses/Orbits: Status post bilateral cataract extraction. Unremarkable bones and included paranasal sinuses. Other: Right posterolateral scalp hematoma. CT CERVICAL SPINE FINDINGS Alignment: Mild reversal the normal lordosis in the mid to lower cervical spine. Mild  dextroconvex cervicothoracic scoliosis. Mild retrolisthesis at the C4-5 and C5-6 levels. Skull base and vertebrae: No acute fracture. No primary bone lesion or focal pathologic process. Soft tissues and spinal canal: No prevertebral fluid or swelling. No visible canal hematoma. Disc levels: Multilevel degenerative changes, most pronounced at the C4-5, C5-6 and C6-7 levels. Upper chest: Clear lung apices. Other: None. IMPRESSION: 1. Right posterolateral scalp hematoma without skull fracture or intracranial hemorrhage. 2. No cervical spine fracture or traumatic subluxation. 3. Mild to moderate diffuse cerebral and cerebellar atrophy. 4. Mild to moderate chronic small vessel white matter ischemic changes in both cerebral hemispheres. 5. 2.2 x 1.1 cm right frontal old, calcified cephalohematoma or exostosis. 6. Multilevel cervical spine degenerative changes. Electronically Signed   By: Claudie Revering M.D.   On: 12/08/2018 09:41   Ct Cervical Spine Wo Contrast  Result Date: 12/08/2018 CLINICAL DATA:  Right head lacerations and soft tissue swelling following a fall today. EXAM: CT HEAD WITHOUT CONTRAST CT CERVICAL SPINE WITHOUT CONTRAST TECHNIQUE: Multidetector CT imaging of the head and cervical spine was performed following the standard protocol without intravenous contrast. Multiplanar CT image reconstructions of the cervical spine were also generated. COMPARISON:  None. FINDINGS: CT HEAD FINDINGS Brain: Mild-to-moderate enlargement of the ventricles and subarachnoid spaces. Mild-to-moderate patchy white matter low density in both cerebral hemispheres. No intracranial hemorrhage, mass lesion or CT evidence of acute infarction. Vascular: No hyperdense vessel or unexpected calcification. Skull: Bilateral hyperostosis frontalis. 2.2 x 1.1 cm oval, homogeneously densely calcified mass arising from the anterior aspect of the right frontal bone with a broad base against the skull. No skull fractures are seen.  Sinuses/Orbits: Status post bilateral cataract extraction. Unremarkable bones and included paranasal sinuses. Other: Right posterolateral scalp hematoma. CT CERVICAL SPINE FINDINGS Alignment: Mild reversal the normal lordosis in the mid to lower cervical spine. Mild dextroconvex cervicothoracic scoliosis. Mild retrolisthesis at the C4-5 and C5-6 levels. Skull base and vertebrae: No acute fracture. No primary bone lesion or focal pathologic process. Soft tissues and spinal canal: No prevertebral fluid or swelling. No visible canal hematoma. Disc levels: Multilevel degenerative changes, most pronounced at the C4-5, C5-6 and C6-7 levels. Upper chest: Clear lung apices. Other: None. IMPRESSION: 1. Right posterolateral scalp hematoma without skull fracture or intracranial hemorrhage. 2. No cervical spine fracture or traumatic subluxation. 3. Mild to moderate diffuse cerebral and cerebellar atrophy. 4. Mild to moderate chronic small vessel white matter ischemic changes in both cerebral hemispheres. 5. 2.2 x 1.1 cm right frontal old, calcified cephalohematoma or exostosis. 6. Multilevel cervical spine degenerative changes. Electronically Signed   By: Claudie Revering M.D.   On: 12/08/2018 09:41    Scheduled Meds: . cholecalciferol  1,000 Units Oral Daily  . DULoxetine  30 mg Oral Daily  . ferrous sulfate  325 mg Oral Q breakfast  . heparin  5,000 Units Subcutaneous Q8H  . metoprolol succinate  50 mg Oral Daily   Continuous Infusions: . sodium chloride 75 mL/hr at 12/10/18 1610    Principal Problem:   Fall Active Problems:   AKI (acute kidney injury) ([place])   Hypokalemia   Hyponatremia   Frequent falls   Essential hypertension   Microcytic anemia  DVT (deep venous thrombosis) ([place])   Depression    Time spent: 67 minutes    [place] NP [facility]  If 7PM-7AM, please contact night-coverage at [email or web address], password [facility] 12/10/2018, 9:05 AM  LOS: 0 days

## 2018-12-11 ENCOUNTER — Inpatient Hospital Stay (HOSPITAL_COMMUNITY): Payer: Medicare Other

## 2018-12-11 LAB — HAPTOGLOBIN: Haptoglobin: 200 mg/dL (ref 41–333)

## 2018-12-11 MED ORDER — TRAZODONE HCL 50 MG PO TABS: 25.0000 mg | ORAL_TABLET | Freq: Every evening | ORAL | Status: DC | PRN

## 2018-12-11 MED ORDER — METOPROLOL SUCCINATE ER 25 MG PO TB24
12.5000 mg | ORAL_TABLET | Freq: Every day | ORAL | Status: DC
  Administered 2018-12-11: 12.5 mg via ORAL
  Filled 2018-12-11: qty 1

## 2018-12-11 NOTE — Progress Notes (Signed)
Daily Progress Note   Patient Name: Shirley Wright       Date: 12/11/2018 DOB: [birth year]  Age: 85 y.o. MRN#: [id number] Attending Physician: Mariel Aloe, MD Primary Care Physician: Laurey Morale, MD Admit Date: 12/09/2018  Reason for Consultation/Follow-up: Establishing goals of care  Subjective: Patient sleeping. Easily aroused with voice commands. Complains of hip pain at times, but states it is not currently hurting. Denies shortness of breath. Daughter is at the bedside. States her mother has been sleeping most of the day. States she will wake up and talk some but then falls back to sleep. Patient does awake while visiting with daughter. Asking what time it is. She then laughs and is fussing at her daughter asking her "why she let her sleep this long". Daughter explained she wanted her to rest. Patient states she is ready to get up. Radiology now at the bedside to transport patient for hip x-ray. Patient awake and requesting to put on her mask and waving good-bye.   Daughter and I reviewed goals of care discussion from yesterday. Daughter expresses awareness of patient being approved for SNF placement and family is in agreement. Aware outpatient palliative will follow and support until family makes decision to transition to hospice. Daughter aware she may discuss further with outpatient palliative for transition when appropriate. Daughter is appreciative of care and support while her mother is hospitalized.   Length of Stay: 1  Current Medications: Scheduled Meds:  . cholecalciferol  1,000 Units Oral Daily  . DULoxetine  30 mg Oral Daily  . ferrous sulfate  325 mg Oral Q breakfast  . heparin  5,000 Units Subcutaneous Q8H    Continuous Infusions:   PRN Meds: acetaminophen **OR**  acetaminophen, hydrALAZINE, ondansetron **OR** ondansetron (ZOFRAN) IV, traMADol  Physical Exam        General: NAD, elderly frail-appearing Respiratory: diminished bases, clear Cardio:  RRR, S1S2 Neuro: awake, alert & oriented x3   Vital Signs: BP (!) 106/53   Pulse (!) 123   Temp (!) 97.4 F (36.3 C)   Resp 20   Ht 5\' 1"  (1.549 m)   SpO2 100%   BMI 24.19 kg/m  SpO2: SpO2: 100 % O2 Device: O2 Device: Room Air O2 Flow Rate:    Intake/output summary:   Intake/Output Summary (Last 24 hours)  at 12/11/2018 7026 Last data filed at 12/11/2018 1700 Gross per 24 hour  Intake 2414.53 ml  Output 400 ml  Net 2014.53 ml   LBM:   Baseline Weight:   Most recent weight:         Palliative Assessment/Data: PPS 30%      Patient Active Problem List   Diagnosis Date Noted  . Frequent falls   . Fall 12/09/2018  . Depression 12/09/2018  . AKI (acute kidney injury) ([place]) 12/09/2018  . Microcytic anemia 12/09/2018  . Hypokalemia 12/09/2018  . Hyponatremia 12/09/2018  . Acute deep vein thrombosis (DVT) of left lower extremity ([place]) 07/15/2018  . DVT (deep venous thrombosis) ([place]) 07/14/2018  . Onychomycosis 02/20/2018  . Memory loss 02/20/2018  . GERD 04/16/2007  . IRRITABLE BOWEL SYNDROME 04/16/2007  . Osteoarthritis 04/16/2007  . BACK PAIN, LUMBAR 04/16/2007  . COLONIC POLYPS, HX OF 04/16/2007  . Dyslipidemia 01/27/2007  . Essential hypertension 01/27/2007  . ALLERGIC RHINITIS 01/27/2007    Palliative Care Assessment & Plan   Patient Profile: Palliative Care consult requested for this 85 y.o. female with multiple medical problems includinghypertension, hyperlipidemia, GERD, depression, back pain, left leg DVT (was on Eliquis, which wasstoppedon 12/05/2018 due to falls), dementia, osteoarthritis, and pacemaker. Patient presented to ED after falling. She was and evaluated the day prior for a fall. CT of head was negative for acute findings or fractured. She was discharged  back to her ALF and fell twice the day of admission sustaining a small right eye laceration. Palliative Medicine team consulted for goals of care discussion.   Recommendations/Plan:  DNR/DNI  Continue current treatment plan per attending  SNF with rehab and outpatient palliative  Goals of Care and Additional Recommendations:  Limitations on Scope of Treatment: Avoid Hospitalization, No Artificial Feeding, No Blood Transfusions and no escalation of care.   Code Status:    Code Status Orders  (From admission, onward)         Start     Ordered   12/10/18 1324  Do not attempt resuscitation (DNR)  Continuous    Question Answer Comment  In the event of cardiac or respiratory ARREST Do not call a "code blue"   In the event of cardiac or respiratory ARREST Do not perform Intubation, CPR, defibrillation or ACLS   In the event of cardiac or respiratory ARREST Use medication by any route, position, wound care, and other measures to relive pain and suffering. May use oxygen, suction and manual treatment of airway obstruction as needed for comfort.   Comments as confirmed by daugther/POA      12/10/18 1323        Code Status History    Date Active Date Inactive Code Status Order ID Comments User Context   12/09/2018 2314 12/10/2018 1323 Full Code [id number]  Ivor Costa, MD Inpatient   07/15/2018 0145 07/17/2018 1734 Full Code [id number]  Rise Patience, MD Inpatient   Advance Care Planning Activity    Advance Directive Documentation     Most Recent Value  Type of Advance Directive  Healthcare Power of Attorney, Living will, Out of facility DNR (pink MOST or yellow form)  Pre-existing out of facility DNR order (yellow form or pink MOST form)  -  "MOST" Form in Place?  -       Prognosis:   Unable to determine (Guarded to Poor)  Discharge Planning:  [place] for rehab with Palliative care service follow-up  Thank you for allowing the Palliative  Medicine Team  to assist in the care of this patient.  Total Time: 35 min.   Greater than 50%  of this time was spent counseling and coordinating care related to the above assessment and plan.  Alda Lea, AGPCNP-BC Palliative Medicine Team  Phone: [phone] Amion: Bjorn Pippin    Please contact Palliative Medicine Team phone at [phone] for questions and concerns.

## 2018-12-11 NOTE — Progress Notes (Signed)
PROGRESS NOTE    Shirley Wright  TKP:[id number] DOB: 1925-05-28 DOA: 12/09/2018 PCP: Laurey Morale, MD   Brief Narrative: Shirley Wright is a 85 y.o. female with a Past Medical History of hypertension, hyperlipidemia, GERD, depression, back pain, left leg DVT (was on Eliquis, which wasstoppedon 12/05/2018), dementia, pacemaker placement who presents after a fall. She is orthostatic and is being   Assessment & Plan:   Principal Problem:   Fall Active Problems:   Essential hypertension   DVT (deep venous thrombosis) (HCC)   Depression   AKI (acute kidney injury) ([place])   Microcytic anemia   Hypokalemia   Hyponatremia   Frequent falls   Orthostatic hypotension Appears to be the cause of her fall. Continues to be hypotensive today with orthostatic vitals. -Recommend discontinuing metoprolol on discharge -Repeat orthostatic vitals in AM  Fall Some right hip pain. Laceration over right eye with some bruising -Hip x-ray today  Essential hypertension Elevated supine blood pressure. Significant orthostatic vitals as mentioned above. -Hold antihypertensives  Somnolence Possibly medication mediated. -Discontinue Trazodone -Watch overnight; will avoid significant workup secondary to goals of care  History of DVT Finished Eliquis course  Depression -Continue Cymbalta  AKI Secondary to dehydration. Resolved.  Microcytic anemia Hemoglobin now stable. Unknown etiology. No blood transfusions per goals of care -Continue iron supplementation  Hypokalemia Resolved with supplementation  Hyponatremia Mild and now improved.   DVT prophylaxis: Heparin subq Code Status:   Code Status: DNR Family Communication: Daughter at bedside Disposition Plan: Discharge to SNF likely in 24 hours   Consultants:   Palliative care  Procedures:   None  Antimicrobials:  None    Subjective: Sleepy today. Some right hip pain  Objective: Vitals:   12/11/18 1046  12/11/18 1501 12/11/18 1502 12/11/18 1507  BP:  (!) 141/73 (!) 158/68 (!) 106/53  Pulse:  (!) 104 (!) 102 (!) 123  Resp:  17 18 20   Temp:      TempSrc:      SpO2: 100% 100% 96% 100%  Height:        Intake/Output Summary (Last 24 hours) at 12/11/2018 1527 Last data filed at 12/11/2018 1340 Gross per 24 hour  Intake 1357.13 ml  Output 400 ml  Net 957.13 ml   There were no vitals filed for this visit.  Examination:  General exam: Appears calm and comfortable Respiratory system: Clear to auscultation. Respiratory effort normal. Cardiovascular system: S1 & S2 heard, RRR. No murmurs, rubs, gallops or clicks. Gastrointestinal system: Abdomen is nondistended, soft and nontender. No organomegaly or masses felt. Normal bowel sounds heard. Central nervous system: Alert and oriented. No focal neurological deficits. Extremities: No edema. No calf tenderness. No significant hip tenderness. No visible ecchymosis Skin: No cyanosis. No rashes Psychiatry: Judgement and insight appear normal. Mood & affect appropriate.     Data Reviewed: I have personally reviewed following labs and imaging studies  CBC: Recent Labs  Lab 12/09/18 1648 12/10/18 0238 12/10/18 1646  WBC 11.3* 7.3 7.9  HGB 8.5* 7.7* 8.0*  HCT 31.4* 27.3* 28.2*  MCV 77.0* 76.3* 75.8*  PLT 311 263 517   Basic Metabolic Panel: Recent Labs  Lab 12/09/18 1648 12/10/18 0238  NA 130* 134*  K 3.4* 4.0  CL 95* 102  CO2 23 25  GLUCOSE 145* 123*  BUN 21 14  CREATININE 1.23* 0.86  CALCIUM 9.4 9.0  MG  --  1.8   GFR: CrCl cannot be calculated (Unknown ideal weight.). Liver Function Tests: Recent  Labs  Lab 12/10/18 1646  AST 19  ALT 13  ALKPHOS 65  BILITOT 0.6  PROT 6.5  ALBUMIN 3.2*   No results for input(s): LIPASE, AMYLASE in the last 168 hours. No results for input(s): AMMONIA in the last 168 hours. Coagulation Profile: No results for input(s): INR, PROTIME in the last 168 hours. Cardiac Enzymes: No  results for input(s): CKTOTAL, CKMB, CKMBINDEX, TROPONINI in the last 168 hours. BNP (last 3 results) No results for input(s): PROBNP in the last 8760 hours. HbA1C: No results for input(s): HGBA1C in the last 72 hours. CBG: No results for input(s): GLUCAP in the last 168 hours. Lipid Profile: No results for input(s): CHOL, HDL, LDLCALC, TRIG, CHOLHDL, LDLDIRECT in the last 72 hours. Thyroid Function Tests: No results for input(s): TSH, T4TOTAL, FREET4, T3FREE, THYROIDAB in the last 72 hours. Anemia Panel: Recent Labs    12/09/18 1845  VITAMINB12 437  FOLATE 13.5  FERRITIN 52  TIBC 448  IRON 17*  RETICCTPCT 2.5   Sepsis Labs: No results for input(s): PROCALCITON, LATICACIDVEN in the last 168 hours.  Recent Results (from the past 240 hour(s))  SARS Coronavirus 2 (CEPHEID - Performed in [place] hospital lab), Hosp Order     Status: None   Collection Time: 12/09/18  7:16 PM   Specimen: Nasopharyngeal Swab  Result Value Ref Range Status   SARS Coronavirus 2 NEGATIVE NEGATIVE Final    Comment: (NOTE) If result is NEGATIVE SARS-CoV-2 target nucleic acids are NOT DETECTED. The SARS-CoV-2 RNA is generally detectable in upper and lower  respiratory specimens during the acute phase of infection. The lowest  concentration of SARS-CoV-2 viral copies this assay can detect is 250  copies / mL. A negative result does not preclude SARS-CoV-2 infection  and should not be used as the sole basis for treatment or other  patient management decisions.  A negative result may occur with  improper specimen collection / handling, submission of specimen other  than nasopharyngeal swab, presence of viral mutation(s) within the  areas targeted by this assay, and inadequate number of viral copies  (<250 copies / mL). A negative result must be combined with clinical  observations, patient history, and epidemiological information. If result is POSITIVE SARS-CoV-2 target nucleic acids are DETECTED.  The SARS-CoV-2 RNA is generally detectable in upper and lower  respiratory specimens dur ing the acute phase of infection.  Positive  results are indicative of active infection with SARS-CoV-2.  Clinical  correlation with patient history and other diagnostic information is  necessary to determine patient infection status.  Positive results do  not rule out bacterial infection or co-infection with other viruses. If result is PRESUMPTIVE POSTIVE SARS-CoV-2 nucleic acids MAY BE PRESENT.   A presumptive positive result was obtained on the submitted specimen  and confirmed on repeat testing.  While 2019 novel coronavirus  (SARS-CoV-2) nucleic acids may be present in the submitted sample  additional confirmatory testing may be necessary for epidemiological  and / or clinical management purposes  to differentiate between  SARS-CoV-2 and other Sarbecovirus currently known to infect humans.  If clinically indicated additional testing with an alternate test  methodology [phone]) is advised. The SARS-CoV-2 RNA is generally  detectable in upper and lower respiratory sp ecimens during the acute  phase of infection. The expected result is Negative. Fact Sheet for Patients:  StrictlyIdeas.no Fact Sheet for Healthcare Providers: BankingDealers.co.za This test is not yet approved or cleared by the [place] FDA and has been  authorized for detection and/or diagnosis of SARS-CoV-2 by FDA under an Emergency Use Authorization (EUA).  This EUA will remain in effect (meaning this test can be used) for the duration of the COVID-19 declaration under Section 564(b)(1) of the Act, 21 U.S.C. section 360bbb-3(b)(1), unless the authorization is terminated or revoked sooner. Performed at [facility], [place] [address], [place], [place] [address]          Radiology Studies: Dg Hip Unilat With Pelvis 2-3 Views Right  Result Date: 12/11/2018 CLINICAL  DATA:  Fall.  Right hip pain. EXAM: DG HIP (WITH OR WITHOUT PELVIS) 2-3V RIGHT COMPARISON:  None. FINDINGS: No fracture.  No bone lesion. Hip joint normally spaced and aligned. Soft tissues are unremarkable. IMPRESSION: No fracture or dislocation. Electronically Signed   By: Lajean Manes M.D.   On: 12/11/2018 14:27        Scheduled Meds: . cholecalciferol  1,000 Units Oral Daily  . DULoxetine  30 mg Oral Daily  . ferrous sulfate  325 mg Oral Q breakfast  . heparin  5,000 Units Subcutaneous Q8H   Continuous Infusions:   LOS: 1 day     Cordelia Poche, MD [facility] 12/11/2018, 3:27 PM  If 7PM-7AM, please contact night-coverage [email or web address]

## 2018-12-11 NOTE — TOC Progression Note (Signed)
Transition of Care [facility]) - Progression Note    Patient Details  Name: SIGNE TACKITT MRN: [id number] Date of Birth: [birth year]  [facility]) CM/SW [place], LCSW Phone Number: 12/11/2018, 9:32 AM  Clinical Narrative:    CSW received insurance approval for [facility] from [place]: #[id number].   Expected Discharge Plan: Skilled Nursing Facility Barriers to Discharge: Ship broker, Continued Medical Work up  Expected Discharge Plan and Services Expected Discharge Plan: [place] In-house Referral: Clinical Social Work   Post Acute Care Choice: [place] Living arrangements for the past 2 months: Single Family Home                 DME Arranged: N/A         HH Arranged: NA           Social Determinants of Health (SDOH) Interventions    Readmission Risk Interventions No flowsheet data found.

## 2018-12-11 NOTE — Progress Notes (Signed)
Pt noted to have pulled out IV catheter . No bleeding from insertion site. To insert PIV access and restart  IV fluids .

## 2018-12-12 DIAGNOSIS — Z515 Encounter for palliative care: Secondary | ICD-10-CM | POA: Diagnosis not present

## 2018-12-12 DIAGNOSIS — R296 Repeated falls: Secondary | ICD-10-CM | POA: Diagnosis not present

## 2018-12-12 DIAGNOSIS — D509 Iron deficiency anemia, unspecified: Secondary | ICD-10-CM | POA: Diagnosis not present

## 2018-12-12 DIAGNOSIS — I951 Orthostatic hypotension: Principal | ICD-10-CM

## 2018-12-12 DIAGNOSIS — Z86718 Personal history of other venous thrombosis and embolism: Secondary | ICD-10-CM | POA: Diagnosis not present

## 2018-12-12 DIAGNOSIS — W19XXXA Unspecified fall, initial encounter: Secondary | ICD-10-CM | POA: Diagnosis not present

## 2018-12-12 DIAGNOSIS — D500 Iron deficiency anemia secondary to blood loss (chronic): Secondary | ICD-10-CM | POA: Diagnosis not present

## 2018-12-12 DIAGNOSIS — I10 Essential (primary) hypertension: Secondary | ICD-10-CM | POA: Diagnosis not present

## 2018-12-12 DIAGNOSIS — R5381 Other malaise: Secondary | ICD-10-CM | POA: Diagnosis not present

## 2018-12-12 DIAGNOSIS — D649 Anemia, unspecified: Secondary | ICD-10-CM | POA: Diagnosis not present

## 2018-12-12 DIAGNOSIS — F329 Major depressive disorder, single episode, unspecified: Secondary | ICD-10-CM | POA: Diagnosis not present

## 2018-12-12 DIAGNOSIS — E871 Hypo-osmolality and hyponatremia: Secondary | ICD-10-CM | POA: Diagnosis not present

## 2018-12-12 DIAGNOSIS — N179 Acute kidney failure, unspecified: Secondary | ICD-10-CM | POA: Diagnosis not present

## 2018-12-12 DIAGNOSIS — Z79899 Other long term (current) drug therapy: Secondary | ICD-10-CM | POA: Diagnosis not present

## 2018-12-12 DIAGNOSIS — E876 Hypokalemia: Secondary | ICD-10-CM | POA: Diagnosis not present

## 2018-12-12 DIAGNOSIS — I82402 Acute embolism and thrombosis of unspecified deep veins of left lower extremity: Secondary | ICD-10-CM | POA: Diagnosis not present

## 2018-12-12 DIAGNOSIS — F0390 Unspecified dementia without behavioral disturbance: Secondary | ICD-10-CM | POA: Diagnosis not present

## 2018-12-12 DIAGNOSIS — Z20828 Contact with and (suspected) exposure to other viral communicable diseases: Secondary | ICD-10-CM | POA: Diagnosis not present

## 2018-12-12 DIAGNOSIS — M2550 Pain in unspecified joint: Secondary | ICD-10-CM | POA: Diagnosis not present

## 2018-12-12 DIAGNOSIS — E559 Vitamin D deficiency, unspecified: Secondary | ICD-10-CM | POA: Diagnosis not present

## 2018-12-12 DIAGNOSIS — Z7401 Bed confinement status: Secondary | ICD-10-CM | POA: Diagnosis not present

## 2018-12-12 MED ORDER — METOPROLOL SUCCINATE ER 25 MG PO TB24: 12.5000 mg | ORAL_TABLET | Freq: Every day | ORAL | Status: AC

## 2018-12-12 MED ORDER — METOPROLOL SUCCINATE ER 25 MG PO TB24
12.5000 mg | ORAL_TABLET | Freq: Every day | ORAL | Status: DC
  Administered 2018-12-12: 10:00:00 12.5 mg via ORAL
  Filled 2018-12-12: qty 1

## 2018-12-12 MED ORDER — TRAMADOL HCL 50 MG PO TABS: 50.0000 mg | ORAL_TABLET | Freq: Four times a day (QID) | ORAL | 0 refills | Status: AC | PRN

## 2018-12-12 NOTE — Progress Notes (Signed)
Report given to [facility] at [facility] facility . PTAR here to get the patient now.  RN to continue to monitor.

## 2018-12-12 NOTE — TOC Transition Note (Signed)
Transition of Care [facility]) - CM/SW Discharge Note   Patient Details  Name: Shirley Wright MRN: [id number] Date of Birth: 12-30-1925  [facility] of [facility]) CM/SW Contact:  Sharin Mons, RN Phone Number: 12/12/2018, 11:18 AM   Clinical Narrative:    Patient will DC to: [facility] Anticipated DC date: 12/12/2018 Family notified: Salome Holmes Transport by: Corey Harold   Per MD patient ready for DC today . RN, patient, patient's family, and facility notified of DC. Discharge Summary and FL2 sent to facility. RN to call report prior to discharge [phone]). Pt' s RM # is 104 B. DC packet on chart. Ambulance transport requested for patient.   RNCM will sign off for now as intervention is no longer needed. Please consult [place] again if new needs arise.   Final next level of care: Skilled Nursing Facility Barriers to Discharge: Ship broker, Continued Medical Work up   Patient Goals and CMS Choice Patient states their goals for this hospitalization and ongoing recovery are:: Rehab CMS Medicare.gov Compare Post Acute Care list provided to:: Patient Represenative (must comment)(Daughter) Choice offered to / list presented to : Adult Children  Discharge Placement                       Discharge Plan and Services In-house Referral: Clinical Social Work   Post Acute Care Choice: [place]          DME Arranged: N/A         HH Arranged: NA          Social Determinants of Health (SDOH) Interventions     Readmission Risk Interventions No flowsheet data found.

## 2018-12-12 NOTE — Discharge Summary (Signed)
Physician Discharge Summary  Shirley Wright VZD:[id number] DOB: 04-10-1926 DOA: 12/09/2018  PCP: Laurey Morale, MD  Admit date: 12/09/2018 Discharge date: 12/12/2018  Admitted From: ALF Disposition: SNF  Recommendations for Outpatient Follow-up:  1. Follow up with PCP in 1 week 2. Please obtain BMP/CBC in one week 3. Palliative care referral 4. Please follow up on the following pending results: None  Discharge Condition: Stable CODE STATUS: DNR Diet recommendation: Regular diet   Brief/Interim Summary:  Admission HPI written by Ivor Costa, MD  HPI: Shirley Wright is a 85 y.o. female with medical history significant of hypertension, hyperlipidemia, GERD, depression, back pain, left leg DVT (was on Eliquis, which was stopped on 12/05/2018), dementia, pacemaker placement ? (per patient's report), who presents with fall.  Per report, pt was seen in ED yesterday after falling and hitting head in SNF. Pt states that she fell when she was walking and lost her balance. No LOC.  She denies unilateral weakness, numbness or tingling his extremities.  No facial droop, slurred speech, difficulty speaking, vision loss or hearing loss. She had Ct-head and CT of C spin yesterday which were negative for acute intracranial abnormalities or bony fracture. She was discharged back to SNF. She fell again twice today. Pt has small lace to right eyebrow which has been steri stripped. Pt has dementia at baseline, but she is oriented x3.  Answers most questions appropriately.  She denies any chest pain, shortness of breath, cough, fever or chills.  No nausea vomiting, diarrhea, abdominal pain, symptoms of UTI.  Patient denies rectal bleeding or dark stool.  ED Course: pt was found to have hemoglobin 8.5 (9.6 on 07/15/2018), WBC 11.3, pending COVID-19 test, negative FOBT test, potassium 3.4, sodium 130, AKI with creatinine 1.23, BUN 21, temperature normal, blood pressure 166/67, HR 73, oxygen sat 92 to  99% on room air.  Patient is placed on telemetry bed for observation.    Hospital course:  Orthostatic hypotension Appears to be the cause of her fall. Orthostatic vitals improved with adjustment of BP medications and IV fluids. Reduced metoprolol on discharge  Fall Some right hip pain. Laceration over right eye with some bruising. Right hip pain with negative x-ray for pelvis or hip fracture/dislocation. Pain management as needed.  Essential hypertension Elevated supine blood pressure. Significant orthostatic vitals as mentioned above. Resumed metoprolol succinate at 12.5 mg daily  Somnolence Possibly medication mediated. Discontinued trazodone. Resolved before discharge.  History of DVT Finished Eliquis course  Depression Continue Cymbalta  AKI Secondary to dehydration. Resolved.  Microcytic anemia Hemoglobin now stable. Unknown etiology. No blood transfusions per goals of care. Continue iron supplementation  Hypokalemia Resolved with supplementation  Hyponatremia Mild and now improved.  Discharge Diagnoses:  Principal Problem:   Fall Active Problems:   Essential hypertension   DVT (deep venous thrombosis) (HCC)   Depression   AKI (acute kidney injury) ([place])   Microcytic anemia   Hypokalemia   Hyponatremia   Frequent falls    Discharge Instructions  Discharge Instructions    Call MD for:  severe uncontrolled pain   Complete by: As directed    Increase activity slowly   Complete by: As directed      Allergies as of 12/12/2018      Reactions   Penicillins Other (See Comments)   "Allergic," per [facility] Did it involve swelling of the face/tongue/throat, SOB, or low BP? Unk Did it involve sudden or severe rash/hives, skin peeling, or any reaction on  the inside of your mouth or nose? Unk Did you need to seek medical attention at a hospital or doctor's office? Unk When did it last happen? Unk If all above answers are "NO", may proceed with  cephalosporin use.   Pneumococcal Vaccine Polyvalent Other (See Comments)   "Allergic," per [facility]   Tetanus-diphtheria Toxoids Td Other (See Comments)   "Allergic," per [facility]      Medication List    STOP taking these medications   apixaban 5 MG Tabs tablet Commonly known as: ELIQUIS   metoprolol tartrate 50 MG tablet Commonly known as: LOPRESSOR   traZODone 50 MG tablet Commonly known as: DESYREL     TAKE these medications   acetaminophen 325 MG tablet Commonly known as: TYLENOL Take 325 mg by mouth See admin instructions. Take 325 mg by mouth three times a day and an additional 650 mg up to three times a day as needed for back pain   cholecalciferol 25 MCG (1000 UT) tablet Commonly known as: VITAMIN D3 Take 1,000 Units by mouth daily.   DULoxetine 30 MG capsule Commonly known as: CYMBALTA Take 30-60 mg by mouth See admin instructions. Take 30 mg by mouth once a day for 2 weeks, then increase to 2 capsules (60 mg) once a day   ferrous sulfate 325 (65 FE) MG tablet Take 325 mg by mouth daily with breakfast.   metoprolol succinate 25 MG 24 hr tablet Commonly known as: TOPROL-XL Take 0.5 tablets (12.5 mg total) by mouth daily. Take with or immediately following a meal. What changed:   medication strength  how much to take   NON FORMULARY Take 120 mLs by mouth See admin instructions. MedPass: Drink 120 ml's by mouth 2 times a day   potassium chloride 10 MEQ tablet Commonly known as: Klor-Con 10 Take 1 tablet (10 mEq total) by mouth daily.   traMADol 50 MG tablet Commonly known as: ULTRAM Take 1 tablet (50 mg total) by mouth every 6 (six) hours as needed (for pain).      Contact information for after-discharge care    Destination    [facility] Preferred SNF .   Service: Skilled Nursing Contact information: [place] [phone]             Allergies  Allergen Reactions   Penicillins  Other (See Comments)    "Allergic," per [facility] Did it involve swelling of the face/tongue/throat, SOB, or low BP? Unk Did it involve sudden or severe rash/hives, skin peeling, or any reaction on the inside of your mouth or nose? Unk Did you need to seek medical attention at a hospital or doctor's office? Unk When did it last happen? Unk If all above answers are "NO", may proceed with cephalosporin use.    Pneumococcal Vaccine Polyvalent Other (See Comments)    "Allergic," per [facility]   Tetanus-Diphtheria Toxoids Td Other (See Comments)    "Allergic," per [facility]    Consultations:  Palliative care medicine   Procedures/Studies: Ct Head Wo Contrast  Result Date: 12/08/2018 CLINICAL DATA:  Right head lacerations and soft tissue swelling following a fall today. EXAM: CT HEAD WITHOUT CONTRAST CT CERVICAL SPINE WITHOUT CONTRAST TECHNIQUE: Multidetector CT imaging of the head and cervical spine was performed following the standard protocol without intravenous contrast. Multiplanar CT image reconstructions of the cervical spine were also generated. COMPARISON:  None. FINDINGS: CT HEAD FINDINGS Brain: Mild-to-moderate enlargement of the ventricles and subarachnoid spaces. Mild-to-moderate patchy white matter  low density in both cerebral hemispheres. No intracranial hemorrhage, mass lesion or CT evidence of acute infarction. Vascular: No hyperdense vessel or unexpected calcification. Skull: Bilateral hyperostosis frontalis. 2.2 x 1.1 cm oval, homogeneously densely calcified mass arising from the anterior aspect of the right frontal bone with a broad base against the skull. No skull fractures are seen. Sinuses/Orbits: Status post bilateral cataract extraction. Unremarkable bones and included paranasal sinuses. Other: Right posterolateral scalp hematoma. CT CERVICAL SPINE FINDINGS Alignment: Mild reversal the normal lordosis in the mid to lower cervical spine. Mild dextroconvex cervicothoracic scoliosis. Mild  retrolisthesis at the C4-5 and C5-6 levels. Skull base and vertebrae: No acute fracture. No primary bone lesion or focal pathologic process. Soft tissues and spinal canal: No prevertebral fluid or swelling. No visible canal hematoma. Disc levels: Multilevel degenerative changes, most pronounced at the C4-5, C5-6 and C6-7 levels. Upper chest: Clear lung apices. Other: None. IMPRESSION: 1. Right posterolateral scalp hematoma without skull fracture or intracranial hemorrhage. 2. No cervical spine fracture or traumatic subluxation. 3. Mild to moderate diffuse cerebral and cerebellar atrophy. 4. Mild to moderate chronic small vessel white matter ischemic changes in both cerebral hemispheres. 5. 2.2 x 1.1 cm right frontal old, calcified cephalohematoma or exostosis. 6. Multilevel cervical spine degenerative changes. Electronically Signed   By: Claudie Revering M.D.   On: 12/08/2018 09:41   Ct Cervical Spine Wo Contrast  Result Date: 12/08/2018 CLINICAL DATA:  Right head lacerations and soft tissue swelling following a fall today. EXAM: CT HEAD WITHOUT CONTRAST CT CERVICAL SPINE WITHOUT CONTRAST TECHNIQUE: Multidetector CT imaging of the head and cervical spine was performed following the standard protocol without intravenous contrast. Multiplanar CT image reconstructions of the cervical spine were also generated. COMPARISON:  None. FINDINGS: CT HEAD FINDINGS Brain: Mild-to-moderate enlargement of the ventricles and subarachnoid spaces. Mild-to-moderate patchy white matter low density in both cerebral hemispheres. No intracranial hemorrhage, mass lesion or CT evidence of acute infarction. Vascular: No hyperdense vessel or unexpected calcification. Skull: Bilateral hyperostosis frontalis. 2.2 x 1.1 cm oval, homogeneously densely calcified mass arising from the anterior aspect of the right frontal bone with a broad base against the skull. No skull fractures are seen. Sinuses/Orbits: Status post bilateral cataract extraction.  Unremarkable bones and included paranasal sinuses. Other: Right posterolateral scalp hematoma. CT CERVICAL SPINE FINDINGS Alignment: Mild reversal the normal lordosis in the mid to lower cervical spine. Mild dextroconvex cervicothoracic scoliosis. Mild retrolisthesis at the C4-5 and C5-6 levels. Skull base and vertebrae: No acute fracture. No primary bone lesion or focal pathologic process. Soft tissues and spinal canal: No prevertebral fluid or swelling. No visible canal hematoma. Disc levels: Multilevel degenerative changes, most pronounced at the C4-5, C5-6 and C6-7 levels. Upper chest: Clear lung apices. Other: None. IMPRESSION: 1. Right posterolateral scalp hematoma without skull fracture or intracranial hemorrhage. 2. No cervical spine fracture or traumatic subluxation. 3. Mild to moderate diffuse cerebral and cerebellar atrophy. 4. Mild to moderate chronic small vessel white matter ischemic changes in both cerebral hemispheres. 5. 2.2 x 1.1 cm right frontal old, calcified cephalohematoma or exostosis. 6. Multilevel cervical spine degenerative changes. Electronically Signed   By: Claudie Revering M.D.   On: 12/08/2018 09:41   Dg Hip Unilat With Pelvis 2-3 Views Right  Result Date: 12/11/2018 CLINICAL DATA:  Fall.  Right hip pain. EXAM: DG HIP (WITH OR WITHOUT PELVIS) 2-3V RIGHT COMPARISON:  None. FINDINGS: No fracture.  No bone lesion. Hip joint normally spaced and aligned. Soft tissues are unremarkable.  IMPRESSION: No fracture or dislocation. Electronically Signed   By: Lajean Manes M.D.   On: 12/11/2018 14:27      Subjective: No issues overnight. Patient reports no pain currently.  Discharge Exam: Vitals:   12/12/18 0831 12/12/18 0835  BP: (!) 154/65 (!) 163/63  Pulse: 85 84  Resp: 17 18  Temp:    SpO2: 95% 96%   Vitals:   12/11/18 2011 12/12/18 0829 12/12/18 0831 12/12/18 0835  BP: (!) 150/63 (!) 141/70 (!) 154/65 (!) 163/63  Pulse: (!) 106 82 85 84  Resp: 16 17 17 18   Temp: 98.4 F  (36.9 C)     TempSrc: Oral     SpO2: 97% 95% 95% 96%  Height:        General: Pt is alert, awake, not in acute distress Cardiovascular: RRR, S1/S2 +, no rubs, no gallops Respiratory: CTA bilaterally, no wheezing, no rhonchi Abdominal: Soft, NT, ND, bowel sounds + Extremities: no edema, no cyanosis    The results of significant diagnostics from this hospitalization (including imaging, microbiology, ancillary and laboratory) are listed below for reference.     Microbiology: Recent Results (from the past 240 hour(s))  SARS Coronavirus 2 (CEPHEID - Performed in [place] hospital lab), Hosp Order     Status: None   Collection Time: 12/09/18  7:16 PM   Specimen: Nasopharyngeal Swab  Result Value Ref Range Status   SARS Coronavirus 2 NEGATIVE NEGATIVE Final    Comment: (NOTE) If result is NEGATIVE SARS-CoV-2 target nucleic acids are NOT DETECTED. The SARS-CoV-2 RNA is generally detectable in upper and lower  respiratory specimens during the acute phase of infection. The lowest  concentration of SARS-CoV-2 viral copies this assay can detect is 250  copies / mL. A negative result does not preclude SARS-CoV-2 infection  and should not be used as the sole basis for treatment or other  patient management decisions.  A negative result may occur with  improper specimen collection / handling, submission of specimen other  than nasopharyngeal swab, presence of viral mutation(s) within the  areas targeted by this assay, and inadequate number of viral copies  (<250 copies / mL). A negative result must be combined with clinical  observations, patient history, and epidemiological information. If result is POSITIVE SARS-CoV-2 target nucleic acids are DETECTED. The SARS-CoV-2 RNA is generally detectable in upper and lower  respiratory specimens dur ing the acute phase of infection.  Positive  results are indicative of active infection with SARS-CoV-2.  Clinical  correlation with patient  history and other diagnostic information is  necessary to determine patient infection status.  Positive results do  not rule out bacterial infection or co-infection with other viruses. If result is PRESUMPTIVE POSTIVE SARS-CoV-2 nucleic acids MAY BE PRESENT.   A presumptive positive result was obtained on the submitted specimen  and confirmed on repeat testing.  While 2019 novel coronavirus  (SARS-CoV-2) nucleic acids may be present in the submitted sample  additional confirmatory testing may be necessary for epidemiological  and / or clinical management purposes  to differentiate between  SARS-CoV-2 and other Sarbecovirus currently known to infect humans.  If clinically indicated additional testing with an alternate test  methodology [phone]) is advised. The SARS-CoV-2 RNA is generally  detectable in upper and lower respiratory sp ecimens during the acute  phase of infection. The expected result is Negative. Fact Sheet for Patients:  StrictlyIdeas.no Fact Sheet for Healthcare Providers: BankingDealers.co.za This test is not yet approved or cleared by  the [employer] and has been authorized for detection and/or diagnosis of SARS-CoV-2 by FDA under an Emergency Use Authorization (EUA).  This EUA will remain in effect (meaning this test can be used) for the duration of the COVID-19 declaration under Section 564(b)(1) of the Act, 21 U.S.C. section 360bbb-3(b)(1), unless the authorization is terminated or revoked sooner. Performed at [facility], [place] [address] [place], [place] [address]      Labs: BNP (last 3 results) No results for input(s): BNP in the last 8760 hours. Basic Metabolic Panel: Recent Labs  Lab 12/09/18 1648 12/10/18 0238  NA 130* 134*  K 3.4* 4.0  CL 95* 102  CO2 23 25  GLUCOSE 145* 123*  BUN 21 14  CREATININE 1.23* 0.86  CALCIUM 9.4 9.0  MG  --  1.8   Liver Function Tests: Recent Labs  Lab  12/10/18 1646  AST 19  ALT 13  ALKPHOS 65  BILITOT 0.6  PROT 6.5  ALBUMIN 3.2*   No results for input(s): LIPASE, AMYLASE in the last 168 hours. No results for input(s): AMMONIA in the last 168 hours. CBC: Recent Labs  Lab 12/09/18 1648 12/10/18 0238 12/10/18 1646  WBC 11.3* 7.3 7.9  HGB 8.5* 7.7* 8.0*  HCT 31.4* 27.3* 28.2*  MCV 77.0* 76.3* 75.8*  PLT 311 263 265   Cardiac Enzymes: No results for input(s): CKTOTAL, CKMB, CKMBINDEX, TROPONINI in the last 168 hours. BNP: Invalid input(s): POCBNP CBG: No results for input(s): GLUCAP in the last 168 hours. D-Dimer No results for input(s): DDIMER in the last 72 hours. Hgb A1c No results for input(s): HGBA1C in the last 72 hours. Lipid Profile No results for input(s): CHOL, HDL, LDLCALC, TRIG, CHOLHDL, LDLDIRECT in the last 72 hours. Thyroid function studies No results for input(s): TSH, T4TOTAL, T3FREE, THYROIDAB in the last 72 hours.  Invalid input(s): FREET3 Anemia work up Recent Labs    12/09/18 1845  VITAMINB12 437  FOLATE 13.5  FERRITIN 52  TIBC 448  IRON 17*  RETICCTPCT 2.5   Urinalysis    Component Value Date/Time   COLORURINE STRAW (A) 12/10/2018 [place] 12/10/2018 0653   LABSPEC 1.006 12/10/2018 [place] 7.0 12/10/2018 [place] 12/10/2018 [place] NEGATIVE 12/10/2018 0653   HGBUR negative 01/17/2009 0942   BILIRUBINUR NEGATIVE 12/10/2018 0653   BILIRUBINUR n 05/15/2018 1209   KETONESUR NEGATIVE 12/10/2018 0653   PROTEINUR NEGATIVE 12/10/2018 0653   UROBILINOGEN 0.2 05/15/2018 1209   UROBILINOGEN 0.2 01/17/2009 0942   NITRITE NEGATIVE 12/10/2018 0653   LEUKOCYTESUR SMALL (A) 12/10/2018 0653   Sepsis Labs Invalid input(s): PROCALCITONIN,  WBC,  LACTICIDVEN Microbiology Recent Results (from the past 240 hour(s))  SARS Coronavirus 2 (CEPHEID - Performed in [place] hospital lab), Hosp Order     Status: None   Collection Time: 12/09/18  7:16 PM    Specimen: Nasopharyngeal Swab  Result Value Ref Range Status   SARS Coronavirus 2 NEGATIVE NEGATIVE Final    Comment: (NOTE) If result is NEGATIVE SARS-CoV-2 target nucleic acids are NOT DETECTED. The SARS-CoV-2 RNA is generally detectable in upper and lower  respiratory specimens during the acute phase of infection. The lowest  concentration of SARS-CoV-2 viral copies this assay can detect is 250  copies / mL. A negative result does not preclude SARS-CoV-2 infection  and should not be used as the sole basis for treatment or other  patient management decisions.  A  negative result may occur with  improper specimen collection / handling, submission of specimen other  than nasopharyngeal swab, presence of viral mutation(s) within the  areas targeted by this assay, and inadequate number of viral copies  (<250 copies / mL). A negative result must be combined with clinical  observations, patient history, and epidemiological information. If result is POSITIVE SARS-CoV-2 target nucleic acids are DETECTED. The SARS-CoV-2 RNA is generally detectable in upper and lower  respiratory specimens dur ing the acute phase of infection.  Positive  results are indicative of active infection with SARS-CoV-2.  Clinical  correlation with patient history and other diagnostic information is  necessary to determine patient infection status.  Positive results do  not rule out bacterial infection or co-infection with other viruses. If result is PRESUMPTIVE POSTIVE SARS-CoV-2 nucleic acids MAY BE PRESENT.   A presumptive positive result was obtained on the submitted specimen  and confirmed on repeat testing.  While 2019 novel coronavirus  (SARS-CoV-2) nucleic acids may be present in the submitted sample  additional confirmatory testing may be necessary for epidemiological  and / or clinical management purposes  to differentiate between  SARS-CoV-2 and other Sarbecovirus currently known to infect humans.  If  clinically indicated additional testing with an alternate test  methodology [phone]) is advised. The SARS-CoV-2 RNA is generally  detectable in upper and lower respiratory sp ecimens during the acute  phase of infection. The expected result is Negative. Fact Sheet for Patients:  StrictlyIdeas.no Fact Sheet for Healthcare Providers: BankingDealers.co.za This test is not yet approved or cleared by the [place] FDA and has been authorized for detection and/or diagnosis of SARS-CoV-2 by FDA under an Emergency Use Authorization (EUA).  This EUA will remain in effect (meaning this test can be used) for the duration of the COVID-19 declaration under Section 564(b)(1) of the Act, 21 U.S.C. section 360bbb-3(b)(1), unless the authorization is terminated or revoked sooner. Performed at [facility], [place] [address] [place], [place] [address]      Time coordinating discharge: Over 30 minutes  SIGNED:   Cordelia Poche, MD [facility] 12/12/2018, 10:04 AM

## 2018-12-12 NOTE — Progress Notes (Signed)
Pt discharged with PTAR. Pt a/o X 4, showing no signs of distress. All belongings sent home with patient. Daughter aware if discharge. RN to continue to monitor.

## 2018-12-12 NOTE — Discharge Instructions (Signed)
Dayleen B Basto,  During the hospital because of a fall.  He suffered injury to the brow of her right eye.  You are found to have low blood pressure while standing which is called orthostatic hypotension.  Your blood pressure medications have been adjusted and her blood pressure is improved.  You will be discharged today skilled nursing facility.  Please stop taking the trazodone as this may have been contributing to some confusion and sleepiness.

## 2018-12-12 NOTE — Care Management Important Message (Signed)
Important Message  Patient Details  Name: AALIVIA MCGRAW MRN: [id number] Date of Birth: Feb 18, 1926   Medicare Important Message Given:  Yes     Janeli Lewison 12/12/2018, 2:13 PM

## 2018-12-14 DIAGNOSIS — D500 Iron deficiency anemia secondary to blood loss (chronic): Secondary | ICD-10-CM | POA: Diagnosis not present

## 2018-12-14 DIAGNOSIS — F0390 Unspecified dementia without behavioral disturbance: Secondary | ICD-10-CM | POA: Diagnosis not present

## 2018-12-14 DIAGNOSIS — Z86718 Personal history of other venous thrombosis and embolism: Secondary | ICD-10-CM | POA: Diagnosis not present

## 2018-12-14 DIAGNOSIS — I10 Essential (primary) hypertension: Secondary | ICD-10-CM | POA: Diagnosis not present

## 2018-12-19 ENCOUNTER — Other Ambulatory Visit: Payer: Self-pay

## 2018-12-19 ENCOUNTER — Non-Acute Institutional Stay: Payer: Medicare Other | Admitting: Adult Health Nurse Practitioner

## 2018-12-19 DIAGNOSIS — Z515 Encounter for palliative care: Secondary | ICD-10-CM | POA: Diagnosis not present

## 2018-12-19 NOTE — Progress Notes (Addendum)
[facility] Consult Note Telephone: [phone]  Fax: [phone]  PATIENT NAME: Shirley Wright DOB: 1925-09-27 MRN: [id number]  PRIMARY CARE PROVIDER:   Laurey Morale, MD  REFERRING PROVIDER: Dr. Claris Wright  RESPONSIBLE PARTY:   Shirley Wright, daughter [phone]   Due to the COVID-19 crisis, this visit was done via telemedicine and it was initiated and consent by this patient and or family.  Visit via Zoom facilitated by Shirley Wright, SW at facility.  Daughter, Shirley Wright, was also on the Zoom visit.  RECOMMENDATIONS and PLAN:  1.  Dementia.  FAST 6c.  Patient has occasional incontinence of bladder and is continent of bowel.  Requires one person assist with ADLs.  Is able to feed herself.  Though has had decreased oral intake and has lost 10-15 pounds over the past 2-3 months. Uses a walker to ambulate.  Does have issues with wandering and will try to get up unassisted and has had several falls recently. Frequently has to be near the nurses' station for close monitoring.  Was at [facility] and went to hospital on 12/11/2018 and then was transferred to [facility].  Is currently receiving ST/PT/OT.  Staff reports that patient is doing okay with therapy and will follow simple commands.  Though does require repeated instruction due to her dementia.  Continue therapy as ordered and supportive care.  Continue toileting schedule as this seems to be the main reason she gets up unassisted.  2.  Appetite.  See above about weight loss.  Staff does report that she is eating better at the facility.  Is ordered med pass for supplemental nutrition.  Last weight on 7/26 was 123.8 pounds.  Daughter states that early June she was 128 pounds.  Continue monitoring weight for changes and offering supplemental nutrition.  3.  Goals of care.  Patient is DNR.  Plan is to transition patient to LTC after therapy is done. Patient seems to be doing better with the  extra attention from therapy and is starting to eat better.  Will continue to monitor and have next Zoom visit on August 17th.  Will reevaluate at that time to see how she is doing.  Will continue palliative care for now and monitor for any decline warranting transition to hospice.  Daughter on board with hospice when that time comes.  I spent 60 minutes providing this consultation,  from 9:30 to 10:30. More than 50% of the time in this consultation was spent coordinating communication.   HISTORY OF PRESENT ILLNESS:  Shirley Wright is a 85 y.o. year old female with multiple medical problems including dementia, HTN, depression, OA. Palliative Care was asked to help address goals of care.   CODE STATUS: DNR  PPS: 40% HOSPICE ELIGIBILITY/DIAGNOSIS: TBD  PHYSICAL EXAM:   General: lying in bed in NAD, frail appearing, thin Neurological: Weakness; A&O to person only  PAST MEDICAL HISTORY:  Past Medical History:  Diagnosis Date  . Adenomatous colon polyp    tubular  . Allergic rhinitis   . Diverticulosis   . GERD (gastroesophageal reflux disease)   . Hemorrhoids   . Hepatic cyst   . History of colonic polyps   . Hyperlipidemia   . Hypertension   . IBS (irritable bowel syndrome)   . Osteoarthritis     SOCIAL HX:  Social History   Tobacco Use  . Smoking status: Never Smoker  . Smokeless tobacco: Never Used  Substance Use Topics  .  Alcohol use: No    Alcohol/week: 0.0 standard drinks    Comment: once every 2-3 months    ALLERGIES:  Allergies  Allergen Reactions  . Penicillins Other (See Comments)    "Allergic," per [facility] Did it involve swelling of the face/tongue/throat, SOB, or low BP? Unk Did it involve sudden or severe rash/hives, skin peeling, or any reaction on the inside of your mouth or nose? Unk Did you need to seek medical attention at a hospital or doctor's office? Unk When did it last happen? Unk If all above answers are "NO", may proceed with cephalosporin use.    . Pneumococcal Vaccine Polyvalent Other (See Comments)    "Allergic," per [facility]  . Tetanus-Diphtheria Toxoids Td Other (See Comments)    "Allergic," per [facility]     PERTINENT MEDICATIONS:  Outpatient Encounter Medications as of 12/19/2018  Medication Sig  . acetaminophen (TYLENOL) 325 MG tablet Take 325 mg by mouth See admin instructions. Take 325 mg by mouth three times a day and an additional 650 mg up to three times a day as needed for back pain  . cholecalciferol (VITAMIN D3) 25 MCG (1000 UT) tablet Take 1,000 Units by mouth daily.  . DULoxetine (CYMBALTA) 30 MG capsule Take 30-60 mg by mouth See admin instructions. Take 30 mg by mouth once a day for 2 weeks, then increase to 2 capsules (60 mg) once a day  . ferrous sulfate 325 (65 FE) MG tablet Take 325 mg by mouth daily with breakfast.  . metoprolol succinate (TOPROL-XL) 25 MG 24 hr tablet Take 0.5 tablets (12.5 mg total) by mouth daily. Take with or immediately following a meal.  . NON FORMULARY Take 120 mLs by mouth See admin instructions. MedPass: Drink 120 ml's by mouth 2 times a day  . potassium chloride (KLOR-CON 10) 10 MEQ tablet Take 1 tablet (10 mEq total) by mouth daily.  . traMADol (ULTRAM) 50 MG tablet Take 1 tablet (50 mg total) by mouth every 6 (six) hours as needed (for pain).   No facility-administered encounter medications on file as of 12/19/2018.       Shirley Wright Jenetta Downer, NP

## 2019-01-05 ENCOUNTER — Other Ambulatory Visit: Payer: Self-pay

## 2019-01-05 ENCOUNTER — Non-Acute Institutional Stay: Payer: Medicare Other | Admitting: Adult Health Nurse Practitioner

## 2019-01-05 DIAGNOSIS — Z515 Encounter for palliative care: Secondary | ICD-10-CM

## 2019-01-05 DIAGNOSIS — Z20828 Contact with and (suspected) exposure to other viral communicable diseases: Secondary | ICD-10-CM | POA: Diagnosis not present

## 2019-01-05 NOTE — Progress Notes (Addendum)
[facility] Consult Note Telephone: [phone]  Fax: [phone]  PATIENT NAME: Shirley Wright DOB: [birth year] MRN: [id number]  PRIMARY CARE PROVIDER:   Laurey Morale, MD  REFERRING PROVIDER: Dr. Claris Gower  RESPONSIBLE PARTY:    [name], daughter [phone]   Due to the COVID-19 crisis, this visit was done via telemedicine and it was initiated and consent by this patient and or family.  Visit via Zoom facilitated by Madelin Rear, SW at facility.  Daughter, [name], was also on the Zoom visit.      RECOMMENDATIONS and PLAN:  1.  Goals of care.  Patient is DNR.  Plan is to transition patient to LTC after therapy is done. Patient is no longer on PT/OT but is still getting ST services. She has done much better and will walk with her walker up and down the halls.  Has had no reported falls, infections, or hospital visits since last visit.   Will continue palliative care and monitor for any decline warranting transition to hospice.  Daughter on board with hospice when that time comes.  2.  Dementia.  FAST 6c.  Patient has occasional incontinence of bladder and is continent of bowel.  Requires one person assist with ADLs.  Is able to feed herself. Patient is able to get up and walk with her walker.  She no longer works with PT/OT but staff reports that if she isn't already working with [facility] that they could get her working with them.  No reports of combativeness but patient will wander and get up unassisted.  Continue supportive care.  3. Appetite.  Her appetite has been improving and she eats mainly 50-75% of her meals.  She has lost 2-3 pounds since last month.  This could be due to increased activity.  Recommend weekly weights for 4 weeks to make sure this is not a trend  VS reported by staff weight 121.4  HR 82  Temp 98.5  BP 142/84  I spent 30 minutes providing this consultation,  from 2:00 to 2:30. More than 50% of the  time in this consultation was spent coordinating communication.   HISTORY OF PRESENT ILLNESS:  Shirley Wright is a 85 y.o. year old female with multiple medical problems including dementia, HTN, depression, OA. Palliative Care was asked to help address goals of care.   CODE STATUS: DNR  PPS: 40% HOSPICE ELIGIBILITY/DIAGNOSIS: TBD  PHYSICAL EXAM:   General: lying in bed in NAD, frail appearing, thin Neurological: Weakness; A&O to person only  PAST MEDICAL HISTORY:  Past Medical History:  Diagnosis Date  . Adenomatous colon polyp    tubular  . Allergic rhinitis   . Diverticulosis   . GERD (gastroesophageal reflux disease)   . Hemorrhoids   . Hepatic cyst   . History of colonic polyps   . Hyperlipidemia   . Hypertension   . IBS (irritable bowel syndrome)   . Osteoarthritis     SOCIAL HX:  Social History   Tobacco Use  . Smoking status: Never Smoker  . Smokeless tobacco: Never Used  Substance Use Topics  . Alcohol use: No    Alcohol/week: 0.0 standard drinks    Comment: once every 2-3 months    ALLERGIES:  Allergies  Allergen Reactions  . Penicillins Other (See Comments)    "Allergic," per [facility] Did it involve swelling of the face/tongue/throat, SOB, or low BP? Unk Did it involve sudden or severe rash/hives, skin peeling, or  any reaction on the inside of your mouth or nose? Unk Did you need to seek medical attention at a hospital or doctor's office? Unk When did it last happen? Unk If all above answers are "NO", may proceed with cephalosporin use.   . Pneumococcal Vaccine Polyvalent Other (See Comments)    "Allergic," per [facility]  . Tetanus-Diphtheria Toxoids Td Other (See Comments)    "Allergic," per [facility]     PERTINENT MEDICATIONS:  Outpatient Encounter Medications as of 01/05/2019  Medication Sig  . acetaminophen (TYLENOL) 325 MG tablet Take 325 mg by mouth See admin instructions. Take 325 mg by mouth three times a day and an additional 650 mg up to three times  a day as needed for back pain  . cholecalciferol (VITAMIN D3) 25 MCG (1000 UT) tablet Take 1,000 Units by mouth daily.  . DULoxetine (CYMBALTA) 30 MG capsule Take 30-60 mg by mouth See admin instructions. Take 30 mg by mouth once a day for 2 weeks, then increase to 2 capsules (60 mg) once a day  . ferrous sulfate 325 (65 FE) MG tablet Take 325 mg by mouth daily with breakfast.  . metoprolol succinate (TOPROL-XL) 25 MG 24 hr tablet Take 0.5 tablets (12.5 mg total) by mouth daily. Take with or immediately following a meal.  . NON FORMULARY Take 120 mLs by mouth See admin instructions. MedPass: Drink 120 ml's by mouth 2 times a day  . potassium chloride (KLOR-CON 10) 10 MEQ tablet Take 1 tablet (10 mEq total) by mouth daily.  . traMADol (ULTRAM) 50 MG tablet Take 1 tablet (50 mg total) by mouth every 6 (six) hours as needed (for pain).   No facility-administered encounter medications on file as of 01/05/2019.      Virgene Tirone Jenetta Downer, NP

## 2019-01-09 DIAGNOSIS — D509 Iron deficiency anemia, unspecified: Secondary | ICD-10-CM | POA: Diagnosis not present

## 2019-01-09 DIAGNOSIS — D649 Anemia, unspecified: Secondary | ICD-10-CM | POA: Diagnosis not present

## 2019-01-09 DIAGNOSIS — I10 Essential (primary) hypertension: Secondary | ICD-10-CM | POA: Diagnosis not present

## 2019-01-09 DIAGNOSIS — Z79899 Other long term (current) drug therapy: Secondary | ICD-10-CM | POA: Diagnosis not present

## 2019-01-21 DIAGNOSIS — G4700 Insomnia, unspecified: Secondary | ICD-10-CM | POA: Diagnosis not present

## 2019-01-21 DIAGNOSIS — R4189 Other symptoms and signs involving cognitive functions and awareness: Secondary | ICD-10-CM | POA: Diagnosis not present

## 2019-01-21 DIAGNOSIS — F419 Anxiety disorder, unspecified: Secondary | ICD-10-CM | POA: Diagnosis not present

## 2019-01-21 DIAGNOSIS — F339 Major depressive disorder, recurrent, unspecified: Secondary | ICD-10-CM | POA: Diagnosis not present

## 2019-02-01 DIAGNOSIS — I10 Essential (primary) hypertension: Secondary | ICD-10-CM | POA: Diagnosis not present

## 2019-02-01 DIAGNOSIS — F0390 Unspecified dementia without behavioral disturbance: Secondary | ICD-10-CM | POA: Diagnosis not present

## 2019-02-01 DIAGNOSIS — Z86718 Personal history of other venous thrombosis and embolism: Secondary | ICD-10-CM | POA: Diagnosis not present

## 2019-02-01 DIAGNOSIS — D509 Iron deficiency anemia, unspecified: Secondary | ICD-10-CM | POA: Diagnosis not present

## 2019-02-02 DIAGNOSIS — Z20828 Contact with and (suspected) exposure to other viral communicable diseases: Secondary | ICD-10-CM | POA: Diagnosis not present

## 2019-02-04 DIAGNOSIS — F419 Anxiety disorder, unspecified: Secondary | ICD-10-CM | POA: Diagnosis not present

## 2019-02-04 DIAGNOSIS — F339 Major depressive disorder, recurrent, unspecified: Secondary | ICD-10-CM | POA: Diagnosis not present

## 2019-02-04 DIAGNOSIS — G4700 Insomnia, unspecified: Secondary | ICD-10-CM | POA: Diagnosis not present

## 2019-02-04 DIAGNOSIS — R4189 Other symptoms and signs involving cognitive functions and awareness: Secondary | ICD-10-CM | POA: Diagnosis not present

## 2019-02-18 DIAGNOSIS — F339 Major depressive disorder, recurrent, unspecified: Secondary | ICD-10-CM | POA: Diagnosis not present

## 2019-02-18 DIAGNOSIS — F0390 Unspecified dementia without behavioral disturbance: Secondary | ICD-10-CM | POA: Diagnosis not present

## 2019-02-18 DIAGNOSIS — G4700 Insomnia, unspecified: Secondary | ICD-10-CM | POA: Diagnosis not present

## 2019-02-18 DIAGNOSIS — F419 Anxiety disorder, unspecified: Secondary | ICD-10-CM | POA: Diagnosis not present

## 2019-02-23 ENCOUNTER — Encounter (HOSPITAL_COMMUNITY): Payer: Self-pay

## 2019-02-23 ENCOUNTER — Emergency Department (HOSPITAL_COMMUNITY): Payer: Medicare Other

## 2019-02-23 ENCOUNTER — Other Ambulatory Visit: Payer: Self-pay

## 2019-02-23 ENCOUNTER — Emergency Department (HOSPITAL_COMMUNITY)
Admission: EM | Admit: 2019-02-23 | Discharge: 2019-02-23 | Disposition: A | Discharge status: Skilled Nursing Facility | Payer: Medicare Other | Attending: Emergency Medicine | Admitting: Emergency Medicine

## 2019-02-23 DIAGNOSIS — S0990XA Unspecified injury of head, initial encounter: Secondary | ICD-10-CM

## 2019-02-23 DIAGNOSIS — Y92129 Unspecified place in nursing home as the place of occurrence of the external cause: Secondary | ICD-10-CM | POA: Diagnosis not present

## 2019-02-23 DIAGNOSIS — W19XXXA Unspecified fall, initial encounter: Secondary | ICD-10-CM | POA: Diagnosis not present

## 2019-02-23 DIAGNOSIS — Y939 Activity, unspecified: Secondary | ICD-10-CM | POA: Insufficient documentation

## 2019-02-23 DIAGNOSIS — Y999 Unspecified external cause status: Secondary | ICD-10-CM | POA: Diagnosis not present

## 2019-02-23 DIAGNOSIS — I10 Essential (primary) hypertension: Secondary | ICD-10-CM | POA: Insufficient documentation

## 2019-02-23 DIAGNOSIS — F0390 Unspecified dementia without behavioral disturbance: Secondary | ICD-10-CM | POA: Insufficient documentation

## 2019-02-23 DIAGNOSIS — S0101XA Laceration without foreign body of scalp, initial encounter: Secondary | ICD-10-CM | POA: Diagnosis not present

## 2019-02-23 DIAGNOSIS — S199XXA Unspecified injury of neck, initial encounter: Secondary | ICD-10-CM | POA: Diagnosis not present

## 2019-02-23 DIAGNOSIS — R58 Hemorrhage, not elsewhere classified: Secondary | ICD-10-CM | POA: Diagnosis not present

## 2019-02-23 DIAGNOSIS — M546 Pain in thoracic spine: Secondary | ICD-10-CM | POA: Diagnosis not present

## 2019-02-23 DIAGNOSIS — Y929 Unspecified place or not applicable: Secondary | ICD-10-CM | POA: Insufficient documentation

## 2019-02-23 LAB — COMPREHENSIVE METABOLIC PANEL
ALT: 14 U/L (ref 0–44)
AST: 18 U/L (ref 15–41)
Albumin: 3.4 g/dL — ABNORMAL LOW (ref 3.5–5.0)
Alkaline Phosphatase: 78 U/L (ref 38–126)
Anion gap: 10 (ref 5–15)
BUN: 20 mg/dL (ref 8–23)
CO2: 25 mmol/L (ref 22–32)
Calcium: 8.9 mg/dL (ref 8.9–10.3)
Chloride: 97 mmol/L — ABNORMAL LOW (ref 98–111)
Creatinine, Ser: 0.85 mg/dL (ref 0.44–1.00)
GFR calc Af Amer: >60 mL/min (ref >60)
GFR calc non Af Amer: 60 mL/min — ABNORMAL LOW (ref >60)
Glucose, Bld: 108 mg/dL — ABNORMAL HIGH (ref 70–99)
Potassium: 4.6 mmol/L (ref 3.5–5.1)
Sodium: 132 mmol/L — ABNORMAL LOW (ref 135–145)
Total Bilirubin: 0.4 mg/dL (ref 0.3–1.2)
Total Protein: 6.7 g/dL (ref 6.5–8.1)

## 2019-02-23 LAB — URINALYSIS, ROUTINE W REFLEX MICROSCOPIC
Bilirubin Urine: NEGATIVE
Glucose, UA: NEGATIVE mg/dL
Hgb urine dipstick: NEGATIVE
Ketones, ur: NEGATIVE mg/dL
Leukocytes,Ua: NEGATIVE
Nitrite: NEGATIVE
Protein, ur: NEGATIVE mg/dL
Specific Gravity, Urine: 1.018 (ref 1.005–1.030)
pH: 6 (ref 5.0–8.0)

## 2019-02-23 LAB — CBC WITH DIFFERENTIAL/PLATELET
Abs Immature Granulocytes: 0.02 10*3/uL (ref 0.00–0.07)
Basophils Absolute: 0 10*3/uL (ref 0.0–0.1)
Basophils Relative: 0 %
Eosinophils Absolute: 0.1 10*3/uL (ref 0.0–0.5)
Eosinophils Relative: 2 %
HCT: 41.3 % (ref 36.0–46.0)
Hemoglobin: 13.2 g/dL (ref 12.0–15.0)
Immature Granulocytes: 0 %
Lymphocytes Relative: 29 %
Lymphs Abs: 2 10*3/uL (ref 0.7–4.0)
MCH: 30.1 pg (ref 26.0–34.0)
MCHC: 32 g/dL (ref 30.0–36.0)
MCV: 94.3 fL (ref 80.0–100.0)
Monocytes Absolute: 0.6 10*3/uL (ref 0.1–1.0)
Monocytes Relative: 9 %
Neutro Abs: 4 10*3/uL (ref 1.7–7.7)
Neutrophils Relative %: 60 %
Platelets: 256 10*3/uL (ref 150–400)
RBC: 4.38 MIL/uL (ref 3.87–5.11)
RDW: 16 % — ABNORMAL HIGH (ref 11.5–15.5)
WBC: 6.7 10*3/uL (ref 4.0–10.5)
nRBC: 0 % (ref 0.0–0.2)

## 2019-02-23 NOTE — ED Provider Notes (Signed)
[name] DEPT Provider Note   CSN: [id number] Arrival date & time: 02/23/19  1550     History   Chief Complaint Chief Complaint  Patient presents with   Fall   Head Laceration    HPI Shirley Wright is a 85 y.o. female.     HPI   Patient is a 85 year old female with a history of diverticulosis, GERD, hyperlipidemia, hypertension, osteoarthritis, who presents to the emergency department from collapse nursing home for evaluation of a fall.  Patient has a history of dementia at baseline and is unable to provide detailed history therefore there is a level 5 caveat secondary to this.  She does deny having any pain my exam.  She states she has some blood on her head.  4:45 PM discussed case with Anderson Malta from [facility] who is aware of the patient.  She states that patient had a fall in her room today.  She has a roommate who heard the fall and immediately called for help.  Patient was on the fall only for a few minutes.  They are unsure how the patient fell.  Prior to this she has been in her normal state of health.  She is had no fevers or other complaints.  Past Medical History:  Diagnosis Date   Adenomatous colon polyp    tubular   Allergic rhinitis    Diverticulosis    GERD (gastroesophageal reflux disease)    Hemorrhoids    Hepatic cyst    History of colonic polyps    Hyperlipidemia    Hypertension    IBS (irritable bowel syndrome)    Osteoarthritis     Patient Active Problem List   Diagnosis Date Noted   Frequent falls    Fall 12/09/2018   Depression 12/09/2018   AKI (acute kidney injury) ([place]) 12/09/2018   Microcytic anemia 12/09/2018   Hypokalemia 12/09/2018   Hyponatremia 12/09/2018   Acute deep vein thrombosis (DVT) of left lower extremity ([place]) 07/15/2018   DVT (deep venous thrombosis) ([place]) 07/14/2018   Onychomycosis 02/20/2018   Memory loss 02/20/2018   GERD 04/16/2007   IRRITABLE  BOWEL SYNDROME 04/16/2007   Osteoarthritis 04/16/2007   BACK PAIN, LUMBAR 04/16/2007   COLONIC POLYPS, HX OF 04/16/2007   Dyslipidemia 01/27/2007   Essential hypertension 01/27/2007   ALLERGIC RHINITIS 01/27/2007    Past Surgical History:  Procedure Laterality Date   benign breast biopsy     COLONOSCOPY  06 13 11   Dr Earlean Shawl,  benign polyp, diverticulosis, no repeats recommended   ganglion cyst removed     left thumba   LUMBAR FUSION     L3-4 and L4-5 per Dr. Carloyn Manner 2002   TONSILLECTOMY     VAGINAL HYSTERECTOMY     with ovaries intact     OB History   No obstetric history on file.      Home Medications    Prior to Admission medications   Medication Sig Start Date End Date Taking? Authorizing Provider  acetaminophen (TYLENOL) 325 MG tablet Take 325 mg by mouth See admin instructions. Take 325 mg by mouth three times a day and an additional 650 mg up to three times a day as needed for back pain    [provider]  cholecalciferol (VITAMIN D3) 25 MCG (1000 UT) tablet Take 1,000 Units by mouth daily.    [provider]  DULoxetine (CYMBALTA) 30 MG capsule Take 30-60 mg by mouth See admin instructions. Take 30 mg  by mouth once a day for 2 weeks, then increase to 2 capsules (60 mg) once a day 12/04/18   [provider]  ferrous sulfate 325 (65 FE) MG tablet Take 325 mg by mouth daily with breakfast.    [provider]  metoprolol succinate (TOPROL-XL) 25 MG 24 hr tablet Take 0.5 tablets (12.5 mg total) by mouth daily. Take with or immediately following a meal. 12/12/18   Mariel Aloe, MD  NON FORMULARY Take 120 mLs by mouth See admin instructions. MedPass: Drink 120 ml's by mouth 2 times a day    [provider]  potassium chloride (KLOR-CON 10) 10 MEQ tablet Take 1 tablet (10 mEq total) by mouth daily. 05/15/18   Laurey Morale, MD  traMADol (ULTRAM) 50 MG tablet Take 1 tablet (50 mg total) by mouth every 6 (six) hours as  needed (for pain). 12/12/18   Mariel Aloe, MD    Family History Family History  Problem Relation Age of Onset   Arthritis Other    Diabetes Other    Hypertension Other    Lung cancer Other    Heart disease Other     Social History Social History   Tobacco Use   Smoking status: Never Smoker   Smokeless tobacco: Never Used  Substance Use Topics   Alcohol use: No    Alcohol/week: 0.0 standard drinks    Comment: once every 2-3 months   Drug use: No     Allergies   Penicillins, Pneumococcal vaccine polyvalent, and Tetanus-diphtheria toxoids td   Review of Systems Review of Systems  Unable to perform ROS: Dementia  Constitutional: Negative for fever.  Skin: Positive for wound.  Neurological:       Head trauma     Physical Exam Updated Vital Signs BP (!) 168/86    Pulse 80    Temp 97.9 F (36.6 C) (Oral)    Resp 16    SpO2 98%   Physical Exam Vitals signs and nursing note reviewed.  Constitutional:      General: She is not in acute distress.    Appearance: She is well-developed.  HENT:     Head: Normocephalic.     Comments: Mobile soft cystic appearing lesion to the right parietal scalp (chronic per patient). 1 cm linear laceration to the posterior scalp without active bleeding.  Eyes:     Extraocular Movements: Extraocular movements intact.     Conjunctiva/sclera: Conjunctivae normal.     Pupils: Pupils are equal, round, and reactive to light.  Neck:     Musculoskeletal: Neck supple.  Cardiovascular:     Rate and Rhythm: Normal rate and regular rhythm.     Pulses: Normal pulses.     Heart sounds: Normal heart sounds. No murmur.  Pulmonary:     Effort: Pulmonary effort is normal. No respiratory distress.     Breath sounds: Normal breath sounds. No wheezing, rhonchi or rales.  Abdominal:     General: Bowel sounds are normal.     Palpations: Abdomen is soft.     Tenderness: There is no abdominal tenderness. There is no guarding or rebound.    Musculoskeletal:     Comments: No TTP to the cervical or lumbar spine. Mild TTP to the upper thoracic spine. No TTP to the BUE/BLE or bilat chest wall.   Skin:    General: Skin is warm and dry.  Neurological:     Mental Status: She is alert.     Comments:  Mental Status:  Alert, clear speech, no facial droop, moving all extremities with normal coordination.       ED Treatments / Results  Labs (all labs ordered are listed, but only abnormal results are displayed) Labs Reviewed  CBC WITH DIFFERENTIAL/PLATELET - Abnormal; Notable for the following components:      Result Value   RDW 16.0 (*)    All other components within normal limits  COMPREHENSIVE METABOLIC PANEL - Abnormal; Notable for the following components:   Sodium 132 (*)    Chloride 97 (*)    Glucose, Bld 108 (*)    Albumin 3.4 (*)    GFR calc non Af Amer 60 (*)    All other components within normal limits  URINE CULTURE  URINALYSIS, ROUTINE W REFLEX MICROSCOPIC    EKG None  Radiology Dg Thoracic Spine 2 View  Result Date: 02/23/2019 CLINICAL DATA:  Acute mid back pain following fall. Initial encounter. EXAM: THORACIC SPINE 2 VIEWS COMPARISON:  01/01/2011 chest radiograph FINDINGS: No acute fracture or subluxation. No focal bony lesions are present. Degenerative disc disease/spondylosis in the cervical spine identified. IMPRESSION: No acute abnormality. Electronically Signed   By: Margarette Canada M.D.   On: 02/23/2019 17:53   Ct Head Wo Contrast  Result Date: 02/23/2019 CLINICAL DATA:  Unwitnessed fall at nursing home, laceration to back of head, history of hypertension2 EXAM: CT HEAD WITHOUT CONTRAST CT CERVICAL SPINE WITHOUT CONTRAST TECHNIQUE: Multidetector CT imaging of the head and cervical spine was performed following the standard protocol without intravenous contrast. Multiplanar CT image reconstructions of the cervical spine were also generated. COMPARISON:  12/08/2018 FINDINGS: CT HEAD FINDINGS Brain:  Generalized atrophy. Normal ventricular morphology. No midline shift or mass effect. Small vessel chronic ischemic changes of deep cerebral white matter. No intracranial hemorrhage, mass lesion, evidence of acute infarction, or extra-axial fluid collection. Vascular: No hyperdense vessels Skull: Mild hyperostosis frontalis interna. Small exostosis at the RIGHT frontal bone. Calvaria intact Sinuses/Orbits: Clear Other: N/A CT CERVICAL SPINE FINDINGS Alignment: Minimal retrolisthesis C4-C5 unchanged. Remaining alignments normal. Skull base and vertebrae: Osseous demineralization. Multilevel disc space narrowing and endplate spur formation. Multilevel facet degenerative changes. Visualized skull base intact. No acute fracture, acute subluxation, or bone destruction. Soft tissues and spinal canal: Prevertebral soft tissues normal thickness. Disc levels:  No additional abnormalities Upper chest: Lung apices clear Other: N/A IMPRESSION: Atrophy with small vessel chronic ischemic changes of deep cerebral white matter. No acute intracranial abnormalities. Multilevel degenerative disc and facet disease changes cervical spine. No acute cervical spine abnormalities. Electronically Signed   By: Lavonia Dana M.D.   On: 02/23/2019 17:14   Ct Cervical Spine Wo Contrast  Result Date: 02/23/2019 CLINICAL DATA:  Unwitnessed fall at nursing home, laceration to back of head, history of hypertension2 EXAM: CT HEAD WITHOUT CONTRAST CT CERVICAL SPINE WITHOUT CONTRAST TECHNIQUE: Multidetector CT imaging of the head and cervical spine was performed following the standard protocol without intravenous contrast. Multiplanar CT image reconstructions of the cervical spine were also generated. COMPARISON:  12/08/2018 FINDINGS: CT HEAD FINDINGS Brain: Generalized atrophy. Normal ventricular morphology. No midline shift or mass effect. Small vessel chronic ischemic changes of deep cerebral white matter. No intracranial hemorrhage, mass lesion,  evidence of acute infarction, or extra-axial fluid collection. Vascular: No hyperdense vessels Skull: Mild hyperostosis frontalis interna. Small exostosis at the RIGHT frontal bone. Calvaria intact Sinuses/Orbits: Clear Other: N/A CT CERVICAL SPINE FINDINGS Alignment: Minimal retrolisthesis C4-C5 unchanged. Remaining alignments normal. Skull base and vertebrae: Osseous  demineralization. Multilevel disc space narrowing and endplate spur formation. Multilevel facet degenerative changes. Visualized skull base intact. No acute fracture, acute subluxation, or bone destruction. Soft tissues and spinal canal: Prevertebral soft tissues normal thickness. Disc levels:  No additional abnormalities Upper chest: Lung apices clear Other: N/A IMPRESSION: Atrophy with small vessel chronic ischemic changes of deep cerebral white matter. No acute intracranial abnormalities. Multilevel degenerative disc and facet disease changes cervical spine. No acute cervical spine abnormalities. Electronically Signed   By: Lavonia Dana M.D.   On: 02/23/2019 17:14    Procedures .Marland KitchenLaceration Repair  Date/Time: 02/23/2019 7:58 PM Performed by: Rodney Booze, PA-C Authorized by: Rodney Booze, PA-C   Consent:    Consent obtained:  Verbal   Consent given by:  Patient   Risks discussed:  Infection, need for additional repair and poor cosmetic result Anesthesia (see MAR for exact dosages):    Anesthesia method:  None Laceration details:    Location:  Scalp   Scalp location:  Occipital   Length (cm):  1 Repair type:    Repair type:  Simple Pre-procedure details:    Preparation:  Patient was prepped and draped in usual sterile fashion and imaging obtained to evaluate for foreign bodies Exploration:    Hemostasis achieved with:  Direct pressure   Wound exploration: wound explored through full range of motion and entire depth of wound probed and visualized     Contaminated: no   Treatment:    Area cleansed with:  Betadine  and saline   Amount of cleaning:  Standard   Irrigation solution:  Sterile saline   Irrigation method:  Pressure wash   Visualized foreign bodies/material removed: no   Skin repair:    Repair method:  Tissue adhesive Approximation:    Approximation:  Close Post-procedure details:    Dressing:  Open (no dressing)   Patient tolerance of procedure:  Tolerated well, no immediate complications   (including critical care time)  Medications Ordered in ED Medications - No data to display   Initial Impression / Assessment and Plan / ED Course  I have reviewed the triage vital signs and the nursing notes.  Pertinent labs & imaging results that were available during my care of the patient were reviewed by me and considered in my medical decision making (see chart for details).     Final Clinical Impressions(s) / ED Diagnoses   Final diagnoses:  Fall, initial encounter  Injury of head, initial encounter  Laceration of scalp, initial encounter   85 year old female presenting for evaluation of unwitnessed fall at her facility prior to arrival.  Has history of dementia therefore unable to provide significant history though denies any complaints during history.  She did sustain head trauma with a lack.  Unclear LOC.  No significant cervical spine tenderness.  Mild thoracic spine tenderness.  No lumbar tenderness or tenderness elsewhere.  CBC non acute CMP at baseline for pt UA negative for UTI  Xray thoracic spine  CT head with no acute intracranial abnormalities. CT cervical spine no acute cervical spine abnormalities.  Laceration was repaired with dermabond. Pt is allergic to Tdap vaccine therefore this was not updated.   Pt workup is reassuring. She is stable for discharge back to her facility.   ED Discharge Orders    None       Bishop Dublin 02/23/19 2159    Dorie Rank, MD 02/25/19 1125

## 2019-02-23 NOTE — ED Notes (Signed)
Called [facility] 

## 2019-02-23 NOTE — ED Notes (Signed)
Pt ambulatory to the restroom with one person assist. Pt was unsteady on her feet, but did not complain of any pain, lightheadedness, or dizziness.

## 2019-02-23 NOTE — Discharge Instructions (Addendum)
Please follow up with your primary care provider within 5-7 days for re-evaluation of your symptoms. If you do not have a primary care provider, information for a healthcare clinic has been provided for you to make arrangements for follow up care. Please return to the emergency department for any new or worsening symptoms. ° °

## 2019-02-23 NOTE — ED Triage Notes (Signed)
EMS reports from [facility], unwitnessed fall, laceration to back of head. Pt denies pain. No other obvious injuries. Unknown LOC. No blood thinners. Hx of dementia and at baseline per facility. Pt does not remember falling.  BP 168/86 HR 80 RR 14 Sp02 98 RA

## 2019-02-23 NOTE — ED Notes (Signed)
[name] (daughter) can be contacted at [phone].

## 2019-02-24 LAB — URINE CULTURE

## 2019-03-04 DIAGNOSIS — Z20828 Contact with and (suspected) exposure to other viral communicable diseases: Secondary | ICD-10-CM | POA: Diagnosis not present

## 2019-03-10 DIAGNOSIS — R278 Other lack of coordination: Secondary | ICD-10-CM | POA: Diagnosis not present

## 2019-03-10 DIAGNOSIS — R296 Repeated falls: Secondary | ICD-10-CM | POA: Diagnosis not present

## 2019-03-10 DIAGNOSIS — M6281 Muscle weakness (generalized): Secondary | ICD-10-CM | POA: Diagnosis not present

## 2019-03-10 DIAGNOSIS — I82432 Acute embolism and thrombosis of left popliteal vein: Secondary | ICD-10-CM | POA: Diagnosis not present

## 2019-03-10 DIAGNOSIS — F0390 Unspecified dementia without behavioral disturbance: Secondary | ICD-10-CM | POA: Diagnosis not present

## 2019-03-10 DIAGNOSIS — M545 Low back pain: Secondary | ICD-10-CM | POA: Diagnosis not present

## 2019-03-10 DIAGNOSIS — R2681 Unsteadiness on feet: Secondary | ICD-10-CM | POA: Diagnosis not present

## 2019-03-10 DIAGNOSIS — N179 Acute kidney failure, unspecified: Secondary | ICD-10-CM | POA: Diagnosis not present

## 2019-03-23 DIAGNOSIS — R278 Other lack of coordination: Secondary | ICD-10-CM | POA: Diagnosis not present

## 2019-03-23 DIAGNOSIS — R2681 Unsteadiness on feet: Secondary | ICD-10-CM | POA: Diagnosis not present

## 2019-03-23 DIAGNOSIS — M6281 Muscle weakness (generalized): Secondary | ICD-10-CM | POA: Diagnosis not present

## 2019-03-23 DIAGNOSIS — I951 Orthostatic hypotension: Secondary | ICD-10-CM | POA: Diagnosis not present

## 2019-03-23 DIAGNOSIS — R296 Repeated falls: Secondary | ICD-10-CM | POA: Diagnosis not present

## 2019-03-26 DIAGNOSIS — H04123 Dry eye syndrome of bilateral lacrimal glands: Secondary | ICD-10-CM | POA: Diagnosis not present

## 2019-03-26 DIAGNOSIS — I10 Essential (primary) hypertension: Secondary | ICD-10-CM | POA: Diagnosis not present

## 2019-03-26 DIAGNOSIS — Z961 Presence of intraocular lens: Secondary | ICD-10-CM | POA: Diagnosis not present

## 2019-04-01 DIAGNOSIS — F419 Anxiety disorder, unspecified: Secondary | ICD-10-CM | POA: Diagnosis not present

## 2019-04-01 DIAGNOSIS — G4700 Insomnia, unspecified: Secondary | ICD-10-CM | POA: Diagnosis not present

## 2019-04-01 DIAGNOSIS — F0390 Unspecified dementia without behavioral disturbance: Secondary | ICD-10-CM | POA: Diagnosis not present

## 2019-04-01 DIAGNOSIS — F339 Major depressive disorder, recurrent, unspecified: Secondary | ICD-10-CM | POA: Diagnosis not present

## 2019-05-26 ENCOUNTER — Non-Acute Institutional Stay: Payer: Medicare Other | Admitting: Hospice

## 2019-05-26 ENCOUNTER — Other Ambulatory Visit: Payer: Self-pay

## 2019-05-26 DIAGNOSIS — F0281 Dementia in other diseases classified elsewhere with behavioral disturbance: Secondary | ICD-10-CM

## 2019-05-26 DIAGNOSIS — Z515 Encounter for palliative care: Secondary | ICD-10-CM | POA: Diagnosis not present

## 2019-05-26 NOTE — Progress Notes (Addendum)
[facility] Pall[facility] Consult Note Telephone: [phone]  Fax: [phone]  PATIENT NAME: Shirley Wright DOB: 1925/06/25 MRN: [id number]  PRIMARY CARE PROVIDER:   Dr. Josetta Huddle REFERRING PROVIDER:  Laurey Morale, MD [place],  [place] [address]  RESPONSIBLE PARTY:   [name], daughter [phone]   RECOMMENDATIONS/PLAN:   Advance Care Planning/Goals of Care: Visit consisted of building trust and follow up on palliative care. Patient is a DNR. Goals of care include to maximize quality of life and symptom management. Report on chart indicatesDaughter  - [name] is on board with hospice when that time comes. DNR form uploaded in [place] today. Symptom management: Ongoing memory loss r/t Dementia at baseline. FAST 6c with occasional incontinence of bladder; she is continent of bowel. Requires one person assist with ADLs, able to feed herself. Ambulates with her rolling walker. No recent fall.  Continue supportive care. Follow up: Palliative care will continue to follow patient for goals of care clarification and symptom management. I spent 30 minutes providing this consultation, from 11.15am to 11.45am.  More than 50% of the time in this consultation was spent on coordinating communication  HISTORY OF PRESENT ILLNESS:  BLIMI WINTER is a 86 y.o. year old female with multiple medical problems including dementia, HTN, depression, OA. Palliative Care was asked to help address goals of care.  CODE STATUS: DNR  PPS: 40% HOSPICE ELIGIBILITY/DIAGNOSIS: TBD  PAST MEDICAL HISTORY:  Past Medical History:  Diagnosis Date  . Adenomatous colon polyp    tubular  . Allergic rhinitis   . Diverticulosis   . GERD (gastroesophageal reflux disease)   . Hemorrhoids   . Hepatic cyst   . History of colonic polyps   . Hyperlipidemia   . Hypertension   . IBS (irritable bowel syndrome)   . Osteoarthritis     SOCIAL HX:  Social  History   Tobacco Use  . Smoking status: Never Smoker  . Smokeless tobacco: Never Used  Substance Use Topics  . Alcohol use: No    Alcohol/week: 0.0 standard drinks    Comment: once every 2-3 months    ALLERGIES:  Allergies  Allergen Reactions  . Penicillins Other (See Comments)    "Allergic," per [facility] Did it involve swelling of the face/tongue/throat, SOB, or low BP? Unk Did it involve sudden or severe rash/hives, skin peeling, or any reaction on the inside of your mouth or nose? Unk Did you need to seek medical attention at a hospital or doctor's office? Unk When did it last happen? Unk If all above answers are "NO", may proceed with cephalosporin use.   . Pneumococcal Vaccine Polyvalent Other (See Comments)    "Allergic," per [facility]  . Tetanus-Diphtheria Toxoids Td Other (See Comments)    "Allergic," per [facility]     PERTINENT MEDICATIONS:  Outpatient Encounter Medications as of 05/26/2019  Medication Sig  . acetaminophen (TYLENOL) 325 MG tablet Take 325 mg by mouth See admin instructions. Take 325 mg by mouth three times a day and an additional 650 mg up to three times a day as needed for back pain  . cholecalciferol (VITAMIN D3) 25 MCG (1000 UT) tablet Take 1,000 Units by mouth daily.  . DULoxetine (CYMBALTA) 30 MG capsule Take 30-60 mg by mouth See admin instructions. Take 30 mg by mouth once a day for 2 weeks, then increase to 2 capsules (60 mg) once a day  . ferrous sulfate 325 (65 FE) MG tablet  Take 325 mg by mouth daily with breakfast.  . metoprolol succinate (TOPROL-XL) 25 MG 24 hr tablet Take 0.5 tablets (12.5 mg total) by mouth daily. Take with or immediately following a meal.  . NON FORMULARY Take 120 mLs by mouth See admin instructions. MedPass: Drink 120 ml's by mouth 2 times a day  . potassium chloride (KLOR-CON 10) 10 MEQ tablet Take 1 tablet (10 mEq total) by mouth daily.  . traMADol (ULTRAM) 50 MG tablet Take 1 tablet (50 mg total) by mouth every 6 (six) hours as needed  (for pain).   No facility-administered encounter medications on file as of 05/26/2019.    PHYSICAL EXAM:   General: resting in bed, cooperative, in no acute distress Cardiovascular: regular rate and rhythm Pulmonary: clear ant fields Abdomen: soft, nontender, + bowel sounds GU: no suprapubic tenderness Extremities: no edema, no joint deformities Skin: no rashes on exposed skin Neurological: Weakness but otherwise nonfocal, alert and oriented to person only  Teodoro Spray, NP

## 2019-06-25 DIAGNOSIS — L602 Onychogryphosis: Secondary | ICD-10-CM | POA: Diagnosis not present

## 2019-06-25 DIAGNOSIS — I739 Peripheral vascular disease, unspecified: Secondary | ICD-10-CM | POA: Diagnosis not present

## 2019-06-25 DIAGNOSIS — L03031 Cellulitis of right toe: Secondary | ICD-10-CM | POA: Diagnosis not present

## 2019-07-20 DIAGNOSIS — D649 Anemia, unspecified: Secondary | ICD-10-CM | POA: Diagnosis not present

## 2019-07-20 DIAGNOSIS — I10 Essential (primary) hypertension: Secondary | ICD-10-CM | POA: Diagnosis not present

## 2019-07-20 DIAGNOSIS — D509 Iron deficiency anemia, unspecified: Secondary | ICD-10-CM | POA: Diagnosis not present

## 2019-07-20 DIAGNOSIS — Z03818 Encounter for observation for suspected exposure to other biological agents ruled out: Secondary | ICD-10-CM | POA: Diagnosis not present

## 2019-07-20 DIAGNOSIS — E559 Vitamin D deficiency, unspecified: Secondary | ICD-10-CM | POA: Diagnosis not present

## 2019-08-13 DIAGNOSIS — R278 Other lack of coordination: Secondary | ICD-10-CM | POA: Diagnosis not present

## 2019-08-13 DIAGNOSIS — I951 Orthostatic hypotension: Secondary | ICD-10-CM | POA: Diagnosis not present

## 2019-08-13 DIAGNOSIS — M6281 Muscle weakness (generalized): Secondary | ICD-10-CM | POA: Diagnosis not present

## 2019-08-13 DIAGNOSIS — R2681 Unsteadiness on feet: Secondary | ICD-10-CM | POA: Diagnosis not present

## 2019-08-14 DIAGNOSIS — M6281 Muscle weakness (generalized): Secondary | ICD-10-CM | POA: Diagnosis not present

## 2019-08-14 DIAGNOSIS — I951 Orthostatic hypotension: Secondary | ICD-10-CM | POA: Diagnosis not present

## 2019-08-14 DIAGNOSIS — R2681 Unsteadiness on feet: Secondary | ICD-10-CM | POA: Diagnosis not present

## 2019-08-14 DIAGNOSIS — R278 Other lack of coordination: Secondary | ICD-10-CM | POA: Diagnosis not present

## 2019-08-17 DIAGNOSIS — R278 Other lack of coordination: Secondary | ICD-10-CM | POA: Diagnosis not present

## 2019-08-17 DIAGNOSIS — R2681 Unsteadiness on feet: Secondary | ICD-10-CM | POA: Diagnosis not present

## 2019-08-17 DIAGNOSIS — M6281 Muscle weakness (generalized): Secondary | ICD-10-CM | POA: Diagnosis not present

## 2019-08-17 DIAGNOSIS — I951 Orthostatic hypotension: Secondary | ICD-10-CM | POA: Diagnosis not present

## 2019-08-19 DIAGNOSIS — R2681 Unsteadiness on feet: Secondary | ICD-10-CM | POA: Diagnosis not present

## 2019-08-19 DIAGNOSIS — R278 Other lack of coordination: Secondary | ICD-10-CM | POA: Diagnosis not present

## 2019-08-19 DIAGNOSIS — I951 Orthostatic hypotension: Secondary | ICD-10-CM | POA: Diagnosis not present

## 2019-08-19 DIAGNOSIS — M6281 Muscle weakness (generalized): Secondary | ICD-10-CM | POA: Diagnosis not present

## 2019-08-21 DIAGNOSIS — M6281 Muscle weakness (generalized): Secondary | ICD-10-CM | POA: Diagnosis not present

## 2019-08-21 DIAGNOSIS — R278 Other lack of coordination: Secondary | ICD-10-CM | POA: Diagnosis not present

## 2019-08-21 DIAGNOSIS — R293 Abnormal posture: Secondary | ICD-10-CM | POA: Diagnosis not present

## 2019-08-21 DIAGNOSIS — G309 Alzheimer's disease, unspecified: Secondary | ICD-10-CM | POA: Diagnosis not present

## 2019-08-21 DIAGNOSIS — R296 Repeated falls: Secondary | ICD-10-CM | POA: Diagnosis not present

## 2019-08-25 DIAGNOSIS — M6281 Muscle weakness (generalized): Secondary | ICD-10-CM | POA: Diagnosis not present

## 2019-08-25 DIAGNOSIS — R296 Repeated falls: Secondary | ICD-10-CM | POA: Diagnosis not present

## 2019-08-25 DIAGNOSIS — R293 Abnormal posture: Secondary | ICD-10-CM | POA: Diagnosis not present

## 2019-08-25 DIAGNOSIS — G309 Alzheimer's disease, unspecified: Secondary | ICD-10-CM | POA: Diagnosis not present

## 2019-08-25 DIAGNOSIS — R278 Other lack of coordination: Secondary | ICD-10-CM | POA: Diagnosis not present

## 2019-08-26 DIAGNOSIS — R278 Other lack of coordination: Secondary | ICD-10-CM | POA: Diagnosis not present

## 2019-08-26 DIAGNOSIS — M6281 Muscle weakness (generalized): Secondary | ICD-10-CM | POA: Diagnosis not present

## 2019-08-26 DIAGNOSIS — G309 Alzheimer's disease, unspecified: Secondary | ICD-10-CM | POA: Diagnosis not present

## 2019-08-26 DIAGNOSIS — R293 Abnormal posture: Secondary | ICD-10-CM | POA: Diagnosis not present

## 2019-08-26 DIAGNOSIS — R296 Repeated falls: Secondary | ICD-10-CM | POA: Diagnosis not present

## 2019-08-27 DIAGNOSIS — R278 Other lack of coordination: Secondary | ICD-10-CM | POA: Diagnosis not present

## 2019-08-27 DIAGNOSIS — R293 Abnormal posture: Secondary | ICD-10-CM | POA: Diagnosis not present

## 2019-08-27 DIAGNOSIS — R296 Repeated falls: Secondary | ICD-10-CM | POA: Diagnosis not present

## 2019-08-27 DIAGNOSIS — M6281 Muscle weakness (generalized): Secondary | ICD-10-CM | POA: Diagnosis not present

## 2019-08-27 DIAGNOSIS — G309 Alzheimer's disease, unspecified: Secondary | ICD-10-CM | POA: Diagnosis not present

## 2019-08-28 DIAGNOSIS — M6281 Muscle weakness (generalized): Secondary | ICD-10-CM | POA: Diagnosis not present

## 2019-08-28 DIAGNOSIS — G309 Alzheimer's disease, unspecified: Secondary | ICD-10-CM | POA: Diagnosis not present

## 2019-08-28 DIAGNOSIS — R278 Other lack of coordination: Secondary | ICD-10-CM | POA: Diagnosis not present

## 2019-08-28 DIAGNOSIS — R296 Repeated falls: Secondary | ICD-10-CM | POA: Diagnosis not present

## 2019-08-28 DIAGNOSIS — R293 Abnormal posture: Secondary | ICD-10-CM | POA: Diagnosis not present

## 2019-09-08 DIAGNOSIS — R296 Repeated falls: Secondary | ICD-10-CM | POA: Diagnosis not present

## 2019-09-08 DIAGNOSIS — R278 Other lack of coordination: Secondary | ICD-10-CM | POA: Diagnosis not present

## 2019-09-08 DIAGNOSIS — M6281 Muscle weakness (generalized): Secondary | ICD-10-CM | POA: Diagnosis not present

## 2019-09-08 DIAGNOSIS — L602 Onychogryphosis: Secondary | ICD-10-CM | POA: Diagnosis not present

## 2019-09-08 DIAGNOSIS — R293 Abnormal posture: Secondary | ICD-10-CM | POA: Diagnosis not present

## 2019-09-08 DIAGNOSIS — G309 Alzheimer's disease, unspecified: Secondary | ICD-10-CM | POA: Diagnosis not present

## 2019-09-08 DIAGNOSIS — I739 Peripheral vascular disease, unspecified: Secondary | ICD-10-CM | POA: Diagnosis not present

## 2019-09-11 ENCOUNTER — Other Ambulatory Visit: Payer: Self-pay

## 2019-09-11 ENCOUNTER — Non-Acute Institutional Stay: Payer: Medicare Other | Admitting: Hospice

## 2019-09-11 DIAGNOSIS — Z515 Encounter for palliative care: Secondary | ICD-10-CM | POA: Diagnosis not present

## 2019-09-11 DIAGNOSIS — F0281 Dementia in other diseases classified elsewhere with behavioral disturbance: Secondary | ICD-10-CM

## 2019-09-11 NOTE — Progress Notes (Addendum)
[facility] Pall[facility] Consult Note Telephone: [phone]  Fax: [phone]  PATIENT NAME: Shirley Wright DOB: 1925/06/13 MRN: [id number]  PRIMARY CARE PROVIDER:   Dr. Josetta Huddle REFERRING PROVIDER:  Laurey Morale, MD [place],  [place] [address]  RESPONSIBLE PARTY:   [name], daughter [phone]   RECOMMENDATIONS/PLAN:   Advance Care Planning/Goals of Care: Visit consisted of building trust and follow up on palliative care. Patient is a DNR. Goals of care include to maximize quality of life and symptom management. Report on chart indicatesDaughter  - [name] is on board with hospice when that time comes. DNR form uploaded in [place] today. Symptom management: Ongoing memory loss and decline in functional status r/t Dementia. Patient in now FAST 6d - a decline from FAST 6c last visit; incontinent of bowel and bladder.She continues on Trazodone for insomnia, Cymbalta for depression and Namenda for Dementia. She is currently followed by Psych. She denied moodiness, was having her hair groomed in her room during visit, pleasant, cooperative. Appetite is varied , no weight loss since last visit. She denied pain/discomfort. Requires one person assist with ADLs, able to feed herself.Ambulates with her rolling walker. No recent fall. Nursing with no concerns. Continue supportive care. Follow up: Palliative care will continue to follow patient for goals of care clarification and symptom management. I spent 55 minutes providing this consultation; time includes chart review and documentation.  More than 50% of the time in this consultation was spent on coordinating communication  HISTORY OF PRESENT ILLNESS:Shirley Wright a 86 y.o.year oldfemalewith multiple medical problems including dementia, HTN, depression, OA. Palliative Care was asked to help address goals of care. CODE STATUS: DNR  PPS: 50% HOSPICE  ELIGIBILITY/DIAGNOSIS: TBD  PAST MEDICAL HISTORY:  Past Medical History:  Diagnosis Date  . Adenomatous colon polyp    tubular  . Allergic rhinitis   . Diverticulosis   . GERD (gastroesophageal reflux disease)   . Hemorrhoids   . Hepatic cyst   . History of colonic polyps   . Hyperlipidemia   . Hypertension   . IBS (irritable bowel syndrome)   . Osteoarthritis     SOCIAL HX:  Social History   Tobacco Use  . Smoking status: Never Smoker  . Smokeless tobacco: Never Used  Substance Use Topics  . Alcohol use: No    Alcohol/week: 0.0 standard drinks    Comment: once every 2-3 months    ALLERGIES:  Allergies  Allergen Reactions  . Penicillins Other (See Comments)    "Allergic," per [facility] Did it involve swelling of the face/tongue/throat, SOB, or low BP? Unk Did it involve sudden or severe rash/hives, skin peeling, or any reaction on the inside of your mouth or nose? Unk Did you need to seek medical attention at a hospital or doctor's office? Unk When did it last happen? Unk If all above answers are "NO", may proceed with cephalosporin use.   . Pneumococcal Vaccine Polyvalent Other (See Comments)    "Allergic," per [facility]  . Tetanus-Diphtheria Toxoids Td Other (See Comments)    "Allergic," per [facility]     PERTINENT MEDICATIONS:  Outpatient Encounter Medications as of 09/11/2019  Medication Sig  . acetaminophen (TYLENOL) 325 MG tablet Take 325 mg by mouth See admin instructions. Take 325 mg by mouth three times a day and an additional 650 mg up to three times a day as needed for back pain  . cholecalciferol (VITAMIN D3) 25 MCG (  1000 UT) tablet Take 1,000 Units by mouth daily.  . DULoxetine (CYMBALTA) 30 MG capsule Take 30-60 mg by mouth See admin instructions. Take 30 mg by mouth once a day for 2 weeks, then increase to 2 capsules (60 mg) once a day  . ferrous sulfate 325 (65 FE) MG tablet Take 325 mg by mouth daily with breakfast.  . metoprolol succinate (TOPROL-XL) 25 MG 24 hr  tablet Take 0.5 tablets (12.5 mg total) by mouth daily. Take with or immediately following a meal.  . NON FORMULARY Take 120 mLs by mouth See admin instructions. MedPass: Drink 120 ml's by mouth 2 times a day  . potassium chloride (KLOR-CON 10) 10 MEQ tablet Take 1 tablet (10 mEq total) by mouth daily.  . traMADol (ULTRAM) 50 MG tablet Take 1 tablet (50 mg total) by mouth every 6 (six) hours as needed (for pain).   No facility-administered encounter medications on file as of 09/11/2019.    PHYSICAL EXAM/ROS:   General: NAD, frail appearing, cooperative Cardiovascular: regular rate and rhythm, denies chest pain Pulmonary: clear ant fields, normal respiratory effort Abdomen: soft, nontender, + bowel sounds GU: no suprapubic tenderness Extremities: non pitting mild edema to BLE, compression hose in use, no joint deformities Skin: no rashes to exposed skin Neurological: Weakness but otherwise nonfocal  Teodoro Spray, NP

## 2019-09-15 ENCOUNTER — Telehealth: Payer: Self-pay | Admitting: Hospice

## 2019-09-15 NOTE — Telephone Encounter (Signed)
Hilda Blades called in follow up on visit note of 07/14/2019. She needed clarification on patient's decline from FAST 6c to 6d. NP explained that it is a decline in functional status in line with Dementia disease trajectory, 6d showing patient is now incontinent of both bladder and bowel. Hilda Blades also noted that patient does not have a Psychologist, forensic; assured this will be removed accordingly. NP also reached out Hanley Ben NP who earlier saw patient 01/05/2019 to expunge same. Hilda Blades expressed grief over patient's ongoing cognitive decline and wondering how she can best relate with her. Ample emotional support provided; tips on music, coloring, validation, redirecting discussed. She expressed appreciation. She affirmed when patient qualifies for Hospice, she would want hospice services for her.

## 2019-10-30 ENCOUNTER — Non-Acute Institutional Stay: Payer: Medicare Other | Admitting: Hospice

## 2019-10-30 ENCOUNTER — Other Ambulatory Visit: Payer: Self-pay

## 2019-10-30 DIAGNOSIS — Z515 Encounter for palliative care: Secondary | ICD-10-CM | POA: Diagnosis not present

## 2019-10-30 DIAGNOSIS — F0281 Dementia in other diseases classified elsewhere with behavioral disturbance: Secondary | ICD-10-CM

## 2019-10-30 DIAGNOSIS — G309 Alzheimer's disease, unspecified: Secondary | ICD-10-CM

## 2019-10-30 NOTE — Progress Notes (Signed)
[facility] Consult Note Telephone: [phone]  PATIENT NAME: Shirley Wright DOB: 1925-09-13 MRN: [id number]  PRIMARY CARE PROVIDER:Dr. Josetta Huddle REFERRING PROVIDER:Fry, Ishmael Holter, MD [place], [place] [address]  [place], daughter [phone]   RECOMMENDATIONS/PLAN:  Advance Care Planning/Goals of Care: Visit consisted of building trust andfollow up on palliative care. Patient remains a DO NOT RESUSCITATE.Goals of care include to maximize quality of life and symptom management.Report on chart indicatesDaughter- Debbie ison board with hospice when that time comes. Telephone discussion with daughter on the complex and emotionally intense issues of symptom management and palliative care in the setting of serious and potentially life-threatening illness.  Also discussed with her, the progression and landmarks of dementia disease.  She does have her teary moments but she is resolved to continue to provide patient with family love, engaging experiences, resources and opportunities to enhance the quality of her life.  Validation and emotional support provided.  Palliative care team will continue to support patient, patient's family, and medical team. Symptom management: Patient resting in bed, content, in no acute distress; denied pain/discomfort.  FLACC 0. Ongoing memory loss and decline in functional status r/t Dementia.  6D, incontinent of bowel and bladder. She continues on Trazodone for insomnia, Cymbalta for depression and Namenda for Dementia. She is currently followed by Psych.  Patient is cooperative and compliant with her medications.  She requires one person assist with ADLs,able to feed herself after set up.Ambulates with her rollingwalker. No report of recent fall or hospitalization.Nursing with no concerns. Continue ongoing supportive care. Follow JO:[id number] care will  continue to follow patient for goals of care clarification and symptom management. I spent48 minutes providing this consultation; time includes chart review and documentation. More than 50% of the time in this consultation was spent on coordinating communication  HISTORY OF PRESENT ILLNESS:Shirley B Aydeletteis a 86 y.o.year oldfemalewith multiple medical problems including dementia, HTN, depression, OA. Palliative Care was asked to help address goals of care. CODE STATUS: DNR  PPS: 50% HOSPICE ELIGIBILITY/DIAGNOSIS: TBD  PAST MEDICAL HISTORY:  Past Medical History:  Diagnosis Date  . Adenomatous colon polyp    tubular  . Allergic rhinitis   . Diverticulosis   . GERD (gastroesophageal reflux disease)   . Hemorrhoids   . Hepatic cyst   . History of colonic polyps   . Hyperlipidemia   . Hypertension   . IBS (irritable bowel syndrome)   . Osteoarthritis     SOCIAL HX:  Social History   Tobacco Use  . Smoking status: Never Smoker  . Smokeless tobacco: Never Used  Substance Use Topics  . Alcohol use: No    Alcohol/week: 0.0 standard drinks    Comment: once every 2-3 months    ALLERGIES:  Allergies  Allergen Reactions  . Penicillins Other (See Comments)    "Allergic," per [facility] Did it involve swelling of the face/tongue/throat, SOB, or low BP? Unk Did it involve sudden or severe rash/hives, skin peeling, or any reaction on the inside of your mouth or nose? Unk Did you need to seek medical attention at a hospital or doctor's office? Unk When did it last happen? Unk If all above answers are "NO", may proceed with cephalosporin use.   . Pneumococcal Vaccine Polyvalent Other (See Comments)    "Allergic," per [facility]  . Tetanus-Diphtheria Toxoids Td Other (See Comments)    "Allergic," per [facility]     PERTINENT MEDICATIONS:  Outpatient Encounter Medications as  of 10/30/2019  Medication Sig  . acetaminophen (TYLENOL) 325 MG tablet Take 325 mg by mouth See admin instructions.  Take 325 mg by mouth three times a day and an additional 650 mg up to three times a day as needed for back pain  . cholecalciferol (VITAMIN D3) 25 MCG (1000 UT) tablet Take 1,000 Units by mouth daily.  . DULoxetine (CYMBALTA) 30 MG capsule Take 30-60 mg by mouth See admin instructions. Take 30 mg by mouth once a day for 2 weeks, then increase to 2 capsules (60 mg) once a day  . ferrous sulfate 325 (65 FE) MG tablet Take 325 mg by mouth daily with breakfast.  . metoprolol succinate (TOPROL-XL) 25 MG 24 hr tablet Take 0.5 tablets (12.5 mg total) by mouth daily. Take with or immediately following a meal.  . NON FORMULARY Take 120 mLs by mouth See admin instructions. MedPass: Drink 120 ml's by mouth 2 times a day  . potassium chloride (KLOR-CON 10) 10 MEQ tablet Take 1 tablet (10 mEq total) by mouth daily.  . traMADol (ULTRAM) 50 MG tablet Take 1 tablet (50 mg total) by mouth every 6 (six) hours as needed (for pain).   No facility-administered encounter medications on file as of 10/30/2019.    PHYSICAL EXAM/ROS:  General: NAD, well groomed, cooperative Cardiovascular: regular rate and rhythm, denies chest pain Pulmonary: clear ant fields, normal respiratory effort Abdomen: soft, nontender, + bowel sounds GU: no suprapubic tenderness Extremities: non pitting mild edema to BLE, compression hose in use, no joint deformities Skin: no rashes to exposed skin Neurological: Weakness but otherwise nonfocal   Teodoro Spray, NP

## 2019-11-06 DIAGNOSIS — I10 Essential (primary) hypertension: Secondary | ICD-10-CM | POA: Diagnosis not present

## 2019-11-06 DIAGNOSIS — D649 Anemia, unspecified: Secondary | ICD-10-CM | POA: Diagnosis not present

## 2019-11-06 DIAGNOSIS — E559 Vitamin D deficiency, unspecified: Secondary | ICD-10-CM | POA: Diagnosis not present

## 2019-11-06 DIAGNOSIS — D509 Iron deficiency anemia, unspecified: Secondary | ICD-10-CM | POA: Diagnosis not present

## 2019-11-06 DIAGNOSIS — Z03818 Encounter for observation for suspected exposure to other biological agents ruled out: Secondary | ICD-10-CM | POA: Diagnosis not present

## 2019-11-24 DIAGNOSIS — L602 Onychogryphosis: Secondary | ICD-10-CM | POA: Diagnosis not present

## 2019-11-24 DIAGNOSIS — L853 Xerosis cutis: Secondary | ICD-10-CM | POA: Diagnosis not present

## 2019-11-24 DIAGNOSIS — I739 Peripheral vascular disease, unspecified: Secondary | ICD-10-CM | POA: Diagnosis not present

## 2019-12-08 ENCOUNTER — Other Ambulatory Visit: Payer: Self-pay

## 2019-12-08 ENCOUNTER — Non-Acute Institutional Stay: Payer: Medicare Other | Admitting: Hospice

## 2019-12-08 DIAGNOSIS — Z515 Encounter for palliative care: Secondary | ICD-10-CM | POA: Diagnosis not present

## 2019-12-08 DIAGNOSIS — G309 Alzheimer's disease, unspecified: Secondary | ICD-10-CM

## 2019-12-08 DIAGNOSIS — F0281 Dementia in other diseases classified elsewhere with behavioral disturbance: Secondary | ICD-10-CM

## 2019-12-08 NOTE — Progress Notes (Signed)
[facility] Consult Note Telephone: [phone]  Fax: [phone]  [place] PROVIDER:Dr. Josetta Huddle REFERRING PROVIDER:Fry, Ishmael Holter, MD [place], [place] [address]  [place], daughter [phone]   RECOMMENDATIONS/PLAN:  Advance Care Planning/Goals of Care: Follow-up visit.  Patient remains a DO NOT RESUSCITATE.Goals of care include to maximize quality of life and symptom management.Report on chart indicatesDaughter- Debbie ison board with hospice when that time comes. Called Debbie and left her a voicemail with callback number. Palliative care will continue to provide support to patient, family, and the medical team.    Symptom management: Patient resting in bed patient in no acute distress, content.  She denied pain/discomfort.  FLACC 0.  No significant decline since last visit, no hospitalization, weight fairly stable, current at 140 pounds per facility records. Ongoing memory lossand decline in functional statusr/t Dementia.  6D, incontinent of bowel and bladder. She continues on Trazodone for insomnia, Cymbalta for depression and Namenda for Dementia. She is currently followed by Psych.  Patient continues to be cooperative and compliant with her medications. Nursing with no concerns.Continue ongoing supportive care. Follow RA:[id number] care will continue to follow patient for goals of care clarification and symptom management. I spent46 minutes providing this consultation; time includes time with patient/nursing, chart review and documentation.More than 50% of the time in this consultation was spent on coordinating communication  HISTORY OF PRESENT ILLNESS:Shirley B Aydeletteis a 86 y.o.year oldfemalewith multiple medical problems including dementia, HTN, depression, OA. Palliative Care was asked to help address goals of care. CODE  STATUS:DNR  PPS:50% HOSPICE ELIGIBILITY/DIAGNOSIS: TBD  PAST MEDICAL HISTORY:  Past Medical History:  Diagnosis Date  . Adenomatous colon polyp    tubular  . Allergic rhinitis   . Diverticulosis   . GERD (gastroesophageal reflux disease)   . Hemorrhoids   . Hepatic cyst   . History of colonic polyps   . Hyperlipidemia   . Hypertension   . IBS (irritable bowel syndrome)   . Osteoarthritis     SOCIAL HX:  Social History   Tobacco Use  . Smoking status: Never Smoker  . Smokeless tobacco: Never Used  Substance Use Topics  . Alcohol use: No    Alcohol/week: 0.0 standard drinks    Comment: once every 2-3 months    ALLERGIES:  Allergies  Allergen Reactions  . Penicillins Other (See Comments)    "Allergic," per [facility] Did it involve swelling of the face/tongue/throat, SOB, or low BP? Unk Did it involve sudden or severe rash/hives, skin peeling, or any reaction on the inside of your mouth or nose? Unk Did you need to seek medical attention at a hospital or doctor's office? Unk When did it last happen? Unk If all above answers are "NO", may proceed with cephalosporin use.   . Pneumococcal Vaccine Polyvalent Other (See Comments)    "Allergic," per [facility]  . Tetanus-Diphtheria Toxoids Td Other (See Comments)    "Allergic," per [facility]     PERTINENT MEDICATIONS:  Outpatient Encounter Medications as of 12/08/2019  Medication Sig  . acetaminophen (TYLENOL) 325 MG tablet Take 325 mg by mouth See admin instructions. Take 325 mg by mouth three times a day and an additional 650 mg up to three times a day as needed for back pain  . cholecalciferol (VITAMIN D3) 25 MCG (1000 UT) tablet Take 1,000 Units by mouth daily.  . DULoxetine (CYMBALTA) 30 MG capsule Take 30-60 mg by mouth See admin instructions. Take  30 mg by mouth once a day for 2 weeks, then increase to 2 capsules (60 mg) once a day  . ferrous sulfate 325 (65 FE) MG tablet Take 325 mg by mouth daily with breakfast.  . metoprolol  succinate (TOPROL-XL) 25 MG 24 hr tablet Take 0.5 tablets (12.5 mg total) by mouth daily. Take with or immediately following a meal.  . NON FORMULARY Take 120 mLs by mouth See admin instructions. MedPass: Drink 120 ml's by mouth 2 times a day  . potassium chloride (KLOR-CON 10) 10 MEQ tablet Take 1 tablet (10 mEq total) by mouth daily.  . traMADol (ULTRAM) 50 MG tablet Take 1 tablet (50 mg total) by mouth every 6 (six) hours as needed (for pain).   No facility-administered encounter medications on file as of 12/08/2019.    PHYSICAL EXAM/ROS:  General: NAD, well groomed,resting in bed, cooperative Cardiovascular: regular rate and rhythm, denies chest pain Pulmonary: clear ant fields, normal respiratory effort Abdomen: soft, nontender, + bowel sounds GU: no suprapubic tenderness Extremities: non pitting mild edema to BLE,compression hose in use,no joint deformities Skin: no rashesto exposed skin Neurological: Weakness but otherwise nonfocal; memory loss/confusion  Teodoro Spray, NP

## 2019-12-24 DIAGNOSIS — R293 Abnormal posture: Secondary | ICD-10-CM | POA: Diagnosis not present

## 2019-12-28 DIAGNOSIS — R293 Abnormal posture: Secondary | ICD-10-CM | POA: Diagnosis not present

## 2019-12-31 DIAGNOSIS — R293 Abnormal posture: Secondary | ICD-10-CM | POA: Diagnosis not present

## 2020-02-12 DIAGNOSIS — I10 Essential (primary) hypertension: Secondary | ICD-10-CM | POA: Diagnosis not present

## 2020-02-21 DIAGNOSIS — D509 Iron deficiency anemia, unspecified: Secondary | ICD-10-CM | POA: Diagnosis not present

## 2020-02-21 DIAGNOSIS — D500 Iron deficiency anemia secondary to blood loss (chronic): Secondary | ICD-10-CM | POA: Diagnosis not present

## 2020-02-21 DIAGNOSIS — I10 Essential (primary) hypertension: Secondary | ICD-10-CM | POA: Diagnosis not present

## 2020-02-21 DIAGNOSIS — Z86718 Personal history of other venous thrombosis and embolism: Secondary | ICD-10-CM | POA: Diagnosis not present

## 2020-03-01 DIAGNOSIS — L602 Onychogryphosis: Secondary | ICD-10-CM | POA: Diagnosis not present

## 2020-03-01 DIAGNOSIS — I739 Peripheral vascular disease, unspecified: Secondary | ICD-10-CM | POA: Diagnosis not present

## 2020-03-15 IMAGING — CR DG THORACIC SPINE 2V
3 series · 3 of 3 positions shown · non-contrast
Comparison: 01/01/2011 chest radiograph

CLINICAL DATA: Acute mid back pain following fall. Initial
encounter.

EXAM:
THORACIC SPINE 2 VIEWS

[t thoracic spine ap]
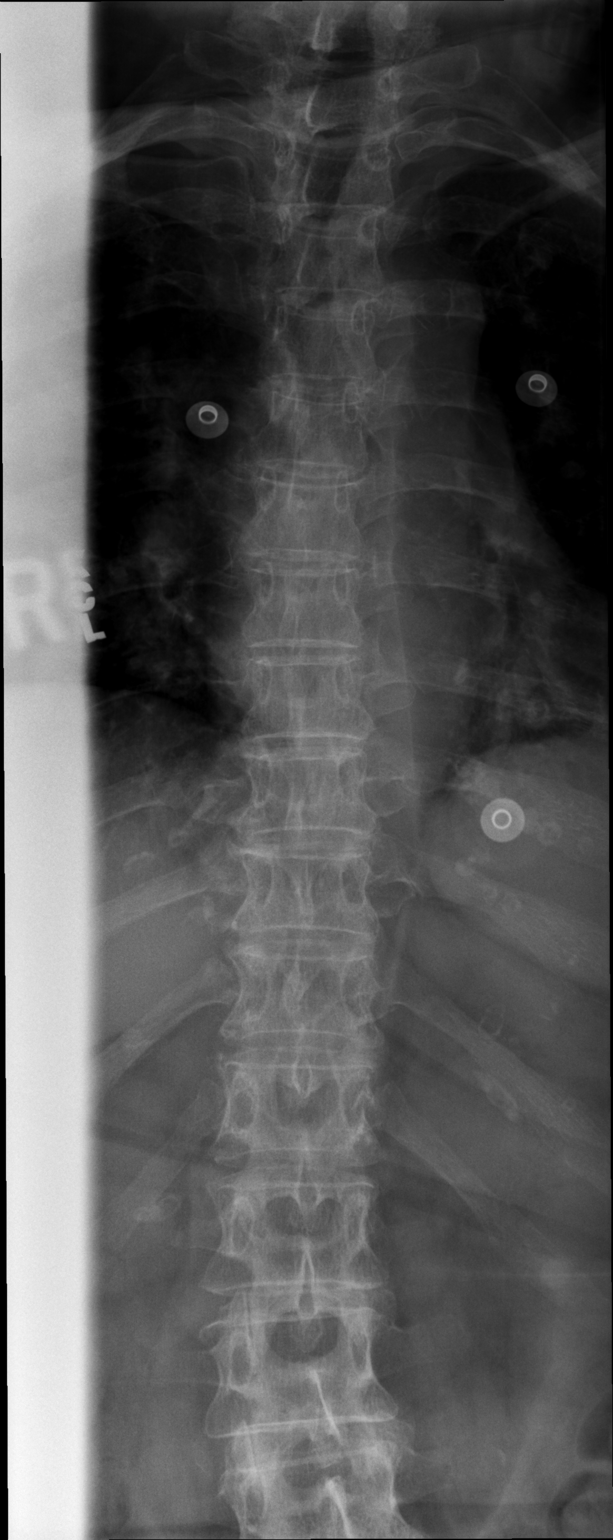

[t thoracic spine lat]
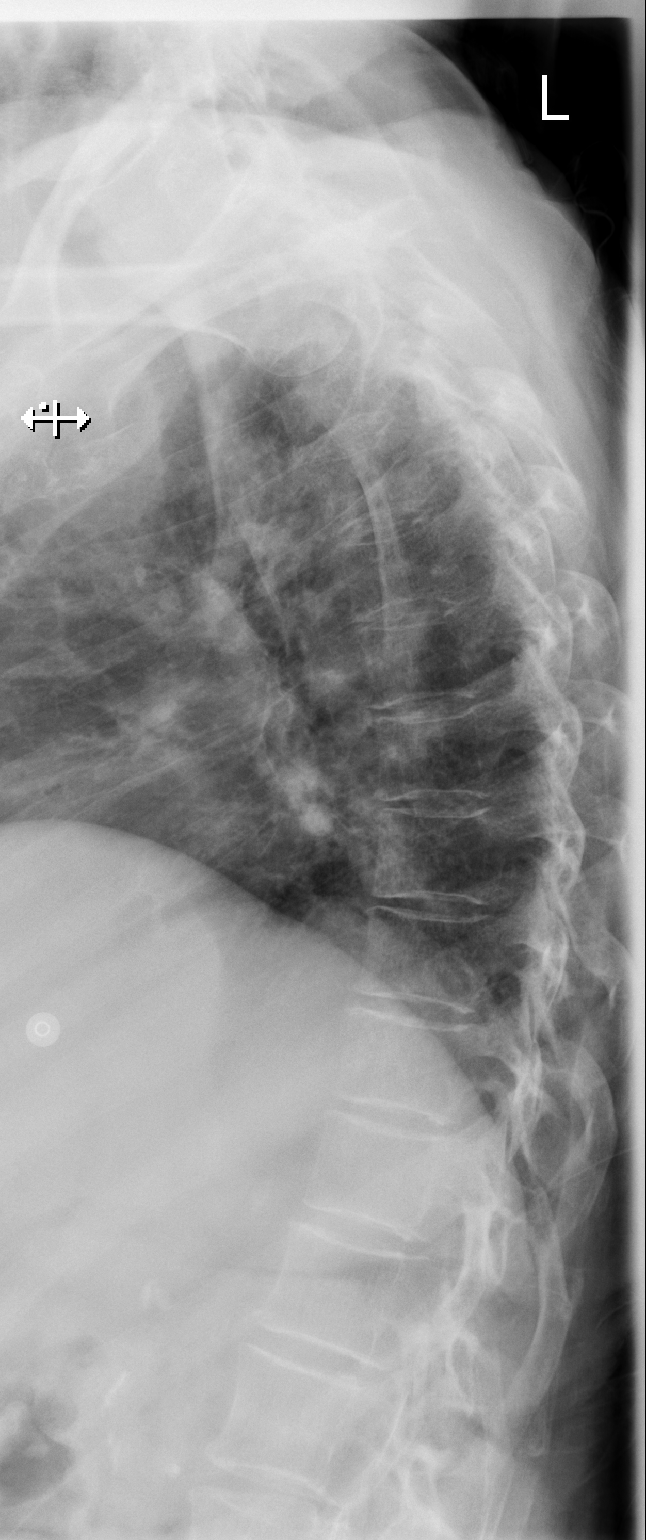

[t thoracic swimmers]
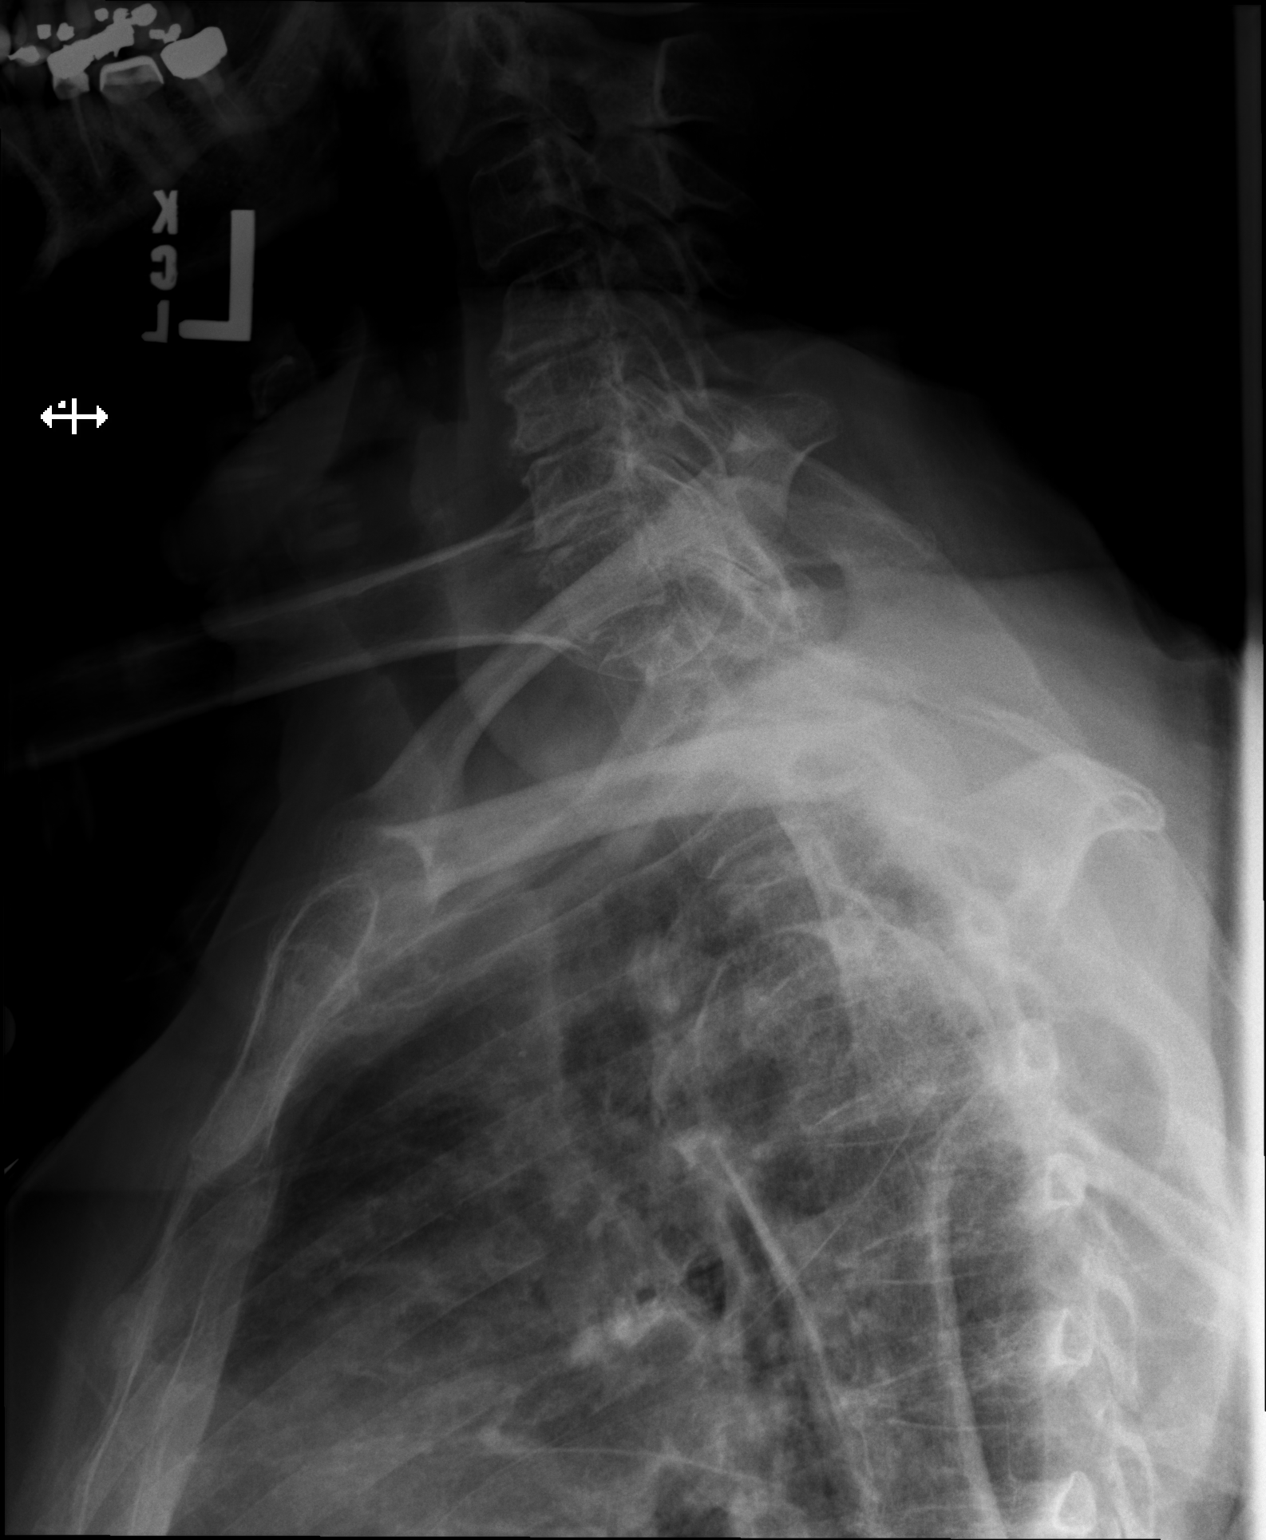

[3 of 3 positions shown; findings below may reference images not displayed]

FINDINGS: No acute fracture or subluxation.

No focal bony lesions are present.

Degenerative disc disease/spondylosis in the cervical spine
identified.
IMPRESSION: No acute abnormality.

## 2020-03-15 IMAGING — CT CT CERVICAL SPINE W/O CM
3 of 4 series · 12 of 33 positions shown, 14 images · non-contrast
Comparison: 12/08/2018

CLINICAL DATA: Unwitnessed fall at [HOSPITAL], laceration to back
of head, history of hypertension3

EXAM:
CT HEAD WITHOUT CONTRAST
CT CERVICAL SPINE WITHOUT CONTRAST
TECHNIQUE: Multidetector CT imaging of the head and cervical spine was
performed following the standard protocol without intravenous
contrast. Multiplanar CT image reconstructions of the cervical spine
were also generated.

[Series 5: orthogonal bone · axial · 0.27mm/px · z∈[-270,-183]mm · 4 of 80 slices shown, 5 images]
[im 14/80  soft-tissue]
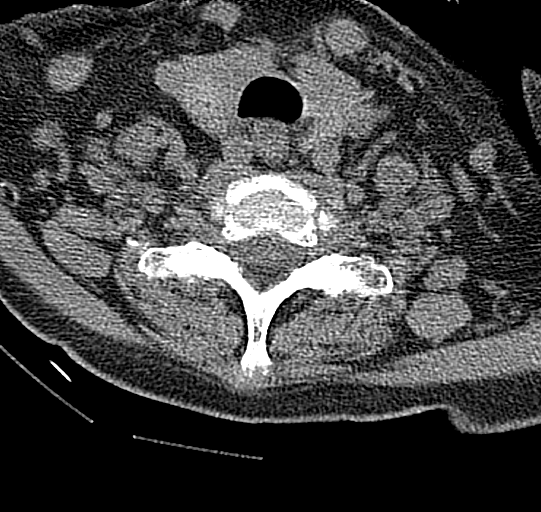
[im 14/80  bone]
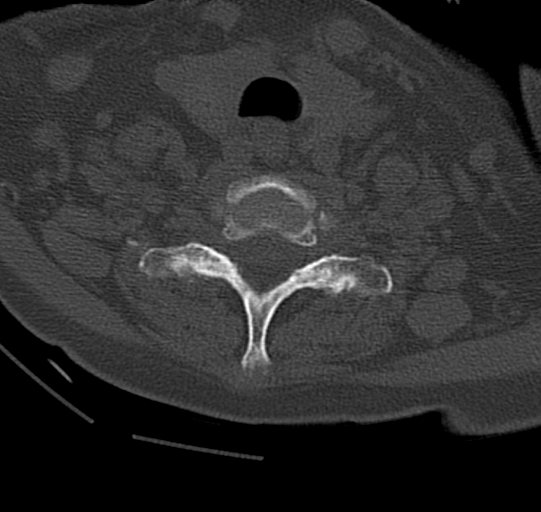
[im 27/80  bone]
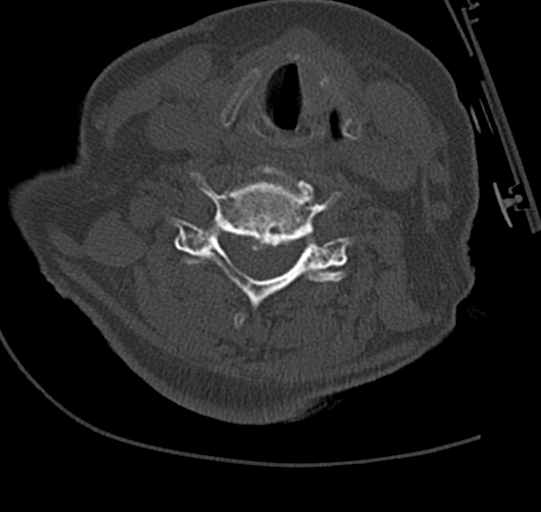
[im 53/80  bone]
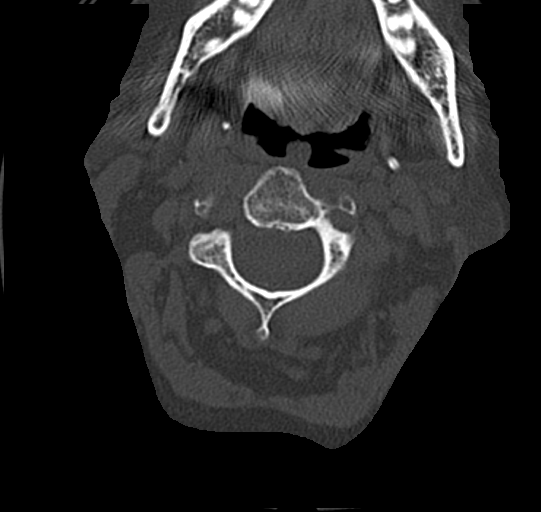
[im 66/80  bone]
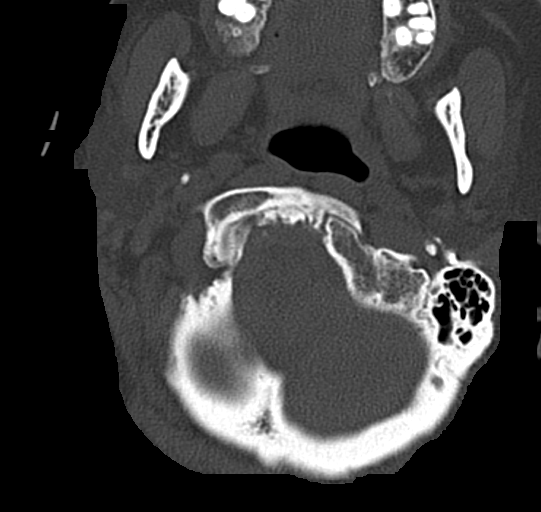

[Series 6: coronal bone · coronal · 0.25mm/px · 3 of 51 slices shown]
[im 12/51  bone]
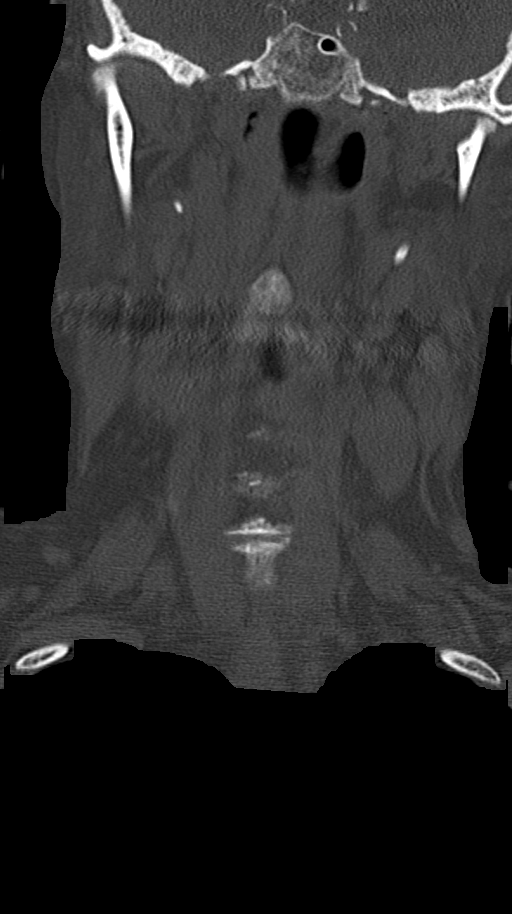
[im 21/51  bone]
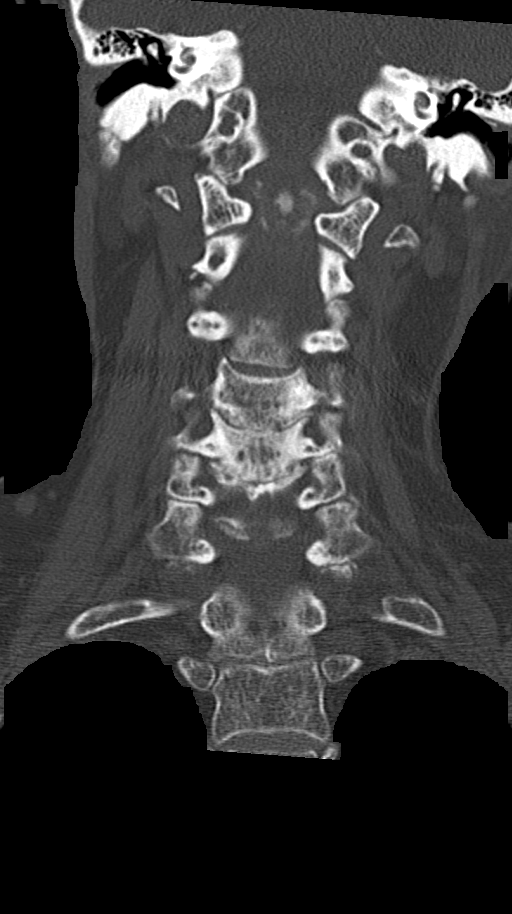
[im 30/51  bone]
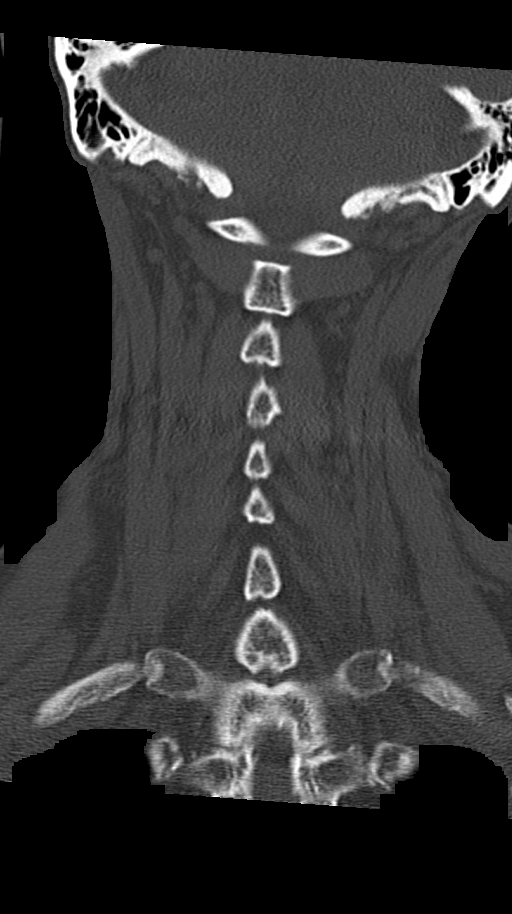

[Series 7: sagittal bone · sagittal · 0.31mm/px · 5 of 46 slices shown, 6 images]
[im 16/46  bone]
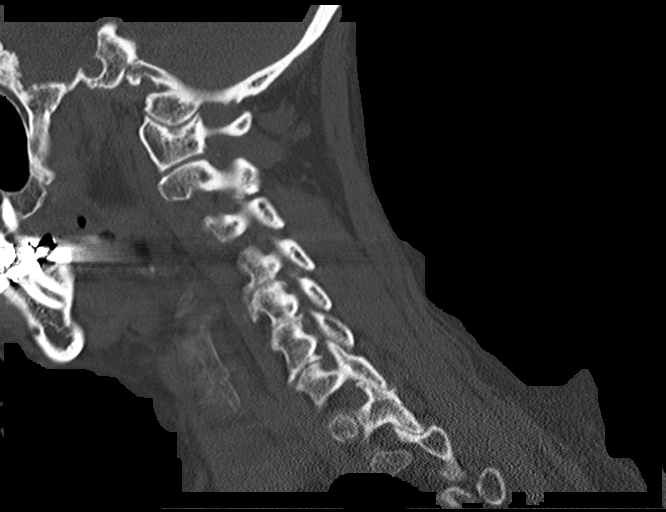
[im 19/46  bone]
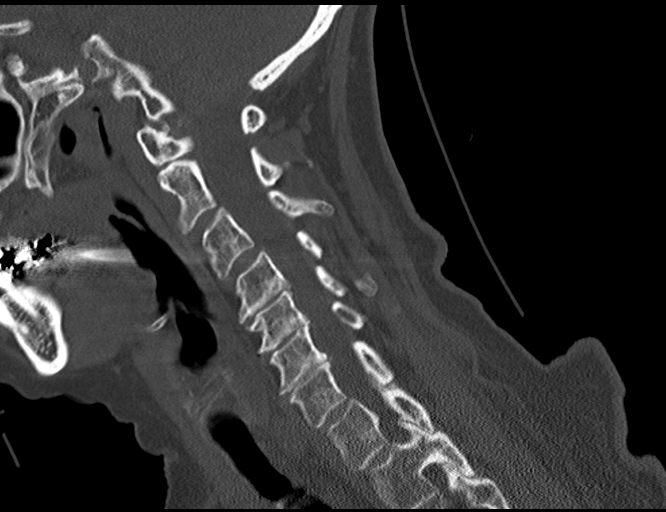
[im 23/46  soft-tissue]
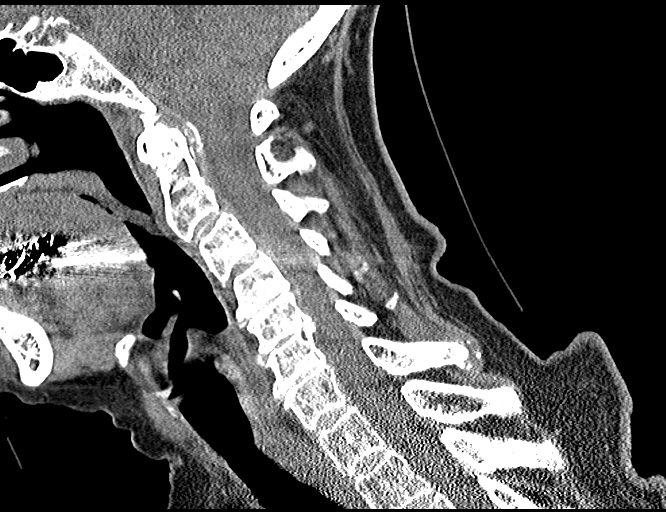
[im 23/46  bone]
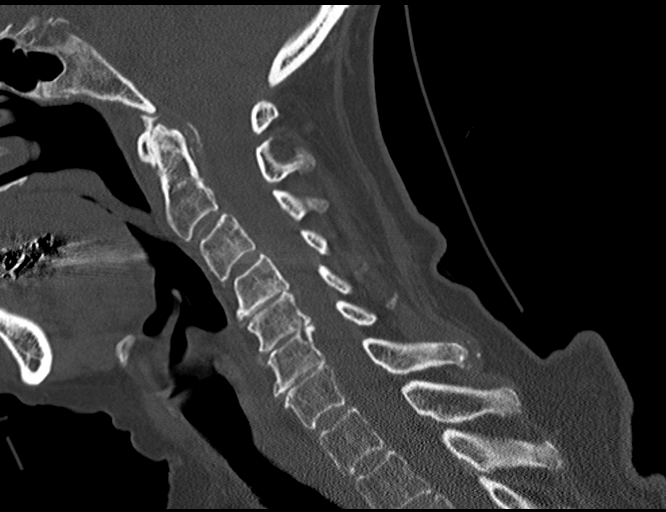
[im 27/46  bone]
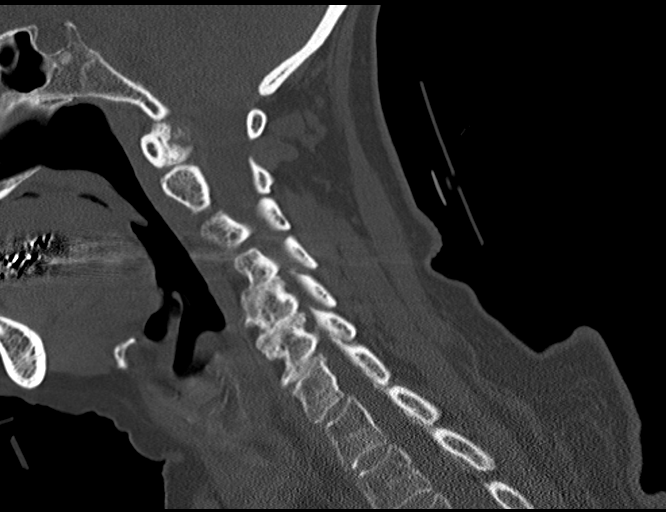
[im 31/46  bone]
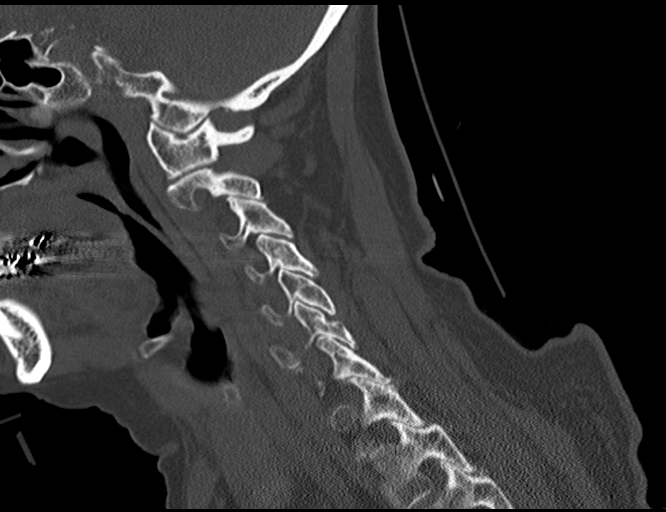

[12 of 33 positions shown; findings below may reference images not displayed]

FINDINGS: CT HEAD FINDINGS

Brain: Generalized atrophy. Normal ventricular morphology. No
midline shift or mass effect. Small vessel chronic ischemic changes
of deep cerebral white matter. No intracranial hemorrhage, mass
lesion, evidence of acute infarction, or extra-axial fluid
collection.

Vascular: No hyperdense vessels

Skull: Mild hyperostosis frontalis interna. Small exostosis at the
RIGHT frontal bone. Calvaria intact

Sinuses/Orbits: Clear

Other: N/A

CT CERVICAL SPINE FINDINGS

Alignment: Minimal retrolisthesis C4-C5 unchanged. Remaining
alignments normal.

Skull base and vertebrae: Osseous demineralization. Multilevel disc
space narrowing and endplate spur formation. Multilevel facet
degenerative changes. Visualized skull base intact. No acute
fracture, acute subluxation, or bone destruction.

Soft tissues and spinal canal: Prevertebral soft tissues normal
thickness.

Disc levels:  No additional abnormalities

Upper chest: Lung apices clear

Other: N/A
IMPRESSION: Atrophy with small vessel chronic ischemic changes of deep cerebral
white matter.

No acute intracranial abnormalities.

Multilevel degenerative disc and facet disease changes cervical
spine.

No acute cervical spine abnormalities.

## 2020-04-28 DIAGNOSIS — I10 Essential (primary) hypertension: Secondary | ICD-10-CM | POA: Diagnosis not present

## 2020-04-28 DIAGNOSIS — H04123 Dry eye syndrome of bilateral lacrimal glands: Secondary | ICD-10-CM | POA: Diagnosis not present

## 2020-04-28 DIAGNOSIS — Z961 Presence of intraocular lens: Secondary | ICD-10-CM | POA: Diagnosis not present

## 2020-05-27 ENCOUNTER — Non-Acute Institutional Stay: Payer: Medicare Other | Admitting: Hospice

## 2020-05-27 ENCOUNTER — Other Ambulatory Visit: Payer: Self-pay

## 2020-05-27 DIAGNOSIS — Z515 Encounter for palliative care: Secondary | ICD-10-CM | POA: Diagnosis not present

## 2020-05-27 DIAGNOSIS — G309 Alzheimer's disease, unspecified: Secondary | ICD-10-CM

## 2020-05-27 DIAGNOSIS — F0281 Dementia in other diseases classified elsewhere with behavioral disturbance: Secondary | ICD-10-CM

## 2020-05-27 NOTE — Progress Notes (Signed)
[facility] Consult Note Telephone: [phone]  Fax: [phone]     PATIENT NAME: Shirley Wright DOB: 1926/01/18 MRN: [id number]  PRIMARY CARE PROVIDER:   Laurey Morale, MD Laurey Morale, MD [place],  [place] [address]  REFERRING PROVIDER:Fry, Ishmael Holter, MD [place], [place] [address]  [place], daughter [phone]   RECOMMENDATIONS/PLAN: Follow-up visit to build trust and follow-up on palliative care.  Advance Care Planning/Goals of Care:   CODE STATUS reviewed.  Patientremains a DO NOT RESUSCITATE. Goals of care: Goals of care include to maximize quality of life and symptom management.Report on chart indicatesDaughter- Debbie ison board with hospice when that time comes.   Symptom management:Memory loss/confusion related to advanced dementia.  FLACC 0, 6D non ambulatory, self propels in wheelchair patient continues in incremental decline in functional status in line with dementia disease trajectory.  No report of hospitalization or medical acuity. Insomnia: Continue  Trazodone for insomnia, Depression. Well managed  with  Cymbalta for depression; Namenda for Dementia. She is currently followed by Psych.Patient denies moodiness; she continues to be cooperative and compliant with her medications. Nursing with no concerns.Continueongoingsupportive care. Follow TI:[id number] care will continue to follow patient for goals of care clarification and symptom management. I spent43minutes providing this consultation; time includes time with patient/nursing, chart review and documentation.More than 50% of the time in this consultation was spent on coordinating communication  HISTORY OF PRESENT ILLNESS:Shirley Wright a 87 y.o.year oldfemalewith multiple medical problems including dementia, HTN, depression, OA. Palliative Care was asked to help  address goals of care. CODE STATUS:DNR  PPS:40%  HOSPICE ELIGIBILITY/DIAGNOSIS: TBD  PAST MEDICAL HISTORY:  Past Medical History:  Diagnosis Date  . Adenomatous colon polyp    tubular  . Allergic rhinitis   . Diverticulosis   . GERD (gastroesophageal reflux disease)   . Hemorrhoids   . Hepatic cyst   . History of colonic polyps   . Hyperlipidemia   . Hypertension   . IBS (irritable bowel syndrome)   . Osteoarthritis     SOCIAL HX:  Social History   Tobacco Use  . Smoking status: Never Smoker  . Smokeless tobacco: Never Used  Substance Use Topics  . Alcohol use: No    Alcohol/week: 0.0 standard drinks    Comment: once every 2-3 months    ALLERGIES:  Allergies  Allergen Reactions  . Penicillins Other (See Comments)    "Allergic," per [facility] Did it involve swelling of the face/tongue/throat, SOB, or low BP? Unk Did it involve sudden or severe rash/hives, skin peeling, or any reaction on the inside of your mouth or nose? Unk Did you need to seek medical attention at a hospital or doctor's office? Unk When did it last happen? Unk If all above answers are "NO", may proceed with cephalosporin use.   . Pneumococcal Vaccine Polyvalent Other (See Comments)    "Allergic," per [facility]  . Tetanus-Diphtheria Toxoids Td Other (See Comments)    "Allergic," per [facility]     PERTINENT MEDICATIONS:  Outpatient Encounter Medications as of 05/27/2020  Medication Sig  . acetaminophen (TYLENOL) 325 MG tablet Take 325 mg by mouth See admin instructions. Take 325 mg by mouth three times a day and an additional 650 mg up to three times a day as needed for back pain  . cholecalciferol (VITAMIN D3) 25 MCG (1000 UT) tablet Take 1,000 Units by mouth daily.  . DULoxetine (CYMBALTA) 30  MG capsule Take 30-60 mg by mouth See admin instructions. Take 30 mg by mouth once a day for 2 weeks, then increase to 2 capsules (60 mg) once a day  . ferrous sulfate 325 (65 FE) MG tablet Take 325 mg by mouth daily  with breakfast.  . metoprolol succinate (TOPROL-XL) 25 MG 24 hr tablet Take 0.5 tablets (12.5 mg total) by mouth daily. Take with or immediately following a meal.  . NON FORMULARY Take 120 mLs by mouth See admin instructions. MedPass: Drink 120 ml's by mouth 2 times a day  . potassium chloride (KLOR-CON 10) 10 MEQ tablet Take 1 tablet (10 mEq total) by mouth daily.  . traMADol (ULTRAM) 50 MG tablet Take 1 tablet (50 mg total) by mouth every 6 (six) hours as needed (for pain).   No facility-administered encounter medications on file as of 05/27/2020.    PHYSICAL EXAM/ROS: Height 134Ibs down from 140 five months ago Height 5 feet 1 inch General: NAD, frail appearing Cardiovascular: regular rate and rhythm; denies chest pain Pulmonary: clear ant/post fields; normal respiratory effort on room air Abdomen: soft, nontender, + bowel sounds; no constipation GU: no suprapubic tenderness Extremities: no edema, no joint deformities Skin: no rashes to visible skin Neurological: Weakness but otherwise nonfocal; forgetful, confused    Note:  Portions of this note were generated with Lobbyist. Dictation errors may occur despite attempts at proofreading.  Teodoro Spray, NP

## 2020-05-31 DIAGNOSIS — I739 Peripheral vascular disease, unspecified: Secondary | ICD-10-CM | POA: Diagnosis not present

## 2020-05-31 DIAGNOSIS — L602 Onychogryphosis: Secondary | ICD-10-CM | POA: Diagnosis not present

## 2020-08-02 DIAGNOSIS — I10 Essential (primary) hypertension: Secondary | ICD-10-CM | POA: Diagnosis not present

## 2020-08-02 DIAGNOSIS — D649 Anemia, unspecified: Secondary | ICD-10-CM | POA: Diagnosis not present

## 2020-08-09 ENCOUNTER — Other Ambulatory Visit: Payer: Self-pay

## 2020-08-09 ENCOUNTER — Non-Acute Institutional Stay: Payer: Medicare Other | Admitting: Hospice

## 2020-08-09 DIAGNOSIS — M545 Low back pain, unspecified: Secondary | ICD-10-CM | POA: Diagnosis not present

## 2020-08-09 DIAGNOSIS — F0390 Unspecified dementia without behavioral disturbance: Secondary | ICD-10-CM

## 2020-08-09 DIAGNOSIS — Z515 Encounter for palliative care: Secondary | ICD-10-CM

## 2020-08-09 DIAGNOSIS — G8929 Other chronic pain: Secondary | ICD-10-CM | POA: Diagnosis not present

## 2020-08-09 NOTE — Progress Notes (Signed)
[place] Consult Note Telephone: [phone]  Fax: [phone]  PATIENT NAME: Shirley Wright DOB: Oct 30, 1925 MRN: [id number]  PRIMARY CARE PROVIDER:   Laurey Morale, MD Shirley Morale, MD [place],  [place] [address]  PRIMARY CARE PROVIDER:Dr. Josetta Wright REFERRING PROVIDER:Fry, Ishmael Holter, MD [place], [place] [address]  [place], daughter [phone]   Visit is to build trust and highlight Palliative Medicine as specialized medical care for people living with serious illness, aimed at facilitating better quality of life through symptoms relief, assisting with advance care plan and establishing goals of care. NP called and left Shirley Wright a voicemail with call back number.   CHIEF COMPLAINT: Palliative focused visit/low back pain  RECOMMENDATIONS/PLAN:   Advance Care Planning/Goals of Care: CODE STATUS reviewed today.  Patientremains a DO NOT RESUSCITATE.  Goals of care: Goals of care include to maximize quality of life and symptom management.Report on chart indicatesDaughter- Shirley Wright ison board with hospice when that time comes.  Visit consisted of counseling and education dealing with the complex and emotionally intense issues of symptom management and palliative care in the setting of serious and potentially life-threatening illness. Watching TV and reading/looking at pictures in [employer] help her to cope. Palliative care team will continue to support patient, patient's family, and medical team.  I spent 16  minutes providing this consultation. More than 50% of the time in this consultation was spent on coordinating communication.  -------------------------------------------------------------------------------------------------------------------------------------------------- 3. Symptom management/Plan:  Low back pain: Continue Tylenol 500mg  TID  daily prn pain. Activity as tolerated.  Palliative will continue to monitor for symptom management/decline and make recommendations as needed. Return 2 months or prn. Encouraged to call provider sooner with any concerns.   HISTORY OF PRESENT ILLNESS:  Shirley Wright is a 87 y.o. female with multiple medical problems including dementia - FAST 6d, HTN, depression, OA. Follow up visit for low back pain, aching pain, related to osteoarthritis; chronic low back pain with recent exacerbation impairing ADLs, well managed with Tylenol; improving, FLACC 0.  History obtained from review of EMR, discussion with patient/family.  Review and summarization of Epic records shows history from other than patient. Rest of 10 point ROS asked and negative.  Palliative Care was asked to follow this patient by consultation request of Shirley Morale, MD to help address complex decision making in the context of advance care planning and goals of care clarification.   CODE STATUS: DNR  PPS:  40%  HOSPICE ELIGIBILITY/DIAGNOSIS: TBD  PAST MEDICAL HISTORY:  Past Medical History:  Diagnosis Date  . Adenomatous colon polyp    tubular  . Allergic rhinitis   . Diverticulosis   . GERD (gastroesophageal reflux disease)   . Hemorrhoids   . Hepatic cyst   . History of colonic polyps   . Hyperlipidemia   . Hypertension   . IBS (irritable bowel syndrome)   . Osteoarthritis     SOCIAL HX: @SOCX  Patient at SNF for ongoing care   FAMILY HX:  Family History  Problem Relation Age of Onset  . Arthritis Other   . Diabetes Other   . Hypertension Other   . Lung cancer Other   . Heart disease Other     Review lab tests/diagnostics No results for input(s): WBC, HGB, HCT, PLT, MCV in the last 168 hours. No results for input(s): NA, K, CL, CO2, BUN, CREATININE, GLUCOSE in the last 168 hours. Latest  GFR by Cockcroft Gault (not valid in AKI or ESRD) CrCl cannot be calculated (Patient's most recent lab result is older  than the maximum 21 days allowed.). No results for input(s): AST, ALT, ALKPHOS, GGT in the last 168 hours.  Invalid input(s): TBILI, CONJBILI, ALB, TOTALPROTEIN No components found for: ALB No results for input(s): APTT, INR in the last 168 hours.  Invalid input(s): PTPATIENT No results for input(s): BNP, PROBNP in the last 168 hours.  ALLERGIES:  Allergies  Allergen Reactions  . Penicillins Other (See Comments)    "Allergic," per [facility] Did it involve swelling of the face/tongue/throat, SOB, or low BP? Unk Did it involve sudden or severe rash/hives, skin peeling, or any reaction on the inside of your mouth or nose? Unk Did you need to seek medical attention at a hospital or doctor's office? Unk When did it last happen? Unk If all above answers are "NO", may proceed with cephalosporin use.   . Pneumococcal Vaccine Polyvalent Other (See Comments)    "Allergic," per [facility]  . Tetanus-Diphtheria Toxoids Td Other (See Comments)    "Allergic," per [facility]      PERTINENT MEDICATIONS:  Outpatient Encounter Medications as of 08/09/2020  Medication Sig  . acetaminophen (TYLENOL) 325 MG tablet Take 325 mg by mouth See admin instructions. Take 325 mg by mouth three times a day and an additional 650 mg up to three times a day as needed for back pain  . cholecalciferol (VITAMIN D3) 25 MCG (1000 UT) tablet Take 1,000 Units by mouth daily.  . DULoxetine (CYMBALTA) 30 MG capsule Take 30-60 mg by mouth See admin instructions. Take 30 mg by mouth once a day for 2 weeks, then increase to 2 capsules (60 mg) once a day  . ferrous sulfate 325 (65 FE) MG tablet Take 325 mg by mouth daily with breakfast.  . metoprolol succinate (TOPROL-XL) 25 MG 24 hr tablet Take 0.5 tablets (12.5 mg total) by mouth daily. Take with or immediately following a meal.  . NON FORMULARY Take 120 mLs by mouth See admin instructions. MedPass: Drink 120 ml's by mouth 2 times a day  . potassium chloride (KLOR-CON 10) 10 MEQ tablet Take 1  tablet (10 mEq total) by mouth daily.  . traMADol (ULTRAM) 50 MG tablet Take 1 tablet (50 mg total) by mouth every 6 (six) hours as needed (for pain).   No facility-administered encounter medications on file as of 08/09/2020.    ROS  General: NAD EYES: No vision changes ENMT: No dysphagia no xerostomia Cardiovascular: No chest pain Pulmonary: No cough, SOB  Abdomen:no constipation or diarrhea GU: No dysuria or urinary frequency MSK:   ROM limitations, no falls reported Skin: No rashes or wounds Neurological: weakness Psych:  positive mood Heme/lymph/immuno: No bruises or abnormal bleeding   PHYSICAL EXAM  General: In no acute distress,  Cardiovascular: regular rate and rhythm; no edema in BLE Pulmonary: no cough, no increased work of breathing, normal respiratory effort Abdomen: soft, non tender, positive bowel sounds in all quadrants GU:  no suprapubic tenderness Eyes: Normal lids, no discharge, sclera anicteric ENMT: Moist mucous membranes Musculoskeletal:  Gets around in her wheelchair Skin: no rash to visible skin, warm without cyanosis Psych: non-anxious affect Neurological: Weakness but otherwise non focal Heme/lymph/immuno: no bruises, no bleeding  Thank you for the opportunity to participate in the care of [place] Please call our office at [phone] if we can be of additional assistance.  Note: Portions of this note were  generated with Lobbyist. Dictation errors may occur despite best attempts at proofreading.  Teodoro Spray, NP

## 2020-08-11 DIAGNOSIS — L602 Onychogryphosis: Secondary | ICD-10-CM | POA: Diagnosis not present

## 2020-08-11 DIAGNOSIS — I739 Peripheral vascular disease, unspecified: Secondary | ICD-10-CM | POA: Diagnosis not present

## 2020-08-30 DIAGNOSIS — D649 Anemia, unspecified: Secondary | ICD-10-CM | POA: Diagnosis not present

## 2020-08-30 DIAGNOSIS — I10 Essential (primary) hypertension: Secondary | ICD-10-CM | POA: Diagnosis not present

## 2020-09-27 ENCOUNTER — Non-Acute Institutional Stay: Payer: Medicare Other | Admitting: Hospice

## 2020-09-27 DIAGNOSIS — I10 Essential (primary) hypertension: Secondary | ICD-10-CM | POA: Diagnosis not present

## 2020-09-27 DIAGNOSIS — F0391 Unspecified dementia with behavioral disturbance: Secondary | ICD-10-CM

## 2020-09-27 DIAGNOSIS — Z515 Encounter for palliative care: Secondary | ICD-10-CM | POA: Diagnosis not present

## 2020-09-27 NOTE — Progress Notes (Signed)
[place] [facility] Consult Note Telephone: [phone]  Fax: [phone]  PATIENT NAME: Shirley Wright DOB: 03-Aug-1925 MRN: [id number]  PRIMARY CARE PROVIDER:   Laurey Morale, MD Laurey Morale, MD [place],  [place] [address]  REFERRING PROVIDER: Laurey Morale, MD Laurey Morale, MD [place],  [place] [address]  [place], daughter [phone] Contact Information    Name Relation Home Work Mobile   Pace,Debra Daughter   [phone]   [place], [place] [phone]     Elizebeth, [place] [phone]        Visit is to build trust and highlight Palliative Medicine as specialized medical care for people living with serious illness, aimed at facilitating better quality of life through symptoms relief, assisting with advance care planning and complex medical decision making.   RECOMMENDATIONS/PLAN:   Advance Care Planning/Code Status: Patient is a DO NOT RESUSCITATE.  Family is open to hospice services in the future.  Goals of Care: Goals of care include to maximize quality of life and symptom management.  Visit consisted of counseling and education dealing with the complex and emotionally intense issues of symptom management and palliative care in the setting of serious and potentially life-threatening illness. Palliative care team will continue to support patient, patient's family, and medical team.  Symptom management/Plan:  Hypertension: Continue amlodipine, Valsartan.  Hold if systolic blood pressure less than 115.  No added salt. Low back pain: Well managed.-Continue Tylenol daily as needed for pain. Alzheimer dementia:  FAST 6D, FLACC 0 continue ongoing supportive care. Follow up: Palliative care will continue to follow for complex medical decision making, advance care planning, and clarification of goals. Return 6 weeks or prn. Encouraged to call provider sooner  with any concerns.  CHIEF COMPLAINT: Palliative follow up visit/hypertension  HISTORY OF PRESENT ILLNESS:  Shirley Wright a 87 y.o. female with multiple medical problems including essential hypertension systolic BP ranging between 150s to 110s. She denies headaches, dizziness, syncope. History of Alzheimer dementia, low back pain, depression, unsteadiness on feet.  History obtained from review of EMR, discussion with primary team, family, caregiver  and/or patient. Records reviewed and summarized above. All 10 point systems reviewed and are negative except as documented in history of present illness above  Review and summarization of Epic records shows history from other than patient.   Palliative Care was asked to follow this patient by consultation request of Laurey Morale, MD to help address complex decision making in the context of advance care planning and goals of care clarification.   PPS: 40%  ROS  General: NAD, appropriately dressed Constitution: Denies fever/chills EYES: denies vision changes ENMT: denies Xerostomia,  dysphagia Cardiovascular: denies chest pain Pulmonary: denies  cough, denies dyspnea  Abdomen: endorses fair appetite, denies constipation or diarrhea GU: denies dysuria MSK:  endorses ROM limitations, no falls reported Skin: denies rashes/bruising Neurological: endorses weakness, denies pain, denies insomnia Psych: Endorses positive mood Heme/lymph/immuno: denies bruises, no abnormal bleeding   PHYSICAL EXAM  BP 131/66 P 65 R 18 02 95% General: In no acute distress, appropriately dressed Cardiovascular: regular rate and rhythm; no edema in BLE Pulmonary: no cough, no increased work of breathing, normal respiratory effort Abdomen: soft, non tender, no guarding, positive bowel sounds in all quadrants GU:  no suprapubic tenderness Eyes: Normal lids, no discharge, sclera anicteric ENMT: Moist mucous membranes Musculoskeletal:  Weakness, wheelchair-bound-  self propels Skin: no rash to  visible skin, warm without cyanosis,  Psych: non-anxious affect Neurological: Weakness but otherwise non focal Heme/lymph/immuno: no bruises, no bleeding  PERTINENT MEDICATIONS:  Outpatient Encounter Medications as of 09/27/2020  Medication Sig  . acetaminophen (TYLENOL) 325 MG tablet Take 325 mg by mouth See admin instructions. Take 325 mg by mouth three times a day and an additional 650 mg up to three times a day as needed for back pain  . cholecalciferol (VITAMIN D3) 25 MCG (1000 UT) tablet Take 1,000 Units by mouth daily.  . DULoxetine (CYMBALTA) 30 MG capsule Take 30-60 mg by mouth See admin instructions. Take 30 mg by mouth once a day for 2 weeks, then increase to 2 capsules (60 mg) once a day  . ferrous sulfate 325 (65 FE) MG tablet Take 325 mg by mouth daily with breakfast.  . metoprolol succinate (TOPROL-XL) 25 MG 24 hr tablet Take 0.5 tablets (12.5 mg total) by mouth daily. Take with or immediately following a meal.  . NON FORMULARY Take 120 mLs by mouth See admin instructions. MedPass: Drink 120 ml's by mouth 2 times a day  . potassium chloride (KLOR-CON 10) 10 MEQ tablet Take 1 tablet (10 mEq total) by mouth daily.  . traMADol (ULTRAM) 50 MG tablet Take 1 tablet (50 mg total) by mouth every 6 (six) hours as needed (for pain).   No facility-administered encounter medications on file as of 09/27/2020.    HOSPICE ELIGIBILITY/DIAGNOSIS: TBD  PAST MEDICAL HISTORY:  Past Medical History:  Diagnosis Date  . Adenomatous colon polyp    tubular  . Allergic rhinitis   . Diverticulosis   . GERD (gastroesophageal reflux disease)   . Hemorrhoids   . Hepatic cyst   . History of colonic polyps   . Hyperlipidemia   . Hypertension   . IBS (irritable bowel syndrome)   . Osteoarthritis      SOCIAL HX: @SOCX  Patient lives at SNF   for ongoing care  FAMILY HX:  Family History  Problem Relation Age of Onset  . Arthritis Other   . Diabetes Other   .  Hypertension Other   . Lung cancer Other   . Heart disease Other     ALLERGIES:  Allergies  Allergen Reactions  . Penicillins Other (See Comments)    "Allergic," per [facility] Did it involve swelling of the face/tongue/throat, SOB, or low BP? Unk Did it involve sudden or severe rash/hives, skin peeling, or any reaction on the inside of your mouth or nose? Unk Did you need to seek medical attention at a hospital or doctor's office? Unk When did it last happen? Unk If all above answers are "NO", may proceed with cephalosporin use.   . Pneumococcal Vaccine Polyvalent Other (See Comments)    "Allergic," per [facility]  . Tetanus-Diphtheria Toxoids Td Other (See Comments)    "Allergic," per [facility]      I spent 50  minutes providing this consultation; this includes time spent with patient/family, chart review and documentation. More than 50% of the time in this consultation was spent on counseling and coordinating communication   Thank you for the opportunity to participate in the care of [place] Please call our office at [phone] if we can be of additional assistance.  Note: Portions of this note were generated with Lobbyist. Dictation errors may occur despite best attempts at proofreading.  Teodoro Spray, NP

## 2020-09-28 ENCOUNTER — Other Ambulatory Visit: Payer: Self-pay

## 2020-10-20 DIAGNOSIS — R601 Generalized edema: Secondary | ICD-10-CM | POA: Diagnosis not present

## 2020-10-21 DIAGNOSIS — R601 Generalized edema: Secondary | ICD-10-CM | POA: Diagnosis not present

## 2020-10-22 DIAGNOSIS — I10 Essential (primary) hypertension: Secondary | ICD-10-CM | POA: Diagnosis not present

## 2020-10-24 DIAGNOSIS — L602 Onychogryphosis: Secondary | ICD-10-CM | POA: Diagnosis not present

## 2020-10-24 DIAGNOSIS — I739 Peripheral vascular disease, unspecified: Secondary | ICD-10-CM | POA: Diagnosis not present

## 2021-01-27 ENCOUNTER — Non-Acute Institutional Stay: Payer: Medicare Other | Admitting: Hospice

## 2021-01-27 ENCOUNTER — Other Ambulatory Visit: Payer: Self-pay

## 2021-01-27 DIAGNOSIS — Z515 Encounter for palliative care: Secondary | ICD-10-CM | POA: Diagnosis not present

## 2021-01-27 DIAGNOSIS — G4700 Insomnia, unspecified: Secondary | ICD-10-CM | POA: Diagnosis not present

## 2021-01-27 DIAGNOSIS — F339 Major depressive disorder, recurrent, unspecified: Secondary | ICD-10-CM

## 2021-01-27 DIAGNOSIS — G309 Alzheimer's disease, unspecified: Secondary | ICD-10-CM | POA: Diagnosis not present

## 2021-01-27 DIAGNOSIS — F0281 Dementia in other diseases classified elsewhere with behavioral disturbance: Secondary | ICD-10-CM

## 2021-01-27 NOTE — Progress Notes (Signed)
[facility] Pall[facility] Consult Note Telephone: [phone]  Fax: [phone]  PATIENT NAME: Shirley Wright DOB: 02/26/1926 MRN: [id number]  PRIMARY CARE PROVIDER:   Laurey Morale, MD Shirley Morale, MD [place],  [place] [address]  REFERRING PROVIDER: Laurey Morale, MD Shirley Morale, MD [place],  [place] [address]  RESPONSIBLE PARTY:Shirley   Aris Wright, daughter [phone] Contact Information     Name Relation Home Work Mobile   Wright,Shirley Daughter   [phone]   [place], Wright [phone]     Shirley, Wright [phone]         Visit is to build trust and highlight Palliative Medicine as specialized medical care for people living with serious illness, aimed at facilitating better quality of life through symptoms relief, assisting with advance care planning and complex medical decision making. [name] with NP for visit. Shirley joined visit. This is a follow up visit.  RECOMMENDATIONS/PLAN:   Advance Care Planning/Code Status: Patient remains a DO NOT RESUSCITATE.  Signed DNR in epic.  Goals of Care: Goals of care include to maximize quality of life and symptom management. Shirley said family is interested in hospice service when patient qualifies for it.   Visit consisted of counseling and education dealing with the complex and emotionally intense issues of symptom management and palliative care in the setting of serious and potentially life-threatening illness. Palliative care team will continue to support patient, patient's family, and medical team.  Symptom management/Plan:  Dementia: Ongoing memory loss and confusion in line with dementia disease trajectory.  Patient is followed by psych, [facility] recently discontinued.  Continue ongoing supportive care. Patient declined from FAST D  four months ago to currently FAST 7A,  FLACC 0, not able to engage in meaningful  discussion; limited language, hoyer lift for transfers.  Depression: Managed with Cymbalta, well-tolerated, effective Insomnia: Managed with trazodone. Follow up: Palliative care will continue to follow for complex medical decision making, advance care planning, and clarification of goals. Return 6 weeks or prn. Encouraged to call provider sooner with any concerns.  CHIEF COMPLAINT: Palliative follow up  HISTORY OF PRESENT ILLNESS:  Shirley Wright a 87 y.o. female with multiple medical problems including Alzheimer dementia, depression, low back pain.  No report of fall or hospitalization since last visit.  History obtained from review of EMR, discussion with primary team, family and/or patient. Records reviewed and summarized above. All 10 point systems reviewed and are negative except as documented in history of present illness above  Review and summarization of Epic records shows history from other than patient.   Palliative Care was asked to follow this patient o help address complex decision making in the context of advance care planning and goals of care clarification.   PHYSICAL EXAM  General: In no acute distress, appropriately dressed Cardiovascular: regular rate and rhythm; no edema in BLE Pulmonary: no cough, no increased work of breathing, normal respiratory effort Abdomen: soft, non tender, no guarding, positive bowel sounds in all quadrants GU:  no suprapubic tenderness Eyes: Normal lids, no discharge ENMT: Moist mucous membranes Musculoskeletal:  weakness, non ambulatory Skin: no rash to visible skin, warm without cyanosis,  Psych: non-anxious affect Neurological: Weakness but otherwise non focal Heme/lymph/immuno: no bruises, no bleeding  PERTINENT MEDICATIONS:  Outpatient Encounter Medications as of 01/27/2021  Medication Sig   acetaminophen (TYLENOL) 325 MG tablet Take 325 mg by mouth See admin instructions. Take  325 mg by mouth three times a day and an additional 650  mg up to three times a day as needed for back pain   cholecalciferol (VITAMIN D3) 25 MCG (1000 UT) tablet Take 1,000 Units by mouth daily.   DULoxetine (CYMBALTA) 30 MG capsule Take 30-60 mg by mouth See admin instructions. Take 30 mg by mouth once a day for 2 weeks, then increase to 2 capsules (60 mg) once a day   ferrous sulfate 325 (65 FE) MG tablet Take 325 mg by mouth daily with breakfast.   metoprolol succinate (TOPROL-XL) 25 MG 24 hr tablet Take 0.5 tablets (12.5 mg total) by mouth daily. Take with or immediately following a meal.   NON FORMULARY Take 120 mLs by mouth See admin instructions. MedPass: Drink 120 ml's by mouth 2 times a day   potassium chloride (KLOR-CON 10) 10 MEQ tablet Take 1 tablet (10 mEq total) by mouth daily.   traMADol (ULTRAM) 50 MG tablet Take 1 tablet (50 mg total) by mouth every 6 (six) hours as needed (for pain).   No facility-administered encounter medications on file as of 01/27/2021.    HOSPICE ELIGIBILITY/DIAGNOSIS: TBD  PAST MEDICAL HISTORY:  Past Medical History:  Diagnosis Date   Adenomatous colon polyp    tubular   Allergic rhinitis    Diverticulosis    GERD (gastroesophageal reflux disease)    Hemorrhoids    Hepatic cyst    History of colonic polyps    Hyperlipidemia    Hypertension    IBS (irritable bowel syndrome)    Osteoarthritis      ALLERGIES:  Allergies  Allergen Reactions   Penicillins Other (See Comments)    "Allergic," per [facility] Did it involve swelling of the face/tongue/throat, SOB, or low BP? Unk Did it involve sudden or severe rash/hives, skin peeling, or any reaction on the inside of your mouth or nose? Unk Did you need to seek medical attention at a hospital or doctor's office? Unk When did it last happen? Unk If all above answers are "NO", may proceed with cephalosporin use.    Pneumococcal Vaccine Polyvalent Other (See Comments)    "Allergic," per [facility]   Tetanus-Diphtheria Toxoids Td Other (See Comments)     "Allergic," per [facility]      I spent 50 minutes providing this consultation; this includes time spent with patient/family, chart review and documentation. More than 50% of the time in this consultation was spent on counseling and coordinating communication   Thank you for the opportunity to participate in the care of [place] Please call our office at [phone] if we can be of additional assistance.  Note: Portions of this note were generated with Lobbyist. Dictation errors may occur despite best attempts at proofreading.  Teodoro Spray, NP

## 2021-02-09 DIAGNOSIS — D649 Anemia, unspecified: Secondary | ICD-10-CM | POA: Diagnosis not present

## 2021-02-09 DIAGNOSIS — I10 Essential (primary) hypertension: Secondary | ICD-10-CM | POA: Diagnosis not present

## 2021-02-23 DIAGNOSIS — I739 Peripheral vascular disease, unspecified: Secondary | ICD-10-CM | POA: Diagnosis not present

## 2021-02-23 DIAGNOSIS — B351 Tinea unguium: Secondary | ICD-10-CM | POA: Diagnosis not present

## 2021-04-21 ENCOUNTER — Other Ambulatory Visit: Payer: Self-pay

## 2021-04-21 ENCOUNTER — Non-Acute Institutional Stay: Payer: Medicare Other | Admitting: Hospice

## 2021-04-21 DIAGNOSIS — F0390 Unspecified dementia without behavioral disturbance: Secondary | ICD-10-CM

## 2021-04-21 DIAGNOSIS — Z515 Encounter for palliative care: Secondary | ICD-10-CM

## 2021-04-21 DIAGNOSIS — G4700 Insomnia, unspecified: Secondary | ICD-10-CM | POA: Diagnosis not present

## 2021-04-21 DIAGNOSIS — F339 Major depressive disorder, recurrent, unspecified: Secondary | ICD-10-CM

## 2021-04-21 NOTE — Progress Notes (Signed)
[facility] Pall[facility] Consult Note Telephone: [phone]  Fax: [phone]  PATIENT NAME: Shirley Wright DOB: 1925-08-18 MRN: [id number]  PRIMARY CARE PROVIDER:   Laurey Morale, MD Shirley Morale, MD [place],  [place] [address]  REFERRING PROVIDER: Laurey Morale, MD Shirley Morale, MD [place],   [address]  RESPONSIBLE PARTY:Shirley   Aris Wright, daughter [phone] Contact Information     Name Relation Home Work Mobile   Wright,Shirley Daughter   [phone]   Wright, Shirley [phone]     Wright, Shirley [phone]         Visit is to build trust and highlight Palliative Medicine as specialized medical care for people living with serious illness, aimed at facilitating better quality of life through symptoms relief, assisting with advance care planning and complex medical decision making. This is a follow up visit.  RECOMMENDATIONS/PLAN:   Advance Care Planning/Code Status: Patient remains a DO NOT RESUSCITATE.  Signed DNR in epic.  Goals of Care: Goals of care include to maximize quality of life and symptom management. Family is interested in hospice service when patient qualifies for it.   Visit consisted of counseling and education dealing with the complex and emotionally intense issues of symptom management and palliative care in the setting of serious and potentially life-threatening illness. Palliative care team will continue to support patient, patient's family, and medical team.  Symptom management/Plan:  Dementia: Continue ongoing supportive care. Ongoing memory loss and confusion in line with dementia disease trajectory. Patient is FAST C,  FLACC 0, not able to engage in meaningful discussion; limited language, hoyer lift for transfers. In communication with facility to consider hospice service as patient continues to decline.  Depression: Managed with Cymbalta,  well-tolerated Insomnia: Managed with trazodone. Follow up: Palliative care will continue to follow for complex medical decision making, advance care planning, and clarification of goals. Return 6 weeks or prn. Encouraged to call provider sooner with any concerns.  CHIEF COMPLAINT: Palliative follow up  HISTORY OF PRESENT ILLNESS:  Shirley Wright a 87 y.o. female with multiple medical problems including Alzheimer dementia, depression, low back pain.  Patient is content, denies pain/discomfort; nursing with no concerns. No report of fall or hospitalization since last visit.  History obtained from review of EMR, discussion with primary team, family and/or patient. Records reviewed and summarized above. All 10 point systems reviewed and are negative except as documented in history of present illness above  Review and summarization of Epic records shows history from other than patient.   Palliative Care was asked to follow this patient o help address complex decision making in the context of advance care planning and goals of care clarification.   PHYSICAL EXAM  General: In no acute distress, appropriately dressed, FLACC 0 Cardiovascular: regular rate and rhythm; no edema in BLE Pulmonary: no cough, no increased work of breathing, normal respiratory effort Abdomen: soft, non tender, no guarding, positive bowel sounds in all quadrants GU:  no suprapubic tenderness Eyes: Normal lids, no discharge ENMT: Moist mucous membranes Musculoskeletal:  weakness, non ambulatory Skin: no rash to visible skin, warm without cyanosis,  Psych: coping, non-anxious affect Neurological: Weakness but otherwise non focal Heme/lymph/immuno: no bruises, no bleeding  PERTINENT MEDICATIONS:  Outpatient Encounter Medications as of 04/21/2021  Medication Sig   acetaminophen (TYLENOL) 325 MG tablet Take 325 mg by mouth See admin instructions. Take 325 mg by mouth three times a  day and an additional 650 mg up to three  times a day as needed for back pain   cholecalciferol (VITAMIN D3) 25 MCG (1000 UT) tablet Take 1,000 Units by mouth daily.   DULoxetine (CYMBALTA) 30 MG capsule Take 30-60 mg by mouth See admin instructions. Take 30 mg by mouth once a day for 2 weeks, then increase to 2 capsules (60 mg) once a day   ferrous sulfate 325 (65 FE) MG tablet Take 325 mg by mouth daily with breakfast.   metoprolol succinate (TOPROL-XL) 25 MG 24 hr tablet Take 0.5 tablets (12.5 mg total) by mouth daily. Take with or immediately following a meal.   NON FORMULARY Take 120 mLs by mouth See admin instructions. MedPass: Drink 120 ml's by mouth 2 times a day   potassium chloride (KLOR-CON 10) 10 MEQ tablet Take 1 tablet (10 mEq total) by mouth daily.   traMADol (ULTRAM) 50 MG tablet Take 1 tablet (50 mg total) by mouth every 6 (six) hours as needed (for pain).   No facility-administered encounter medications on file as of 04/21/2021.    HOSPICE ELIGIBILITY/DIAGNOSIS: TBD  PAST MEDICAL HISTORY:  Past Medical History:  Diagnosis Date   Adenomatous colon polyp    tubular   Allergic rhinitis    Diverticulosis    GERD (gastroesophageal reflux disease)    Hemorrhoids    Hepatic cyst    History of colonic polyps    Hyperlipidemia    Hypertension    IBS (irritable bowel syndrome)    Osteoarthritis      ALLERGIES:  Allergies  Allergen Reactions   Penicillins Other (See Comments)    "Allergic," per [facility] Did it involve swelling of the face/tongue/throat, SOB, or low BP? Unk Did it involve sudden or severe rash/hives, skin peeling, or any reaction on the inside of your mouth or nose? Unk Did you need to seek medical attention at a hospital or doctor's office? Unk When did it last happen? Unk If all above answers are "NO", may proceed with cephalosporin use.    Pneumococcal Vaccine Polyvalent Other (See Comments)    "Allergic," per [facility]   Tetanus-Diphtheria Toxoids Td Other (See Comments)    "Allergic," per [facility]       I spent 40 minutes providing this consultation; this includes time spent with patient/family, chart review and documentation. More than 50% of the time in this consultation was spent on counseling and coordinating communication   Thank you for the opportunity to participate in the care of [place] Please call our office at [phone] if we can be of additional assistance.  Note: Portions of this note were generated with Lobbyist. Dictation errors may occur despite best attempts at proofreading.  Teodoro Spray, NP

## 2021-06-14 DIAGNOSIS — I10 Essential (primary) hypertension: Secondary | ICD-10-CM | POA: Diagnosis not present

## 2021-06-14 DIAGNOSIS — H04123 Dry eye syndrome of bilateral lacrimal glands: Secondary | ICD-10-CM | POA: Diagnosis not present

## 2021-06-14 DIAGNOSIS — Z961 Presence of intraocular lens: Secondary | ICD-10-CM | POA: Diagnosis not present

## 2021-06-20 ENCOUNTER — Other Ambulatory Visit: Payer: Self-pay

## 2021-06-20 ENCOUNTER — Non-Acute Institutional Stay: Payer: Medicare Other | Admitting: Hospice

## 2021-06-20 DIAGNOSIS — M542 Cervicalgia: Secondary | ICD-10-CM

## 2021-06-20 DIAGNOSIS — F339 Major depressive disorder, recurrent, unspecified: Secondary | ICD-10-CM

## 2021-06-20 DIAGNOSIS — F0390 Unspecified dementia without behavioral disturbance: Secondary | ICD-10-CM

## 2021-06-20 DIAGNOSIS — G4700 Insomnia, unspecified: Secondary | ICD-10-CM | POA: Diagnosis not present

## 2021-06-20 DIAGNOSIS — Z515 Encounter for palliative care: Secondary | ICD-10-CM

## 2021-06-20 NOTE — Progress Notes (Signed)
[facility] Pall[facility] Consult Note Telephone: [phone]  Fax: [phone]  PATIENT NAME: Shirley Wright DOB: [birth year] MRN: [id number]  PRIMARY CARE PROVIDER:   Laurey Morale, MD Shirley Morale, MD [place],  [place] [address]  REFERRING PROVIDER: Laurey Morale, MD Shirley Morale, MD [place],  [place] [address]  RESPONSIBLE PARTY:Shirley   Aris Wright, daughter [phone] Contact Information     Name Relation Home Work Mobile   Wright,Shirley Daughter   [phone]   Wright, Shirley [phone]     Shirley, Wright [phone]         Visit is to build trust and highlight Palliative Medicine as specialized medical care for people living with serious illness, aimed at facilitating better quality of life through symptoms relief, assisting with advance care planning and complex medical decision making.  NP called Debbie to update half on the seat; left that a voicemail with callback number.  This is a follow up visit.  RECOMMENDATIONS/PLAN:   Advance Care Planning/Code Status: Patient remains a DO NOT RESUSCITATE.  Signed DNR in epic.  Goals of Care: Goals of care include to maximize quality of life and symptom management. Family is interested in hospice service when patient qualifies for it.  Palliative care team will continue to support patient, patient's family, and medical team.  Symptom management/Plan:  Neck pain:mild pain to right side of pain reported by Nurse Shirley Wright, relieved with Tylenol. Reposition for comfort and to avoid strain. Continue Tylenol 500 mg TID prn pain. Monitor closely and report unalleviated or worsening condition Dementia: Impoverished thought, difficulty finding words; ongoing memory loss and confusion in line with dementia disease trajectory. Patient is FAST C,  FLACC 0, not able to engage in meaningful discussion; limited language. Continue ongoing supportive  care.  Depression: Managed with Cymbalta, well-tolerated Insomnia: Managed with trazodone. Follow up: Palliative care will continue to follow for complex medical decision making, advance care planning, and clarification of goals. Return 6 weeks or prn. Encouraged to call provider sooner with any concerns.  CHIEF COMPLAINT: Palliative follow up  HISTORY OF PRESENT ILLNESS:  Shirley Wright a 88 y.o. female with multiple medical problems including Alzheimer dementia, depression, low back pain.  Shirley Wright reports patient complained of pain in right neck side, relieved with Tylenol 500 mg. Patient is content, denies pain/discomfort; nursing with no further concerns. No report of fall or hospitalization since last visit.  Collaborative discussion with facility SW who reports patient has perked up, seen self wheeling around the facility in her wheelchair. History obtained from review of EMR, discussion with primary team, family and/or patient. Records reviewed and summarized above. All 10 point systems reviewed and are negative except as documented in history of present illness above  Review and summarization of Epic records shows history from other than patient.   Palliative Care was asked to follow this patient o help address complex decision making in the context of advance care planning and goals of care clarification.   PHYSICAL EXAM  General: In no acute distress, appropriately dressed, FLACC 0 Cardiovascular: regular rate and rhythm; no edema in BLE Pulmonary: no cough, no increased work of breathing, normal respiratory effort Abdomen: soft, non tender, no guarding, positive bowel sounds in all quadrants GU:  no suprapubic tenderness Eyes: Normal lids, no discharge ENMT: Moist mucous membranes Musculoskeletal:  weakness, non ambulatory, no guarding, swelling or redness to neck Skin: no rash to visible  skin, warm without cyanosis,  Psych: coping, non-anxious affect Neurological: Weakness but  otherwise non focal Heme/lymph/immuno: no bruises, no bleeding  PERTINENT MEDICATIONS:  Outpatient Encounter Medications as of 06/20/2021  Medication Sig   acetaminophen (TYLENOL) 325 MG tablet Take 325 mg by mouth See admin instructions. Take 325 mg by mouth three times a day and an additional 650 mg up to three times a day as needed for back pain   cholecalciferol (VITAMIN D3) 25 MCG (1000 UT) tablet Take 1,000 Units by mouth daily.   DULoxetine (CYMBALTA) 30 MG capsule Take 30-60 mg by mouth See admin instructions. Take 30 mg by mouth once a day for 2 weeks, then increase to 2 capsules (60 mg) once a day   ferrous sulfate 325 (65 FE) MG tablet Take 325 mg by mouth daily with breakfast.   metoprolol succinate (TOPROL-XL) 25 MG 24 hr tablet Take 0.5 tablets (12.5 mg total) by mouth daily. Take with or immediately following a meal.   NON FORMULARY Take 120 mLs by mouth See admin instructions. MedPass: Drink 120 ml's by mouth 2 times a day   potassium chloride (KLOR-CON 10) 10 MEQ tablet Take 1 tablet (10 mEq total) by mouth daily.   traMADol (ULTRAM) 50 MG tablet Take 1 tablet (50 mg total) by mouth every 6 (six) hours as needed (for pain).   No facility-administered encounter medications on file as of 06/20/2021.    HOSPICE ELIGIBILITY/DIAGNOSIS: TBD  PAST MEDICAL HISTORY:  Past Medical History:  Diagnosis Date   Adenomatous colon polyp    tubular   Allergic rhinitis    Diverticulosis    GERD (gastroesophageal reflux disease)    Hemorrhoids    Hepatic cyst    History of colonic polyps    Hyperlipidemia    Hypertension    IBS (irritable bowel syndrome)    Osteoarthritis      ALLERGIES:  Allergies  Allergen Reactions   Penicillins Other (See Comments)    "Allergic," per [facility] Did it involve swelling of the face/tongue/throat, SOB, or low BP? Unk Did it involve sudden or severe rash/hives, skin peeling, or any reaction on the inside of your mouth or nose? Unk Did you need to  seek medical attention at a hospital or doctor's office? Unk When did it last happen? Unk If all above answers are "NO", may proceed with cephalosporin use.    Pneumococcal Vaccine Polyvalent Other (See Comments)    "Allergic," per [facility]   Tetanus-Diphtheria Toxoids Td Other (See Comments)    "Allergic," per [facility]      I spent 45 minutes providing this consultation; this includes time spent with patient/family, chart review and documentation. More than 50% of the time in this consultation was spent on counseling and coordinating communication   Thank you for the opportunity to participate in the care of [place] Please call our office at [phone] if we can be of additional assistance.  Note: Portions of this note were generated with Lobbyist. Dictation errors may occur despite best attempts at proofreading.  Teodoro Spray, NP

## 2021-06-21 DIAGNOSIS — M542 Cervicalgia: Secondary | ICD-10-CM | POA: Diagnosis not present

## 2021-07-28 ENCOUNTER — Other Ambulatory Visit: Payer: Self-pay

## 2021-07-28 ENCOUNTER — Non-Acute Institutional Stay: Payer: Medicare Other | Admitting: Hospice

## 2021-07-28 DIAGNOSIS — Z515 Encounter for palliative care: Secondary | ICD-10-CM

## 2021-07-28 DIAGNOSIS — M542 Cervicalgia: Secondary | ICD-10-CM | POA: Diagnosis not present

## 2021-07-28 DIAGNOSIS — F0390 Unspecified dementia without behavioral disturbance: Secondary | ICD-10-CM

## 2021-07-28 DIAGNOSIS — F339 Major depressive disorder, recurrent, unspecified: Secondary | ICD-10-CM

## 2021-07-28 DIAGNOSIS — G4700 Insomnia, unspecified: Secondary | ICD-10-CM

## 2021-07-28 NOTE — Progress Notes (Signed)
? ? ?Manufacturing [facility] ?[facility] Consult Note ?Telephone: [phone]  ?Fax: [phone] ? ?PATIENT NAME: Shirley Wright ?DOB: Dec 08, 1925 ?MRN: [id number] ? ?PRIMARY CARE PROVIDER:   Laurey Morale, MD ?Laurey Morale, MD ?[address] ?[place],  [place] [address] ? ?REFERRING PROVIDER: Laurey Morale, MD ?Laurey Morale, MD ?[address] ?[place],  [place] [address] ? ?RESPONSIBLE PARTY:[name]   ?[name], daughter [phone] ?Contact Information   ? ? Name Relation Home Work Mobile  ? Pace,Debra Daughter   [phone]  ? Anetra, Czerwinski Son [phone]    ? Jimi, Giza Son [phone]    ? ?  ? ? ?Visit is to build trust and highlight Palliative Medicine as specialized medical care for people living with serious illness, aimed at facilitating better quality of life through symptoms relief, assisting with advance care planning and complex medical decision making.  NP called Debbie to update half on the seat; left that a voicemail with callback number.  This is a follow up visit. ? ?RECOMMENDATIONS/PLAN:  ? ?Advance Care Planning/Code Status: Patient remains a DO NOT RESUSCITATE.  Signed DNR in epic. ? ?Goals of Care: Goals of care include to maximize quality of life and symptom management. ?Family is interested in hospice service when patient qualifies for it.  ?Palliative care team will continue to support patient, patient's family, and medical team. ? ?Symptom management/Plan:  ?Neck pain: Continue to reposition for comfort and to avoid strain. Continue Tylenol 500 mg TID prn pain. Monitor closely and report unalleviated or worsening condition ?Dementia: Impoverished thought, difficulty finding words; ongoing memory loss and confusion in line with dementia disease trajectory. Patient is FAST b  FLACC 0, not able to engage in meaningful discussion; optimize ongoing supportive care.  ?Depression: Managed with Cymbalta.  Encourage socialization and participation  in facility activities. ?Insomnia: Managed with trazodone.  Maintain bedtime routine ?Follow up: Palliative care will continue to follow for complex medical decision making, advance care planning, and clarification of goals. Return 6 weeks or prn. Encouraged to call provider sooner with any concerns. ? ?CHIEF COMPLAINT: Palliative follow up ? ?HISTORY OF PRESENT ILLNESS:  Shirley Wright a 88 y.o. female with multiple medical problems including Alzheimer dementia, depression, hypertension. Patient is content, denies pain/discomfort; nursing with no further concerns. No report of fall or hospitalization since last visit. History obtained from review of EMR, discussion with primary team, family and/or patient. Records reviewed and summarized above. All 10 point systems reviewed and are negative except as documented in history of present illness above ? ?Review and summarization of Epic records shows history from other than patient.  ? ?Palliative Care was asked to follow this patient o help address complex decision making in the context of advance care planning and goals of care clarification.  ? ?PHYSICAL EXAM  ?General: In no acute distress, appropriately dressed, sitting up watching TV, FLACC 0 ?Cardiovascular: regular rate and rhythm; no edema in BLE ?Pulmonary: no cough, no increased work of breathing, normal respiratory effort ?Abdomen: soft, non tender, no guarding, positive bowel sounds in all quadrants ?GU:  no suprapubic tenderness ?Eyes: Normal lids, no discharge ?ENMT: Moist mucous membranes ?Musculoskeletal:  weakness, non ambulatory, no guarding, swelling or redness to neck ?Skin: no rash to visible skin, warm without cyanosis,  ?Psych: coping, non-anxious affect ?Neurological: Weakness but otherwise non focal ?Heme/lymph/immuno: no bruises, no bleeding ? ?PERTINENT MEDICATIONS:  ?Outpatient Encounter Medications as of 07/28/2021  ?Medication Sig  ? acetaminophen (TYLENOL)  325 MG tablet Take 325 mg by  mouth See admin instructions. Take 325 mg by mouth three times a day and an additional 650 mg up to three times a day as needed for back pain  ? cholecalciferol (VITAMIN D3) 25 MCG (1000 UT) tablet Take 1,000 Units by mouth daily.  ? DULoxetine (CYMBALTA) 30 MG capsule Take 30-60 mg by mouth See admin instructions. Take 30 mg by mouth once a day for 2 weeks, then increase to 2 capsules (60 mg) once a day  ? ferrous sulfate 325 (65 FE) MG tablet Take 325 mg by mouth daily with breakfast.  ? metoprolol succinate (TOPROL-XL) 25 MG 24 hr tablet Take 0.5 tablets (12.5 mg total) by mouth daily. Take with or immediately following a meal.  ? NON FORMULARY Take 120 mLs by mouth See admin instructions. MedPass: Drink 120 ml's by mouth 2 times a day  ? potassium chloride (KLOR-CON 10) 10 MEQ tablet Take 1 tablet (10 mEq total) by mouth daily.  ? traMADol (ULTRAM) 50 MG tablet Take 1 tablet (50 mg total) by mouth every 6 (six) hours as needed (for pain).  ? ?No facility-administered encounter medications on file as of 07/28/2021.  ? ? ?HOSPICE ELIGIBILITY/DIAGNOSIS: TBD ? ?PAST MEDICAL HISTORY:  ?Past Medical History:  ?Diagnosis Date  ? Adenomatous colon polyp   ? tubular  ? Allergic rhinitis   ? Diverticulosis   ? GERD (gastroesophageal reflux disease)   ? Hemorrhoids   ? Hepatic cyst   ? History of colonic polyps   ? Hyperlipidemia   ? Hypertension   ? IBS (irritable bowel syndrome)   ? Osteoarthritis   ?  ? ?ALLERGIES:  ?Allergies  ?Allergen Reactions  ? Penicillins Other (See Comments)  ?  "Allergic," per MAR ?Did it involve swelling of the face/tongue/throat, SOB, or low BP? Unk ?Did it involve sudden or severe rash/hives, skin peeling, or any reaction on the inside of your mouth or nose? Unk ?Did you need to seek medical attention at a hospital or doctor's office? Unk ?When did it last happen? Unk ?If all above answers are "NO", may proceed with cephalosporin use. ?  ? Pneumococcal Vaccine Polyvalent Other (See Comments)   ?  "Allergic," per MAR  ? Tetanus-Diphtheria Toxoids Td Other (See Comments)  ?  "Allergic," per MAR  ?   ? ?I spent 45 minutes providing this consultation; this includes time spent with patient/family, chart review and documentation. More than 50% of the time in this consultation was spent on counseling and coordinating communication  ? ?Thank you for the opportunity to participate in the care of [place] Please call our office at [phone] if we can be of additional assistance. ? ?Note: Portions of this note were generated with Lobbyist. Dictation errors may occur despite best attempts at proofreading. ? ?Teodoro Spray, NP ? ?  ?

## 2021-10-10 ENCOUNTER — Non-Acute Institutional Stay: Payer: Medicare Other | Admitting: Hospice

## 2021-10-10 DIAGNOSIS — Z515 Encounter for palliative care: Secondary | ICD-10-CM | POA: Diagnosis not present

## 2021-10-10 DIAGNOSIS — G4700 Insomnia, unspecified: Secondary | ICD-10-CM

## 2021-10-10 DIAGNOSIS — F339 Major depressive disorder, recurrent, unspecified: Secondary | ICD-10-CM

## 2021-10-10 DIAGNOSIS — F0390 Unspecified dementia without behavioral disturbance: Secondary | ICD-10-CM

## 2021-10-10 NOTE — Progress Notes (Signed)
[facility] Palliative Care Consult Note Telephone: [phone]  Fax: [phone]  PATIENT NAME: Shirley Wright DOB: 1925-12-05 MRN: [id number]  PRIMARY CARE PROVIDER:   Laurey Morale, MD Laurey Morale, MD [place],  [place] [address]  REFERRING PROVIDER: Laurey Morale, MD Laurey Morale, MD [place],  [place] [address]  RESPONSIBLE PARTY:[name]   [name], daughter [phone] Contact Information     Name Relation Home Work Mobile   Pace,Debra Daughter   [phone]   Alizabeth, Antonio [phone]     Latresa, Gasser [phone]         Visit is to build trust and highlight Palliative Medicine as specialized medical care for people living with serious illness, aimed at facilitating better quality of life through symptoms relief, assisting with advance care planning and complex medical decision making.  Granddaughter Asencion Partridge and great grand daughter with her with patient during visit.   RECOMMENDATIONS/PLAN:   Advance Care Planning/Code Status: Patient remains a DO NOT RESUSCITATE.  Signed DNR in epic.  Goals of Care: Goals of care include to maximize quality of life and symptom management. Family is interested in hospice service when patient qualifies for it.  Palliative care team will continue to support patient, patient's family, and medical team.  Symptom management/Plan:  Dementia: Ongoing memory loss and confusion at baseline.  Fast 7A impoverished thought, difficulty finding words; continue to optimize ongoing supportive care.  Fall and safety precautions. Depression: Stable.  Managed with Cymbalta.  Encourage socialization and participation in facility activities. Insomnia: Managed with trazodone.  Maintain bedtime routine Follow up: Palliative care will continue to follow for complex medical decision making, advance care planning, and clarification of goals. Return 6 weeks or prn.  Encouraged to call provider sooner with any concerns.  CHIEF COMPLAINT: Palliative follow up  HISTORY OF PRESENT ILLNESS:  Shirley Wright a 88 y.o. female with multiple medical problems including Alzheimer dementia, depression, hypertension. Patient is content, denies pain/discomfort; nursing with no further concerns. No report of fall or hospitalization since last visit. History obtained from review of EMR, discussion with primary team, family and/or patient. Records reviewed and summarized above. All 10 point systems reviewed and are negative except as documented in history of present illness above  Review and summarization of Epic records shows history from other than patient.   Palliative Care was asked to follow this patient o help address complex decision making in the context of advance care planning and goals of care clarification.   PHYSICAL EXAM  General: In no acute distress, appropriately dressed, sitting up watching TV with grand daughter and great grand daughter, FLACC 0 Cardiovascular: regular rate and rhythm; no edema in BLE Pulmonary: no cough, no increased work of breathing, normal respiratory effort Abdomen: soft, non tender, no guarding, positive bowel sounds in all quadrants GU:  no suprapubic tenderness Eyes: Normal lids, no discharge ENMT: Moist mucous membranes Musculoskeletal:  weakness, non ambulatory Skin: no rash to visible skin, warm without cyanosis,  Psych: coping, non-anxious affect Neurological: Weakness but otherwise non focal Heme/lymph/immuno: no bruises, no bleeding  PERTINENT MEDICATIONS:  Outpatient Encounter Medications as of 10/10/2021  Medication Sig   acetaminophen (TYLENOL) 325 MG tablet Take 325 mg by mouth See admin instructions. Take 325 mg by mouth three times a day and an additional 650 mg up to three times a day as needed for back pain   cholecalciferol (VITAMIN D3) 25 MCG (  1000 UT) tablet Take 1,000 Units by mouth daily.   DULoxetine  (CYMBALTA) 30 MG capsule Take 30-60 mg by mouth See admin instructions. Take 30 mg by mouth once a day for 2 weeks, then increase to 2 capsules (60 mg) once a day   ferrous sulfate 325 (65 FE) MG tablet Take 325 mg by mouth daily with breakfast.   metoprolol succinate (TOPROL-XL) 25 MG 24 hr tablet Take 0.5 tablets (12.5 mg total) by mouth daily. Take with or immediately following a meal.   NON FORMULARY Take 120 mLs by mouth See admin instructions. MedPass: Drink 120 ml's by mouth 2 times a day   potassium chloride (KLOR-CON 10) 10 MEQ tablet Take 1 tablet (10 mEq total) by mouth daily.   traMADol (ULTRAM) 50 MG tablet Take 1 tablet (50 mg total) by mouth every 6 (six) hours as needed (for pain).   No facility-administered encounter medications on file as of 10/10/2021.    HOSPICE ELIGIBILITY/DIAGNOSIS: TBD  PAST MEDICAL HISTORY:  Past Medical History:  Diagnosis Date   Adenomatous colon polyp    tubular   Allergic rhinitis    Diverticulosis    GERD (gastroesophageal reflux disease)    Hemorrhoids    Hepatic cyst    History of colonic polyps    Hyperlipidemia    Hypertension    IBS (irritable bowel syndrome)    Osteoarthritis      ALLERGIES:  Allergies  Allergen Reactions   Penicillins Other (See Comments)    "Allergic," per [facility] Did it involve swelling of the face/tongue/throat, SOB, or low BP? Unk Did it involve sudden or severe rash/hives, skin peeling, or any reaction on the inside of your mouth or nose? Unk Did you need to seek medical attention at a hospital or doctor's office? Unk When did it last happen? Unk If all above answers are "NO", may proceed with cephalosporin use.    Pneumococcal Vaccine Polyvalent Other (See Comments)    "Allergic," per MAR   Tetanus-Diphtheria Toxoids Td Other (See Comments)    "Allergic," per MAR      I spent 45 minutes providing this consultation; this includes time spent with patient/family, chart review and documentation. More than  50% of the time in this consultation was spent on counseling and coordinating communication   Thank you for the opportunity to participate in the care of [place] Please call our office at [phone] if we can be of additional assistance.  Note: Portions of this note were generated with Lobbyist. Dictation errors may occur despite best attempts at proofreading.  Teodoro Spray, NP

## 2021-10-27 ENCOUNTER — Non-Acute Institutional Stay: Payer: Medicare Other | Admitting: Hospice

## 2021-10-27 DIAGNOSIS — Z515 Encounter for palliative care: Secondary | ICD-10-CM | POA: Diagnosis not present

## 2021-10-27 DIAGNOSIS — G4700 Insomnia, unspecified: Secondary | ICD-10-CM

## 2021-10-27 DIAGNOSIS — F339 Major depressive disorder, recurrent, unspecified: Secondary | ICD-10-CM

## 2021-10-27 DIAGNOSIS — F0390 Unspecified dementia without behavioral disturbance: Secondary | ICD-10-CM

## 2021-10-27 NOTE — Progress Notes (Signed)
[facility] Consult Note Telephone: [phone]  Fax: [phone]  PATIENT NAME: Shirley Wright DOB: 1925-09-11 MRN: [id number]  PRIMARY CARE PROVIDER:   Laurey Morale, MD Shirley Morale, MD [place],  [place] [address]  REFERRING PROVIDER: Laurey Morale, MD Shirley Morale, MD [place],  [place] [address]  RESPONSIBLE PARTY:Shirley Wright [phone] Shirley Wright, daughter [phone] Contact Information     Name Relation Home Work Mobile   Wright,Shirley Daughter   [phone]   Shirley, Wright [phone]     Shirley, Wright [phone]         Visit is to build trust and highlight Palliative Medicine as specialized medical care for people living with serious illness, aimed at facilitating better quality of life through symptoms relief, assisting with advance care planning and complex medical decision making.  Face-to-face meeting with client and patient and patient's room. RECOMMENDATIONS/PLAN:   Advance Care Planning/Code Status: Patient remains a DO NOT RESUSCITATE.  Signed DNR in epic.  Goals of Care: Goals of care include to maximize quality of life and symptom management.   Discussion on patient's incremental decline in functional status related to progression of dementia disease.  Discussion to de-escalate disease focused treatments due to poor prognosis.  Discussion on hospice service as patient continues to progress in dementia disease trajectory. Shirley Wright affirmed that family is interested in hospice service when patient qualifies for it.  No feeding tube.  Shirley Wright request follow-up visit 11/24/2021 to further discuss goals of care clarification using MOST form. Palliative care team will continue to support patient, patient's family, and medical team.  Symptom management/Plan:  Dementia: Progressive decline in functional status, memory loss and confusion.  Functional assessment  staging tool for dementia early last year was FAST 6 D, currently FAST 7D, nonambulatory, impoverished thought, difficulty finding words; continue to optimize ongoing supportive care.  Fall and safety precautions. Depression: Stable.  Managed with Cymbalta.  Encourage socialization and participation in facility activities.  Patient enjoys looking at pictures in books and magazines.  Encourage participation in activities of choice. Insomnia: Managed with trazodone.  Maintain bedtime routine Follow up: Palliative care will continue to follow for complex medical decision making, advance care planning, and clarification of goals. Return 6 weeks or prn. Encouraged to call provider sooner with any concerns.  CHIEF COMPLAINT: Palliative follow up  HISTORY OF PRESENT ILLNESS:  Shirley Wright a 87 y.o. female with multiple medical problems including Alzheimer dementia, depression, hypertension. Patient is content, denies pain/discomfort; No report of fall or hospitalization since last visit. History obtained from review of EMR, discussion with primary team, family and/or patient. Records reviewed and summarized above. All 10 point systems reviewed and are negative except as documented in history of present illness above  Review and summarization of Epic records shows history from other than patient.   Palliative Care was asked to follow this patient o help address complex decision making in the context of advance care planning and goals of care clarification.   PHYSICAL EXAM  General: In no acute distress, appropriately dressed, sitting up looking at pictures in a magazine, FLACC 0 Cardiovascular: regular rate and rhythm; no edema in BLE Pulmonary: no cough, no increased work of breathing, normal respiratory effort Abdomen: soft, non tender, no guarding, positive bowel sounds in all quadrants GU:  no suprapubic tenderness Eyes: Normal lids, no discharge ENMT: Moist mucous membranes Musculoskeletal:   weakness, non ambulatory Skin:  Warm and dry, fragile skin that bruises easily. Psych: coping, non-anxious affect Neurological: Weakness but otherwise non focal, memory loss/confusion Heme/lymph/immuno: no bruises, no bleeding  PERTINENT MEDICATIONS:  Outpatient Encounter Medications as of 10/27/2021  Medication Sig   acetaminophen (TYLENOL) 325 MG tablet Take 325 mg by mouth See admin instructions. Take 325 mg by mouth three times a day and an additional 650 mg up to three times a day as needed for back pain   cholecalciferol (VITAMIN D3) 25 MCG (1000 UT) tablet Take 1,000 Units by mouth daily.   DULoxetine (CYMBALTA) 30 MG capsule Take 30-60 mg by mouth See admin instructions. Take 30 mg by mouth once a day for 2 weeks, then increase to 2 capsules (60 mg) once a day   ferrous sulfate 325 (65 FE) MG tablet Take 325 mg by mouth daily with breakfast.   metoprolol succinate (TOPROL-XL) 25 MG 24 hr tablet Take 0.5 tablets (12.5 mg total) by mouth daily. Take with or immediately following a meal.   NON FORMULARY Take 120 mLs by mouth See admin instructions. MedPass: Drink 120 ml's by mouth 2 times a day   potassium chloride (KLOR-CON 10) 10 MEQ tablet Take 1 tablet (10 mEq total) by mouth daily.   traMADol (ULTRAM) 50 MG tablet Take 1 tablet (50 mg total) by mouth every 6 (six) hours as needed (for pain).   No facility-administered encounter medications on file as of 10/27/2021.    HOSPICE ELIGIBILITY/DIAGNOSIS: TBD  PAST MEDICAL HISTORY:  Past Medical History:  Diagnosis Date   Adenomatous colon polyp    tubular   Allergic rhinitis    Diverticulosis    GERD (gastroesophageal reflux disease)    Hemorrhoids    Hepatic cyst    History of colonic polyps    Hyperlipidemia    Hypertension    IBS (irritable bowel syndrome)    Osteoarthritis      ALLERGIES:  Allergies  Allergen Reactions   Penicillins Other (See Comments)    "Allergic," per [facility] Did it involve swelling of the  face/tongue/throat, SOB, or low BP? Unk Did it involve sudden or severe rash/hives, skin peeling, or any reaction on the inside of your mouth or nose? Unk Did you need to seek medical attention at a hospital or doctor's office? Unk When did it last happen? Unk If all above answers are "NO", may proceed with cephalosporin use.    Pneumococcal Vaccine Polyvalent Other (See Comments)    "Allergic," per MAR   Tetanus-Diphtheria Toxoids Td Other (See Comments)    "Allergic," per MAR      I spent 60 minutes providing this consultation; this includes time spent with patient/family, chart review and documentation. More than 50% of the time in this consultation was spent on counseling and coordinating communication   Thank you for the opportunity to participate in the care of [place] Please call our office at [phone] if we can be of additional assistance.  Note: Portions of this note were generated with Lobbyist. Dictation errors may occur despite best attempts at proofreading.  Teodoro Spray, NP

## 2021-10-28 DIAGNOSIS — Z79899 Other long term (current) drug therapy: Secondary | ICD-10-CM | POA: Diagnosis not present

## 2021-11-24 ENCOUNTER — Non-Acute Institutional Stay: Payer: Medicare Other | Admitting: Hospice

## 2021-11-24 DIAGNOSIS — F339 Major depressive disorder, recurrent, unspecified: Secondary | ICD-10-CM

## 2021-11-24 DIAGNOSIS — Z515 Encounter for palliative care: Secondary | ICD-10-CM | POA: Diagnosis not present

## 2021-11-24 DIAGNOSIS — G4700 Insomnia, unspecified: Secondary | ICD-10-CM

## 2021-11-24 DIAGNOSIS — F0390 Unspecified dementia without behavioral disturbance: Secondary | ICD-10-CM

## 2021-11-24 NOTE — Progress Notes (Signed)
[facility] Consult Note Telephone: [phone]  Fax: [phone]  PATIENT NAME: Shirley Wright DOB: April 01, 1926 MRN: [id number]  PRIMARY CARE PROVIDER:   Laurey Morale, MD Laurey Morale, MD [place],  [place] [address]  REFERRING PROVIDER: Laurey Morale, MD Laurey Morale, MD [place],  [place] [address]  RESPONSIBLE PARTY:[name] [id number] [name], daughter [phone] Contact Information     Name Relation Home Work Mobile   Pace,Debra Daughter   [phone]   Alean, Kromer [phone]     Avagrace, Botelho [phone]         Visit is to build trust and highlight Palliative Medicine as specialized medical care for people living with serious illness, aimed at facilitating better quality of life through symptoms relief, assisting with advance care planning and complex medical decision making.  Face-to-face meeting with patient. [name] and [name] are present during visit.  RECOMMENDATIONS/PLAN:   Advance Care Planning/Code Status: Patient remains a DO NOT RESUSCITATE.  Signed DNR in epic.  Goals of Care: Goals of care include to maximize quality of life and symptom management. Meeting with [place] on discussion further clarification of goals of care using MOST form.  MOST selections include comfort measures, no feeding tube antibiotics determined when infection occurs, IV fluids for a defined trial.  Signed MOST form uploaded to epic; same document handed  uploaded to Epic and original copy given to facility nurse [name] for facility records.  Discussion on patient's incremental decline in functional status related to progression of dementia disease.  Discussion to de-escalate disease focused treatments due to poor prognosis.   [name] affirmed that family is interested in hospice service when patient qualifies for it.    Visit consisted of counseling and education  dealing with the complex and emotionally intense issues of symptom management and palliative care in the setting of serious and potentially life-threatening illness. Palliative care team will continue to support patient, patient's family, and medical team. I spent 25 minutes providing this consultation. More than 50% of the time in this consultation was spent on counseling patient and coordinating communication. --------------------------------------------------------------------------------------------------------------------------------------    Symptom management/Plan:  Family elects Comfort care for patient today.  Dementia: Continue ongoing supportive care. Progressive decline in functional status, memory loss and confusion.  Functional assessment staging tool for dementia early last year was FAST 6 D, currently FAST 7D, nonambulatory, impoverished thought, difficulty finding words; continue to optimize ongoing supportive care.  Fall and safety precautions. Depression: Stable.  Managed with Cymbalta.  Encourage socialization and participation in facility activities.  Patient enjoys watching birds through her window, looking at pictures in books and magazines.  Encourage participation in activities of choice. Insomnia: Managed with trazodone.  Continue bedtime routine. Routine CBC CMP. Follow up: Palliative care will continue to follow for complex medical decision making, advance care planning, and clarification of goals. Return 6 weeks or prn. Encouraged to call provider sooner with any concerns.  CHIEF COMPLAINT: Palliative follow up  HISTORY OF PRESENT ILLNESS:  Shirley Wright a 88 y.o. female with multiple medical problems including Alzheimer dementia, depression, hypertension. Patient is content, denies pain/discomfort; No report of fall or hospitalization since last visit. History obtained from review of EMR, discussion with primary team, family and/or patient. Records reviewed and  summarized above. All 10 point systems reviewed and are negative except as documented in history of present illness above Review and summarization of Epic  records shows history from other than patient.  Independent interpretation of tests and reviewed as needed, available labs, patient records, imaging, studies and related documents from the EMR. Palliative Care was asked to follow this patient o help address complex decision making in the context of advance care planning and goals of care clarification.    PERTINENT MEDICATIONS:  Outpatient Encounter Medications as of 11/24/2021  Medication Sig   acetaminophen (TYLENOL) 325 MG tablet Take 325 mg by mouth See admin instructions. Take 325 mg by mouth three times a day and an additional 650 mg up to three times a day as needed for back pain   cholecalciferol (VITAMIN D3) 25 MCG (1000 UT) tablet Take 1,000 Units by mouth daily.   DULoxetine (CYMBALTA) 30 MG capsule Take 30-60 mg by mouth See admin instructions. Take 30 mg by mouth once a day for 2 weeks, then increase to 2 capsules (60 mg) once a day   ferrous sulfate 325 (65 FE) MG tablet Take 325 mg by mouth daily with breakfast.   metoprolol succinate (TOPROL-XL) 25 MG 24 hr tablet Take 0.5 tablets (12.5 mg total) by mouth daily. Take with or immediately following a meal.   NON FORMULARY Take 120 mLs by mouth See admin instructions. MedPass: Drink 120 ml's by mouth 2 times a day   potassium chloride (KLOR-CON 10) 10 MEQ tablet Take 1 tablet (10 mEq total) by mouth daily.   traMADol (ULTRAM) 50 MG tablet Take 1 tablet (50 mg total) by mouth every 6 (six) hours as needed (for pain).   No facility-administered encounter medications on file as of 11/24/2021.    HOSPICE ELIGIBILITY/DIAGNOSIS: TBD  PAST MEDICAL HISTORY:  Past Medical History:  Diagnosis Date   Adenomatous colon polyp    tubular   Allergic rhinitis    Diverticulosis    GERD (gastroesophageal reflux disease)    Hemorrhoids     Hepatic cyst    History of colonic polyps    Hyperlipidemia    Hypertension    IBS (irritable bowel syndrome)    Osteoarthritis      ALLERGIES:  Allergies  Allergen Reactions   Penicillins Other (See Comments)    "Allergic," per [facility] Did it involve swelling of the face/tongue/throat, SOB, or low BP? Unk Did it involve sudden or severe rash/hives, skin peeling, or any reaction on the inside of your mouth or nose? Unk Did you need to seek medical attention at a hospital or doctor's office? Unk When did it last happen? Unk If all above answers are "NO", may proceed with cephalosporin use.    Pneumococcal Vaccine Polyvalent Other (See Comments)    "Allergic," per [facility]   Tetanus-Diphtheria Toxoids Td Other (See Comments)    "Allergic," per [facility]        Thank you for the opportunity to participate in the care of [place] Please call our office at [phone] if we can be of additional assistance.  Note: Portions of this note were generated with Lobbyist. Dictation errors may occur despite best attempts at proofreading.  Teodoro Spray, NP

## 2021-12-19 ENCOUNTER — Non-Acute Institutional Stay: Payer: Medicare Other | Admitting: Hospice

## 2021-12-19 DIAGNOSIS — F339 Major depressive disorder, recurrent, unspecified: Secondary | ICD-10-CM

## 2021-12-19 DIAGNOSIS — Z515 Encounter for palliative care: Secondary | ICD-10-CM

## 2021-12-19 DIAGNOSIS — G4700 Insomnia, unspecified: Secondary | ICD-10-CM

## 2021-12-19 DIAGNOSIS — F0390 Unspecified dementia without behavioral disturbance: Secondary | ICD-10-CM

## 2021-12-19 NOTE — Progress Notes (Signed)
[facility] Consult Note Telephone: [phone]  Fax: [phone]  PATIENT NAME: ANABELL SWINT DOB: [birth year] MRN: [id number]  PRIMARY CARE PROVIDER:   Laurey Morale, MD Laurey Morale, MD [place],  [place] [address]  REFERRING PROVIDER: Laurey Morale, MD Laurey Morale, MD [place],  [place] [address]  RESPONSIBLE PARTY:[name] [id number] [name], daughter [phone] Contact Information     Name Relation Home Work Mobile   Pace,Debra Daughter   [phone]   Donyea, Gafford [phone]     Sakura, Denis [phone]         Visit is to build trust and highlight Palliative Medicine as specialized medical care for people living with serious illness, aimed at facilitating better quality of life through symptoms relief, assisting with advance care planning and complex medical decision making.   RECOMMENDATIONS/PLAN:   Advance Care Planning/Code Status: Patient remains a DO NOT RESUSCITATE.    Goals of Care: Goals of care include to maximize quality of life and symptom management.  MOST selections include comfort measures, no feeding tube antibiotics determined when infection occurs, IV fluids for a defined trial.   Family is interested in hospice service when patient qualifies for it.    Visit consisted of counseling and education dealing with the complex and emotionally intense issues of symptom management and palliative care in the setting of serious and potentially life-threatening illness. Palliative care team will continue to support patient, patient's family, and medical team.    Symptom management/Plan:  Comfort care only, per family request.   Dementia:  FAST 7D, nonambulatory, impoverished thought, difficulty finding words; continue to optimize ongoing supportive care.  Fall and safety precautions. Depression: Continue  as orderedCymbalta.  Encourage  socialization and participation in facility activities.  Patient enjoys watching birds through her window, looking at pictures in books and magazines.  Encourage participation in activities of choice. Insomnia: Managed with trazodone.  Continue bedtime routine. Routine CBC BMP. Follow up: Palliative care will continue to follow for complex medical decision making, advance care planning, and clarification of goals. Return 6 weeks or prn. Encouraged to call provider sooner with any concerns.  CHIEF COMPLAINT: Palliative follow up  HISTORY OF PRESENT ILLNESS:  Anntionette B Sensing a 88 y.o. female with multiple medical problems including Alzheimer dementia, depression, hypertension. Patient is content, denies pain/discomfort; No report of fall or hospitalization since last visit. History obtained from review of EMR, discussion with primary team, family and/or patient. Records reviewed and summarized above. All 10 point systems reviewed and are negative except as documented in history of present illness above Review and summarization of Epic records shows history from other than patient.  I reviewed as needed, available labs, patient records, imaging, studies and related documents from the EMR. Palliative Care was asked to follow this patient o help address complex decision making in the context of advance care planning and goals of care clarification.    PERTINENT MEDICATIONS:  Outpatient Encounter Medications as of 12/19/2021  Medication Sig   acetaminophen (TYLENOL) 325 MG tablet Take 325 mg by mouth See admin instructions. Take 325 mg by mouth three times a day and an additional 650 mg up to three times a day as needed for back pain   cholecalciferol (VITAMIN D3) 25 MCG (1000 UT) tablet Take 1,000 Units by mouth daily.   DULoxetine (CYMBALTA) 30 MG capsule Take 30-60 mg by mouth See admin instructions. Take 30  mg by mouth once a day for 2 weeks, then increase to 2 capsules (60 mg) once a day   ferrous  sulfate 325 (65 FE) MG tablet Take 325 mg by mouth daily with breakfast.   metoprolol succinate (TOPROL-XL) 25 MG 24 hr tablet Take 0.5 tablets (12.5 mg total) by mouth daily. Take with or immediately following a meal.   NON FORMULARY Take 120 mLs by mouth See admin instructions. MedPass: Drink 120 ml's by mouth 2 times a day   potassium chloride (KLOR-CON 10) 10 MEQ tablet Take 1 tablet (10 mEq total) by mouth daily.   traMADol (ULTRAM) 50 MG tablet Take 1 tablet (50 mg total) by mouth every 6 (six) hours as needed (for pain).   No facility-administered encounter medications on file as of 12/19/2021.    HOSPICE ELIGIBILITY/DIAGNOSIS: TBD  PAST MEDICAL HISTORY:  Past Medical History:  Diagnosis Date   Adenomatous colon polyp    tubular   Allergic rhinitis    Diverticulosis    GERD (gastroesophageal reflux disease)    Hemorrhoids    Hepatic cyst    History of colonic polyps    Hyperlipidemia    Hypertension    IBS (irritable bowel syndrome)    Osteoarthritis      ALLERGIES:  Allergies  Allergen Reactions   Penicillins Other (See Comments)    "Allergic," per [facility] Did it involve swelling of the face/tongue/throat, SOB, or low BP? Unk Did it involve sudden or severe rash/hives, skin peeling, or any reaction on the inside of your mouth or nose? Unk Did you need to seek medical attention at a hospital or doctor's office? Unk When did it last happen? Unk If all above answers are "NO", may proceed with cephalosporin use.    Pneumococcal Vaccine Polyvalent Other (See Comments)    "Allergic," per [facility]   Tetanus-Diphtheria Toxoids Td Other (See Comments)    "Allergic," per [facility]      I spent 45 minutes providing this consultation; time includes spent with patient/family, chart review and documentation. More than 50% of the time in this consultation was spent on care coordination  Thank you for the opportunity to participate in the care of [place] Please call our office at  [phone] if we can be of additional assistance.  Note: Portions of this note were generated with Lobbyist. Dictation errors may occur despite best attempts at proofreading.  Teodoro Spray, NP

## 2022-02-16 DIAGNOSIS — Z23 Encounter for immunization: Secondary | ICD-10-CM | POA: Diagnosis not present

## 2022-03-30 DIAGNOSIS — Z23 Encounter for immunization: Secondary | ICD-10-CM | POA: Diagnosis not present

## 2022-04-19 ENCOUNTER — Non-Acute Institutional Stay: Payer: Medicare Other | Admitting: Hospice

## 2022-04-19 DIAGNOSIS — G4700 Insomnia, unspecified: Secondary | ICD-10-CM | POA: Diagnosis not present

## 2022-04-19 DIAGNOSIS — F339 Major depressive disorder, recurrent, unspecified: Secondary | ICD-10-CM

## 2022-04-19 DIAGNOSIS — F0390 Unspecified dementia without behavioral disturbance: Secondary | ICD-10-CM

## 2022-04-19 DIAGNOSIS — Z515 Encounter for palliative care: Secondary | ICD-10-CM | POA: Diagnosis not present

## 2022-04-19 NOTE — Progress Notes (Signed)
[facility] Consult Note Telephone: [phone]  Fax: [phone]  PATIENT NAME: Shirley Wright DOB: 07-27-1925 MRN: [id number]  PRIMARY CARE PROVIDER:   Laurey Morale, MD Laurey Morale, MD [place],  [place] [address]  REFERRING PROVIDER: Laurey Morale, MD Laurey Morale, MD [place],  [place] [address]  RESPONSIBLE PARTY:[name] [id number] [name], daughter [phone] Contact Information     Name Relation Home Work Mobile   Pace,Debra Daughter   [phone]   Zeta, Bucy [phone]     Comfort, Iversen [phone]         Visit is to build trust and highlight Palliative Medicine as specialized medical care for people living with serious illness, aimed at facilitating better quality of life through symptoms relief, assisting with advance care planning and complex medical decision making. [name] is with patient during visit.   RECOMMENDATIONS/PLAN:   Advance Care Planning/Code Status: Patient remains a DO NOT RESUSCITATE.    Goals of Care: Goals of care include to maximize quality of life and symptom management.  MOST selections include comfort measures, no feeding tube antibiotics determined when infection occurs, IV fluids for a defined trial.   Family is interested in hospice service in the future.  Visit consisted of counseling and education dealing with the complex and emotionally intense issues of symptom management and palliative care in the setting of serious and potentially life-threatening illness. Palliative care team will continue to support patient, patient's family, and medical team.    Symptom management/Plan:  Comfort care only, per family request.   Dementia: without behavior. Worsening memory loss, FAST 7D, nonambulatory, impoverished thought, difficulty finding words; continue to optimize ongoing supportive care.  Fall and safety  precautions. Depression: Stable. Continue  as orderedCymbalta.  Encourage socialization and participation in facility activities.  Patient enjoys watching birds through her window, looking at pictures in magazine with .  Encourage participation in activities of choice. Insomnia: Managed with trazodone.  Continue bedtime routine. Routine CBC BMP. Follow up: Palliative care will continue to follow for complex medical decision making, advance care planning, and clarification of goals. Return 6 weeks or prn. Encouraged to call provider sooner with any concerns.  CHIEF COMPLAINT: Palliative follow up  HISTORY OF PRESENT ILLNESS:  Shirley Wright a 88 y.o. female with multiple medical problems including Alzheimer dementia, depression, hypertension. Patient is content, denies pain/discomfort; No report of fall or hospitalization since last visit. History obtained from review of EMR, discussion with primary team, family and/or patient. Records reviewed and summarized above. All 10 point systems reviewed and are negative except as documented in history of present illness above Review and summarization of Epic records shows history from other than patient.  I reviewed as needed, available labs, patient records, imaging, studies and related documents from the EMR. Palliative Care was asked to follow this patient o help address complex decision making in the context of advance care planning and goals of care clarification.    PERTINENT MEDICATIONS:  Outpatient Encounter Medications as of 04/19/2022  Medication Sig   acetaminophen (TYLENOL) 325 MG tablet Take 325 mg by mouth See admin instructions. Take 325 mg by mouth three times a day and an additional 650 mg up to three times a day as needed for back pain   cholecalciferol (VITAMIN D3) 25 MCG (1000 UT) tablet Take 1,000 Units by mouth daily.   DULoxetine (CYMBALTA) 30 MG capsule Take 30-60 mg  by mouth See admin instructions. Take 30 mg by mouth once a day  for 2 weeks, then increase to 2 capsules (60 mg) once a day   ferrous sulfate 325 (65 FE) MG tablet Take 325 mg by mouth daily with breakfast.   metoprolol succinate (TOPROL-XL) 25 MG 24 hr tablet Take 0.5 tablets (12.5 mg total) by mouth daily. Take with or immediately following a meal.   NON FORMULARY Take 120 mLs by mouth See admin instructions. MedPass: Drink 120 ml's by mouth 2 times a day   potassium chloride (KLOR-CON 10) 10 MEQ tablet Take 1 tablet (10 mEq total) by mouth daily.   traMADol (ULTRAM) 50 MG tablet Take 1 tablet (50 mg total) by mouth every 6 (six) hours as needed (for pain).   No facility-administered encounter medications on file as of 04/19/2022.    HOSPICE ELIGIBILITY/DIAGNOSIS: TBD  PAST MEDICAL HISTORY:  Past Medical History:  Diagnosis Date   Adenomatous colon polyp    tubular   Allergic rhinitis    Diverticulosis    GERD (gastroesophageal reflux disease)    Hemorrhoids    Hepatic cyst    History of colonic polyps    Hyperlipidemia    Hypertension    IBS (irritable bowel syndrome)    Osteoarthritis      ALLERGIES:  Allergies  Allergen Reactions   Penicillins Other (See Comments)    "Allergic," per [facility] Did it involve swelling of the face/tongue/throat, SOB, or low BP? Unk Did it involve sudden or severe rash/hives, skin peeling, or any reaction on the inside of your mouth or nose? Unk Did you need to seek medical attention at a hospital or doctor's office? Unk When did it last happen? Unk If all above answers are "NO", may proceed with cephalosporin use.    Pneumococcal Vaccine Polyvalent Other (See Comments)    "Allergic," per [facility]   Tetanus-Diphtheria Toxoids Td Other (See Comments)    "Allergic," per [facility]      I spent 35 minutes providing this consultation; time includes spent with patient/family, chart review and documentation. More than 50% of the time in this consultation was spent on care coordination  Thank you for the opportunity to  participate in the care of [place] Please call our office at [phone] if we can be of additional assistance.  Note: Portions of this note were generated with Lobbyist. Dictation errors may occur despite best attempts at proofreading.  Teodoro Spray, NP

## 2022-05-03 DIAGNOSIS — R1310 Dysphagia, unspecified: Secondary | ICD-10-CM | POA: Diagnosis not present

## 2022-06-19 DIAGNOSIS — I10 Essential (primary) hypertension: Secondary | ICD-10-CM | POA: Diagnosis not present

## 2022-06-19 DIAGNOSIS — H04123 Dry eye syndrome of bilateral lacrimal glands: Secondary | ICD-10-CM | POA: Diagnosis not present

## 2022-06-19 DIAGNOSIS — Z961 Presence of intraocular lens: Secondary | ICD-10-CM | POA: Diagnosis not present

## 2022-06-22 DIAGNOSIS — D649 Anemia, unspecified: Secondary | ICD-10-CM | POA: Diagnosis not present

## 2022-06-22 DIAGNOSIS — E559 Vitamin D deficiency, unspecified: Secondary | ICD-10-CM | POA: Diagnosis not present

## 2022-06-22 DIAGNOSIS — Z79899 Other long term (current) drug therapy: Secondary | ICD-10-CM | POA: Diagnosis not present

## 2022-06-24 DIAGNOSIS — Z86718 Personal history of other venous thrombosis and embolism: Secondary | ICD-10-CM | POA: Diagnosis not present

## 2022-06-24 DIAGNOSIS — I10 Essential (primary) hypertension: Secondary | ICD-10-CM | POA: Diagnosis not present

## 2022-06-24 DIAGNOSIS — K5900 Constipation, unspecified: Secondary | ICD-10-CM | POA: Diagnosis not present

## 2022-06-25 ENCOUNTER — Non-Acute Institutional Stay: Payer: Medicare Other | Admitting: Hospice

## 2022-06-25 DIAGNOSIS — F339 Major depressive disorder, recurrent, unspecified: Secondary | ICD-10-CM

## 2022-06-25 DIAGNOSIS — Z515 Encounter for palliative care: Secondary | ICD-10-CM

## 2022-06-25 DIAGNOSIS — K5901 Slow transit constipation: Secondary | ICD-10-CM

## 2022-06-25 DIAGNOSIS — F0390 Unspecified dementia without behavioral disturbance: Secondary | ICD-10-CM

## 2022-06-25 DIAGNOSIS — G8929 Other chronic pain: Secondary | ICD-10-CM | POA: Diagnosis not present

## 2022-06-25 DIAGNOSIS — M545 Low back pain, unspecified: Secondary | ICD-10-CM | POA: Diagnosis not present

## 2022-06-25 DIAGNOSIS — G4700 Insomnia, unspecified: Secondary | ICD-10-CM

## 2022-06-25 NOTE — Progress Notes (Signed)
[facility] Consult Note Telephone: [phone]  Fax: [phone]  PATIENT NAME: Shirley Wright DOB: 09-Jan-1926 MRN: [id number]  PRIMARY CARE PROVIDER:   Laurey Morale, MD Laurey Morale, MD [place],  [place] [address]  REFERRING PROVIDER: Laurey Morale, MD Laurey Morale, MD [place],  [place] [address]  RESPONSIBLE PARTY:[name] [id number] [name], daughter [phone] Contact Information     Name Relation Home Work Mobile   Pace,Debra Daughter   [phone]   Koreen, Lizaola [phone]     Aviya, Jarvie [phone]         Visit is to build trust and highlight Palliative Medicine as specialized medical care for people living with serious illness, aimed at facilitating better quality of life through symptoms relief, assisting with advance care planning and complex medical decision making.  NP called [name] and updated him on visit.  He expressed appreciation for the call.  RECOMMENDATIONS/PLAN:   Advance Care Planning/Code Status: Patient remains a DO NOT RESUSCITATE.    Goals of Care: Goals of care include to maximize quality of life and symptom management.  MOST selections include comfort measures, no feeding tube antibiotics determined when infection occurs, IV fluids for a defined trial.   Family is interested in hospice service in the future.  Visit consisted of counseling and education dealing with the complex and emotionally intense issues of symptom management and palliative care in the setting of serious and potentially life-threatening illness. Palliative care team will continue to support patient, patient's family, and medical team.    Symptom management/Plan:  Comfort care only, per family request.   Dementia: without behavior.  Memory loss/confusion at baseline, incontinent of bowel and bladder, max assist in ADLs. FAST 7A nonambulatory, impoverished  thought, difficulty finding words; continue to optimize ongoing supportive care.  Fall and safety precautions. Back pain: Continue Tylenol as ordered. Constipation: Continue senna S, lactulose as ordered.  Hold for diarrhea Depression: Stable. Continue Cymbalta.  Encourage socialization and participation in facility activities.  Patient enjoys watching birds through her window, looking at pictures in magazine; seen today in common area with other residents.  Encourage participation in activities of choice.  Insomnia: Managed with trazodone.  Continue bedtime routine. Routine CBC BMP.  Follow up: Palliative care will continue to follow for complex medical decision making, advance care planning, and clarification of goals. Return 6 weeks or prn. Encouraged to call provider sooner with any concerns.  CHIEF COMPLAINT: Palliative follow up  HISTORY OF PRESENT ILLNESS:  Brit B Amendola a 89 y.o. female with multiple medical problems including Alzheimer dementia, depression, hypertension. Patient is pleasant, content, denies pain/discomfort; FLACC 0. No report of fall or hospitalization since last visit. History obtained from review of EMR, discussion with primary team, family and/or patient. Records reviewed and summarized above. All 10 point systems reviewed and are negative except as documented in history of present illness above Review and summarization of Epic records shows history from other than patient.  I reviewed as needed, available labs, patient records, imaging, studies and related documents from the EMR. Palliative Care was asked to follow this patient o help address complex decision making in the context of advance care planning and goals of care clarification.    PERTINENT MEDICATIONS:  Outpatient Encounter Medications as of 06/25/2022  Medication Sig   acetaminophen (TYLENOL) 325 MG tablet Take 325 mg by mouth See admin instructions. Take 325 mg by mouth  three times a day and an  additional 650 mg up to three times a day as needed for back pain   cholecalciferol (VITAMIN D3) 25 MCG (1000 UT) tablet Take 1,000 Units by mouth daily.   DULoxetine (CYMBALTA) 30 MG capsule Take 30-60 mg by mouth See admin instructions. Take 30 mg by mouth once a day for 2 weeks, then increase to 2 capsules (60 mg) once a day   ferrous sulfate 325 (65 FE) MG tablet Take 325 mg by mouth daily with breakfast.   metoprolol succinate (TOPROL-XL) 25 MG 24 hr tablet Take 0.5 tablets (12.5 mg total) by mouth daily. Take with or immediately following a meal.   NON FORMULARY Take 120 mLs by mouth See admin instructions. MedPass: Drink 120 ml's by mouth 2 times a day   potassium chloride (KLOR-CON 10) 10 MEQ tablet Take 1 tablet (10 mEq total) by mouth daily.   traMADol (ULTRAM) 50 MG tablet Take 1 tablet (50 mg total) by mouth every 6 (six) hours as needed (for pain).   No facility-administered encounter medications on file as of 06/25/2022.    HOSPICE ELIGIBILITY/DIAGNOSIS: TBD  PAST MEDICAL HISTORY:  Past Medical History:  Diagnosis Date   Adenomatous colon polyp    tubular   Allergic rhinitis    Diverticulosis    GERD (gastroesophageal reflux disease)    Hemorrhoids    Hepatic cyst    History of colonic polyps    Hyperlipidemia    Hypertension    IBS (irritable bowel syndrome)    Osteoarthritis      ALLERGIES:  Allergies  Allergen Reactions   Penicillins Other (See Comments)    "Allergic," per [facility] Did it involve swelling of the face/tongue/throat, SOB, or low BP? Unk Did it involve sudden or severe rash/hives, skin peeling, or any reaction on the inside of your mouth or nose? Unk Did you need to seek medical attention at a hospital or doctor's office? Unk When did it last happen? Unk If all above answers are "NO", may proceed with cephalosporin use.    Pneumococcal Vaccine Polyvalent Other (See Comments)    "Allergic," per [facility]   Tetanus-Diphtheria Toxoids Td Other (See Comments)     "Allergic," per [facility]      I spent 35 minutes providing this consultation; time includes spent with patient/family, chart review and documentation. More than 50% of the time in this consultation was spent on care coordination  Thank you for the opportunity to participate in the care of [place] Please call our office at [phone] if we can be of additional assistance.  Note: Portions of this note were generated with Lobbyist. Dictation errors may occur despite best attempts at proofreading.  Shirley Spray, NP

## 2022-07-19 ENCOUNTER — Non-Acute Institutional Stay: Payer: Medicare Other | Admitting: Hospice

## 2022-07-19 DIAGNOSIS — K5901 Slow transit constipation: Secondary | ICD-10-CM | POA: Diagnosis not present

## 2022-07-19 DIAGNOSIS — M545 Low back pain, unspecified: Secondary | ICD-10-CM | POA: Diagnosis not present

## 2022-07-19 DIAGNOSIS — F339 Major depressive disorder, recurrent, unspecified: Secondary | ICD-10-CM

## 2022-07-19 DIAGNOSIS — Z515 Encounter for palliative care: Secondary | ICD-10-CM | POA: Diagnosis not present

## 2022-07-19 DIAGNOSIS — F0390 Unspecified dementia without behavioral disturbance: Secondary | ICD-10-CM

## 2022-07-19 DIAGNOSIS — G8929 Other chronic pain: Secondary | ICD-10-CM | POA: Diagnosis not present

## 2022-07-19 DIAGNOSIS — G4700 Insomnia, unspecified: Secondary | ICD-10-CM

## 2022-07-19 NOTE — Progress Notes (Signed)
[facility] Consult Note Telephone: [phone]  Fax: [phone]  PATIENT NAME: Shirley Wright DOB: 1925/07/18 MRN: [id number]  PRIMARY CARE PROVIDER:   Laurey Morale, MD Laurey Morale, MD [place],  [place] [address]  REFERRING PROVIDER: Laurey Morale, MD Laurey Morale, MD [place],  [place] [address]  RESPONSIBLE PARTY:[name] [id number] [name], daughter [phone] Contact Information     Name Relation Home Work Mobile   Pace,Debra Daughter   [phone]   Ronell, Pellow [phone]     Valleri, Rimel [phone]         Visit is to build trust and highlight Palliative Medicine as specialized medical care for people living with serious illness, aimed at facilitating better quality of life through symptoms relief, assisting with advance care planning and complex medical decision making.  RECOMMENDATIONS/PLAN:   Advance Care Planning/Code Status: Patient remains a DO NOT RESUSCITATE.    Goals of Care: Goals of care include to maximize quality of life and symptom management.  MOST selections include comfort measures, no feeding tube antibiotics determined when infection occurs, IV fluids for a defined trial.   Family is interested in hospice service in the future.  Visit consisted of counseling and education dealing with the complex and emotionally intense issues of symptom management and palliative care in the setting of serious and potentially life-threatening illness. Palliative care team will continue to support patient, patient's family, and medical team.    Symptom management/Plan:  Comfort care only, per family request.   Dementia: without behavior.  Patient is pleasant,  FLACC 0, memory loss/confusion at baseline, incontinent of bowel and bladder, max assist in ADLs. FAST 7A nonambulatory, impoverished thought, difficulty finding words; continue to optimize  ongoing supportive care.  Fall and safety precautions. Back pain: Continue Tylenol 500 mg 3 times daily.  Currently effective. Constipation: Continue senna S, lactulose as ordered.  Hold for diarrhea Depression: Stable. Continue Cymbalta.  Encourage socialization and participation in facility activities.  Patient enjoys watching birds through her window, looking at pictures in magazine; goes to the common area and dines with other residents.  Encourage participation in activities of choice.  Insomnia: Managed with trazodone.  Continue bedtime routine.   Follow up: Palliative care will continue to follow for complex medical decision making, advance care planning, and clarification of goals. Return 6 weeks or prn. Encouraged to call provider sooner with any concerns.  CHIEF COMPLAINT: Palliative follow up  HISTORY OF PRESENT ILLNESS:  Shirley Wright a 89 y.o. female with multiple medical problems including Alzheimer dementia, depression, hypertension. Patient is pleasant, content, denies pain/discomfort; FLACC 0. No report of fall or hospitalization since last visit. History obtained from review of EMR, discussion with primary team, family and/or patient. Records reviewed and summarized above. All 10 point systems reviewed and are negative except as documented in history of present illness above Review and summarization of Epic records shows history from other than patient.  I reviewed as needed, available labs, patient records, imaging, studies and related documents from the EMR. Palliative Care was asked to follow this patient o help address complex decision making in the context of advance care planning and goals of care clarification.    PERTINENT MEDICATIONS:  Outpatient Encounter Medications as of 07/19/2022  Medication Sig   acetaminophen (TYLENOL) 325 MG tablet Take 325 mg by mouth See admin instructions. Take 325 mg by mouth three times a day  and an additional 650 mg up to three times a  day as needed for back pain   cholecalciferol (VITAMIN D3) 25 MCG (1000 UT) tablet Take 1,000 Units by mouth daily.   DULoxetine (CYMBALTA) 30 MG capsule Take 30-60 mg by mouth See admin instructions. Take 30 mg by mouth once a day for 2 weeks, then increase to 2 capsules (60 mg) once a day   ferrous sulfate 325 (65 FE) MG tablet Take 325 mg by mouth daily with breakfast.   metoprolol succinate (TOPROL-XL) 25 MG 24 hr tablet Take 0.5 tablets (12.5 mg total) by mouth daily. Take with or immediately following a meal.   NON FORMULARY Take 120 mLs by mouth See admin instructions. MedPass: Drink 120 ml's by mouth 2 times a day   potassium chloride (KLOR-CON 10) 10 MEQ tablet Take 1 tablet (10 mEq total) by mouth daily.   traMADol (ULTRAM) 50 MG tablet Take 1 tablet (50 mg total) by mouth every 6 (six) hours as needed (for pain).   No facility-administered encounter medications on file as of 07/19/2022.    HOSPICE ELIGIBILITY/DIAGNOSIS: TBD  PAST MEDICAL HISTORY:  Past Medical History:  Diagnosis Date   Adenomatous colon polyp    tubular   Allergic rhinitis    Diverticulosis    GERD (gastroesophageal reflux disease)    Hemorrhoids    Hepatic cyst    History of colonic polyps    Hyperlipidemia    Hypertension    IBS (irritable bowel syndrome)    Osteoarthritis      ALLERGIES:  Allergies  Allergen Reactions   Penicillins Other (See Comments)    "Allergic," per [facility] Did it involve swelling of the face/tongue/throat, SOB, or low BP? Unk Did it involve sudden or severe rash/hives, skin peeling, or any reaction on the inside of your mouth or nose? Unk Did you need to seek medical attention at a hospital or doctor's office? Unk When did it last happen? Unk If all above answers are "NO", may proceed with cephalosporin use.    Pneumococcal Vaccine Polyvalent Other (See Comments)    "Allergic," per [facility]   Tetanus-Diphtheria Toxoids Td Other (See Comments)    "Allergic," per [facility]      I  spent 35 minutes providing this consultation; time includes spent with patient/family, chart review and documentation. More than 50% of the time in this consultation was spent on care coordination  Thank you for the opportunity to participate in the care of [place] Please call our office at [phone] if we can be of additional assistance.  Note: Portions of this note were generated with Lobbyist. Dictation errors may occur despite best attempts at proofreading.  Teodoro Spray, NP

## 2022-08-08 ENCOUNTER — Non-Acute Institutional Stay: Payer: Medicare Other | Admitting: Hospice

## 2022-08-08 DIAGNOSIS — M545 Low back pain, unspecified: Secondary | ICD-10-CM

## 2022-08-08 DIAGNOSIS — F339 Major depressive disorder, recurrent, unspecified: Secondary | ICD-10-CM

## 2022-08-08 DIAGNOSIS — Z515 Encounter for palliative care: Secondary | ICD-10-CM

## 2022-08-08 DIAGNOSIS — G4700 Insomnia, unspecified: Secondary | ICD-10-CM

## 2022-08-08 DIAGNOSIS — G8929 Other chronic pain: Secondary | ICD-10-CM | POA: Diagnosis not present

## 2022-08-08 DIAGNOSIS — F0390 Unspecified dementia without behavioral disturbance: Secondary | ICD-10-CM

## 2022-08-08 NOTE — Progress Notes (Signed)
[facility] Consult Note Telephone: [phone]  Fax: [phone]  PATIENT NAME: Shirley Wright DOB: 1925/10/10 MRN: [id number]  PRIMARY CARE PROVIDER:   Donnajean Lopes, MD [address] [place],  [place] [address]  REFERRING PROVIDER: Donnajean Lopes, [place],  [place] [address]  [place] [id number] [name], daughter [phone] Contact Information     Name Relation Home Work [place] Daughter   [phone]   Qunita, Aase [phone]     [place], Bielawski [phone]         Visit is to build trust and highlight Palliative Medicine as specialized medical care for people living with serious illness, aimed at facilitating better quality of life through symptoms relief, assisting with advance care planning and complex medical decision making.  RECOMMENDATIONS/PLAN:   Advance Care Planning/Code Status: Patient remains a DO NOT RESUSCITATE.    Goals of Care: Goals of care include to maximize quality of life and symptom management.  MOST selections include comfort measures, no feeding tube antibiotics determined when infection occurs, IV fluids for a defined trial.   Family is interested in hospice service in the future.  Visit consisted of counseling and education dealing with the complex and emotionally intense issues of symptom management and palliative care in the setting of serious and potentially life-threatening illness. Palliative care team will continue to support patient, patient's family, and medical team.    Symptom management/Plan:  Comfort care only, per family request.   Dementia: progressive  memory loss/confusion, incontinent of bowel and bladder, max assist in ADLs. FAST 7A FLACC 0, nonambulatory, impoverished thought, difficulty finding words; continue to optimize ongoing supportive care.   Fall and safety precautions.  Insomnia:  Take  Trazodone 50 mg at bedtime. Sleep hygiene  Back pain: Continue Tylenol 500 mg 3 times daily.  Currently effective.  Constipation: Continue senna S, lactulose as ordered.  Hold for diarrhea  Depression: Stable. Continue Cymbalta.  Encourage socialization and participation in facility activities.  Patient enjoys watching birds through her window, looking at pictures in magazine; goes to the common area and dines with other residents.  Encourage participation in activities of choice.  Follow up: Palliative care will continue to follow for complex medical decision making, advance care planning, and clarification of goals. Return 6 weeks or prn. Encouraged to call provider sooner with any concerns.  CHIEF COMPLAINT: Palliative follow up  HISTORY OF PRESENT ILLNESS:  Shirley Wright a 89 y.o. female with multiple medical problems including Alzheimer dementia, depression, hypertension. Patient is pleasant, content, denies pain/discomfort; FLACC 0. No report of fall or hospitalization since last visit. History obtained from review of EMR, discussion with primary team, family and/or patient. Records reviewed and summarized above. All 10 point systems reviewed and are negative except as documented in history of present illness above Review and summarization of Epic records shows history from other than patient.  I reviewed as needed, available labs, patient records, imaging, studies and related documents from the EMR. Palliative Care was asked to follow this patient o help address complex decision making in the context of advance care planning and goals of care clarification.    PERTINENT MEDICATIONS:  Outpatient Encounter Medications as of 08/08/2022  Medication Sig   acetaminophen (TYLENOL) 325 MG tablet Take 325 mg by mouth See admin instructions. Take 325 mg by mouth three times a day and an additional 650 mg up to three times a day as  needed for back pain   cholecalciferol (VITAMIN D3) 25 MCG  (1000 UT) tablet Take 1,000 Units by mouth daily.   DULoxetine (CYMBALTA) 30 MG capsule Take 30-60 mg by mouth See admin instructions. Take 30 mg by mouth once a day for 2 weeks, then increase to 2 capsules (60 mg) once a day   ferrous sulfate 325 (65 FE) MG tablet Take 325 mg by mouth daily with breakfast.   metoprolol succinate (TOPROL-XL) 25 MG 24 hr tablet Take 0.5 tablets (12.5 mg total) by mouth daily. Take with or immediately following a meal.   NON FORMULARY Take 120 mLs by mouth See admin instructions. MedPass: Drink 120 ml's by mouth 2 times a day   potassium chloride (KLOR-CON 10) 10 MEQ tablet Take 1 tablet (10 mEq total) by mouth daily.   traMADol (ULTRAM) 50 MG tablet Take 1 tablet (50 mg total) by mouth every 6 (six) hours as needed (for pain).   No facility-administered encounter medications on file as of 08/08/2022.    HOSPICE ELIGIBILITY/DIAGNOSIS: TBD  PAST MEDICAL HISTORY:  Past Medical History:  Diagnosis Date   Adenomatous colon polyp    tubular   Allergic rhinitis    Diverticulosis    GERD (gastroesophageal reflux disease)    Hemorrhoids    Hepatic cyst    History of colonic polyps    Hyperlipidemia    Hypertension    IBS (irritable bowel syndrome)    Osteoarthritis      ALLERGIES:  Allergies  Allergen Reactions   Penicillins Other (See Comments)    "Allergic," per [facility] Did it involve swelling of the face/tongue/throat, SOB, or low BP? Unk Did it involve sudden or severe rash/hives, skin peeling, or any reaction on the inside of your mouth or nose? Unk Did you need to seek medical attention at a hospital or doctor's office? Unk When did it last happen? Unk If all above answers are "NO", may proceed with cephalosporin use.    Pneumococcal Vaccine Polyvalent Other (See Comments)    "Allergic," per [facility]   Tetanus-Diphtheria Toxoids Td Other (See Comments)    "Allergic," per [facility]      I spent 35 minutes providing this consultation; time includes spent with  patient/family, chart review and documentation. More than 50% of the time in this consultation was spent on care coordination  Thank you for the opportunity to participate in the care of [place] Please call our office at [phone] if we can be of additional assistance.  Note: Portions of this note were generated with Lobbyist. Dictation errors may occur despite best attempts at proofreading.  Teodoro Spray, NP

## 2022-09-27 ENCOUNTER — Non-Acute Institutional Stay: Payer: Medicare Other | Admitting: Hospice

## 2022-09-27 DIAGNOSIS — K5901 Slow transit constipation: Secondary | ICD-10-CM

## 2022-09-27 DIAGNOSIS — Z515 Encounter for palliative care: Secondary | ICD-10-CM

## 2022-09-27 DIAGNOSIS — F339 Major depressive disorder, recurrent, unspecified: Secondary | ICD-10-CM

## 2022-09-27 DIAGNOSIS — G8929 Other chronic pain: Secondary | ICD-10-CM

## 2022-09-27 DIAGNOSIS — G4700 Insomnia, unspecified: Secondary | ICD-10-CM

## 2022-09-27 DIAGNOSIS — F0390 Unspecified dementia without behavioral disturbance: Secondary | ICD-10-CM

## 2022-09-27 NOTE — Progress Notes (Signed)
Therapist, nutritional Palliative Care Consult Note Telephone: [phone]  Fax: [phone]  PATIENT NAME: Shirley Wright DOB: 05-07-1926 MRN: [id number]  PRIMARY CARE PROVIDER:   Garlan Fillers, MD [address] [place],  [place] [address]  REFERRING PROVIDER: Garlan Fillers, MD [address] [place],  [place] [address]  RESPONSIBLE PARTY:[name] [id number] Wylie Hail, daughter [phone] Contact Information     Name Relation Home Work Mobile   Pace,Debra Daughter   [phone]   Azera, Mcclarin [phone]     Alaynna, Loughnane [phone]         Visit is to build trust and highlight Palliative Medicine as specialized medical care for people living with serious illness, aimed at facilitating better quality of life through symptoms relief, assisting with advance care planning and complex medical decision making.  RECOMMENDATIONS/PLAN:   Advance Care Planning/Code Status: Patient remains a DO NOT RESUSCITATE.    Goals of Care: Goals of care include to maximize quality of life and symptom management.  MOST selections include comfort measures, no feeding tube antibiotics determined when infection occurs, IV fluids for a defined trial.   Family is interested in hospice service in the future.  Visit consisted of counseling and education dealing with the complex and emotionally intense issues of symptom management and palliative care in the setting of serious and potentially life-threatening illness. Palliative care team will continue to support patient, patient's family, and medical team.    Symptom management/Plan:  Comfort care only, per family request.   Weight fairly stable 125Ibs 09/24/22  122 Ibs 08/22/22 125.6 Ibs 03/23/22  Dementia: Advanced dementia, progressive  memory loss/confusion, incontinent of bowel and bladder, max assist in ADLs. FAST 7A FLACC 0, nonambulatory, impoverished thought, difficulty finding words; continue to  optimize ongoing supportive care.   Fall and safety precautions.  Insomnia:  Take Trazodone 50 mg at bedtime. Sleep hygiene  Back pain: Continue Tylenol 500 mg 3 times daily.  Currently effective.  Constipation: Continue senna S, lactulose as ordered.  Hold for diarrhea  Depression: Stable. Dose reduction of Cymbalta to 30 mg daily.    Encourage socialization and participation in facility activities.  Patient enjoys watching birds through her window, looking at pictures in magazine; goes to the common area and dines with other residents.  Encourage participation in activities of choice.  Follow up: Palliative care will continue to follow for complex medical decision making, advance care planning, and clarification of goals. Return 6 weeks or prn. Encouraged to call provider sooner with any concerns.  CHIEF COMPLAINT: Palliative follow up  HISTORY OF PRESENT ILLNESS:  Shirley Wright a 89 y.o. female with multiple medical problems including Alzheimer dementia, depression, hypertension. Patient is pleasant, content, denies pain/discomfort; FLACC 0. No report of fall or hospitalization since last visit. History obtained from review of EMR, discussion with primary team, family and/or patient. Records reviewed and summarized above. All 10 point systems reviewed and are negative except as documented in history of present illness above Review and summarization of Epic records shows history from other than patient.  I reviewed as needed, available labs, patient records, imaging, studies and related documents from the EMR. Palliative Care was asked to follow this patient o help address complex decision making in the context of advance care planning and goals of care clarification.    PERTINENT MEDICATIONS:  Outpatient Encounter Medications as of 09/27/2022  Medication Sig   acetaminophen (TYLENOL) 325 MG tablet Take 325 mg by mouth See  admin instructions. Take 325 mg by mouth three times a day and an  additional 650 mg up to three times a day as needed for back pain   cholecalciferol (VITAMIN D3) 25 MCG (1000 UT) tablet Take 1,000 Units by mouth daily.   DULoxetine (CYMBALTA) 30 MG capsule Take 30-60 mg by mouth See admin instructions. Take 30 mg by mouth once a day for 2 weeks, then increase to 2 capsules (60 mg) once a day   ferrous sulfate 325 (65 FE) MG tablet Take 325 mg by mouth daily with breakfast.   metoprolol succinate (TOPROL-XL) 25 MG 24 hr tablet Take 0.5 tablets (12.5 mg total) by mouth daily. Take with or immediately following a meal.   NON FORMULARY Take 120 mLs by mouth See admin instructions. MedPass: Drink 120 ml's by mouth 2 times a day   potassium chloride (KLOR-CON 10) 10 MEQ tablet Take 1 tablet (10 mEq total) by mouth daily.   traMADol (ULTRAM) 50 MG tablet Take 1 tablet (50 mg total) by mouth every 6 (six) hours as needed (for pain).   No facility-administered encounter medications on file as of 09/27/2022.    HOSPICE ELIGIBILITY/DIAGNOSIS: TBD  PAST MEDICAL HISTORY:  Past Medical History:  Diagnosis Date   Adenomatous colon polyp    tubular   Allergic rhinitis    Diverticulosis    GERD (gastroesophageal reflux disease)    Hemorrhoids    Hepatic cyst    History of colonic polyps    Hyperlipidemia    Hypertension    IBS (irritable bowel syndrome)    Osteoarthritis      ALLERGIES:  Allergies  Allergen Reactions   Penicillins Other (See Comments)    "Allergic," per [facility] Did it involve swelling of the face/tongue/throat, SOB, or low BP? Unk Did it involve sudden or severe rash/hives, skin peeling, or any reaction on the inside of your mouth or nose? Unk Did you need to seek medical attention at a hospital or doctor's office? Unk When did it last happen? Unk If all above answers are "NO", may proceed with cephalosporin use.    Pneumococcal Vaccine Polyvalent Other (See Comments)    "Allergic," per [facility]   Tetanus-Diphtheria Toxoids Td Other (See Comments)     "Allergic," per [facility]      I spent 35 minutes providing this consultation; time includes spent with patient/family, chart review and documentation. More than 50% of the time in this consultation was spent on care coordination  Thank you for the opportunity to participate in the care of Naleigha B Blakeley Please call our office at [phone] if we can be of additional assistance.  Note: Portions of this note were generated with Scientist, clinical (histocompatibility and immunogenetics). Dictation errors may occur despite best attempts at proofreading.  Rosaura Carpenter, NP

## 2022-11-02 ENCOUNTER — Non-Acute Institutional Stay: Payer: Medicare Other | Admitting: Hospice

## 2022-11-02 DIAGNOSIS — G8929 Other chronic pain: Secondary | ICD-10-CM

## 2022-11-02 DIAGNOSIS — Z515 Encounter for palliative care: Secondary | ICD-10-CM

## 2022-11-02 DIAGNOSIS — K5901 Slow transit constipation: Secondary | ICD-10-CM

## 2022-11-02 DIAGNOSIS — M545 Low back pain, unspecified: Secondary | ICD-10-CM

## 2022-11-02 DIAGNOSIS — F0390 Unspecified dementia without behavioral disturbance: Secondary | ICD-10-CM

## 2022-11-02 DIAGNOSIS — F339 Major depressive disorder, recurrent, unspecified: Secondary | ICD-10-CM

## 2022-11-02 DIAGNOSIS — G4700 Insomnia, unspecified: Secondary | ICD-10-CM

## 2022-11-02 NOTE — Progress Notes (Unsigned)
Therapist, nutritional Palliative Care Consult Note Telephone: [phone]  Fax: [phone]  PATIENT NAME: Shirley Wright DOB: June 17, 1925 MRN: [id number]  PRIMARY CARE PROVIDER:   Garlan Fillers, MD [address] [place],  [place] [address]  REFERRING PROVIDER: Garlan Fillers, MD [address] [place],  [place] [address]  RESPONSIBLE PARTY:[name] [id number] Wylie Hail, daughter [phone] Contact Information     Name Relation Home Work Mobile   Pace,Debra Daughter   [phone]   Zarae, Neer [phone]     Shiloe, Blancett [phone]         Visit is to build trust and highlight Palliative Medicine as specialized medical care for people living with serious illness, aimed at facilitating better quality of life through symptoms relief, assisting with advance care planning and complex medical decision making.  RECOMMENDATIONS/PLAN:   Advance Care Planning/Code Status: Patient remains a DO NOT RESUSCITATE.    Goals of Care: Goals of care include to maximize quality of life and symptom management.  MOST selections include comfort measures, no feeding tube antibiotics determined when infection occurs, IV fluids for a defined trial.   Family is interested in hospice service in the future.  Visit consisted of counseling and education dealing with the complex and emotionally intense issues of symptom management and palliative care in the setting of serious and potentially life-threatening illness. Palliative care team will continue to support patient, patient's family, and medical team.    Symptom management/Plan:  Comfort care only, per family request.  No acuities, no hospitalization since last visit. Weight fairly stable 125Ibs 09/24/22  122 Ibs 08/22/22 125.6 Ibs 03/23/22  Dementia: Advanced dementia, FAST 7A FLACC 0, nonambulatory, impoverished thought, difficulty finding words; continue to optimize ongoing supportive care.   Fall  and safety precautions.  Insomnia:  Take Trazodone 50 mg at bedtime. Sleep hygiene  Back pain: Continue Tylenol 500 mg 3 times daily.  Currently effective.  Constipation: Continue senna S, lactulose as ordered.  Hold for diarrhea  Depression: Managed with Cymbalta.  Encourage socialization. Encourage participation in activities of choice.  Follow up: Palliative care will continue to follow for complex medical decision making, advance care planning, and clarification of goals. Return 6 weeks or prn. Encouraged to call provider sooner with any concerns.  CHIEF COMPLAINT: Palliative follow up  HISTORY OF PRESENT ILLNESS:  Shirley Wright a 89 y.o. female with multiple medical problems including Alzheimer dementia, depression, hypertension. Patient is pleasant, content, denies pain/discomfort; patient appears content, FLACC 0. No report of fall or hospitalization since last visit. History obtained from review of EMR, discussion with primary team, family and/or patient. Records reviewed and summarized above. All 10 point systems reviewed and are negative except as documented in history of present illness above Review and summarization of Epic records shows history from other than patient.   Palliative Care was asked to follow this patient o help address complex decision making in the context of advance care planning and goals of care clarification.    PERTINENT MEDICATIONS:  Outpatient Encounter Medications as of 11/02/2022  Medication Sig   acetaminophen (TYLENOL) 325 MG tablet Take 325 mg by mouth See admin instructions. Take 325 mg by mouth three times a day and an additional 650 mg up to three times a day as needed for back pain   cholecalciferol (VITAMIN D3) 25 MCG (1000 UT) tablet Take 1,000 Units by mouth daily.   DULoxetine (CYMBALTA) 30 MG capsule Take 30-60 mg by mouth See  admin instructions. Take 30 mg by mouth once a day for 2 weeks, then increase to 2 capsules (60 mg) once a day    ferrous sulfate 325 (65 FE) MG tablet Take 325 mg by mouth daily with breakfast.   metoprolol succinate (TOPROL-XL) 25 MG 24 hr tablet Take 0.5 tablets (12.5 mg total) by mouth daily. Take with or immediately following a meal.   NON FORMULARY Take 120 mLs by mouth See admin instructions. MedPass: Drink 120 ml's by mouth 2 times a day   potassium chloride (KLOR-CON 10) 10 MEQ tablet Take 1 tablet (10 mEq total) by mouth daily.   traMADol (ULTRAM) 50 MG tablet Take 1 tablet (50 mg total) by mouth every 6 (six) hours as needed (for pain).   No facility-administered encounter medications on file as of 11/02/2022.    HOSPICE ELIGIBILITY/DIAGNOSIS: TBD  PAST MEDICAL HISTORY:  Past Medical History:  Diagnosis Date   Adenomatous colon polyp    tubular   Allergic rhinitis    Diverticulosis    GERD (gastroesophageal reflux disease)    Hemorrhoids    Hepatic cyst    History of colonic polyps    Hyperlipidemia    Hypertension    IBS (irritable bowel syndrome)    Osteoarthritis      ALLERGIES:  Allergies  Allergen Reactions   Penicillins Other (See Comments)    "Allergic," per [facility] Did it involve swelling of the face/tongue/throat, SOB, or low BP? Unk Did it involve sudden or severe rash/hives, skin peeling, or any reaction on the inside of your mouth or nose? Unk Did you need to seek medical attention at a hospital or doctor's office? Unk When did it last happen? Unk If all above answers are "NO", may proceed with cephalosporin use.    Pneumococcal Vaccine Polyvalent Other (See Comments)    "Allergic," per [facility]   Tetanus-Diphtheria Toxoids Td Other (See Comments)    "Allergic," per [facility]      I spent 35 minutes providing this consultation; time includes spent with patient/family, chart review and documentation. More than 50% of the time in this consultation was spent on care coordination  Thank you for the opportunity to participate in the care of Shirley Wright Please call our office  at [phone] if we can be of additional assistance.  Note: Portions of this note were generated with Scientist, clinical (histocompatibility and immunogenetics). Dictation errors may occur despite best attempts at proofreading.  Rosaura Carpenter, NP
